# Patient Record
Sex: Female | Born: 1935
Health system: Southern US, Community
[De-identification: ages and names within clinical notes are randomized; demographics above are authoritative.]

## PROBLEM LIST (undated history)

## (undated) DIAGNOSIS — F419 Anxiety disorder, unspecified: Secondary | ICD-10-CM

## (undated) DIAGNOSIS — F0390 Unspecified dementia without behavioral disturbance: Secondary | ICD-10-CM

## (undated) HISTORY — PX: COLON SURGERY: SHX602

## (undated) HISTORY — DX: Unspecified dementia, unspecified severity, without behavioral disturbance, psychotic disturbance, mood disturbance, and anxiety: F03.90

## (undated) HISTORY — DX: Anxiety disorder, unspecified: F41.9

## (undated) HISTORY — PX: JOINT REPLACEMENT: SHX530

---
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
## 1999-05-13 ENCOUNTER — Other Ambulatory Visit: Admission: RE | Admit: 1999-05-13 | Discharge: 1999-05-13 | Payer: Self-pay | Admitting: Obstetrics and Gynecology

## 2000-06-20 ENCOUNTER — Encounter: Admission: RE | Admit: 2000-06-20 | Discharge: 2000-06-20 | Payer: Self-pay | Admitting: Family Medicine

## 2000-06-20 ENCOUNTER — Encounter: Payer: Self-pay | Admitting: Family Medicine

## 2000-11-03 ENCOUNTER — Ambulatory Visit (HOSPITAL_COMMUNITY): Admission: RE | Admit: 2000-11-03 | Discharge: 2000-11-03 | Payer: Self-pay | Admitting: Obstetrics and Gynecology

## 2001-06-26 ENCOUNTER — Encounter: Admission: RE | Admit: 2001-06-26 | Discharge: 2001-06-26 | Payer: Self-pay | Admitting: Family Medicine

## 2001-06-26 ENCOUNTER — Encounter: Payer: Self-pay | Admitting: Family Medicine

## 2002-07-06 ENCOUNTER — Encounter: Payer: Self-pay | Admitting: Family Medicine

## 2002-07-06 ENCOUNTER — Encounter: Admission: RE | Admit: 2002-07-06 | Discharge: 2002-07-06 | Payer: Self-pay | Admitting: Family Medicine

## 2003-07-09 ENCOUNTER — Encounter: Admission: RE | Admit: 2003-07-09 | Discharge: 2003-07-09 | Payer: Self-pay | Admitting: Family Medicine

## 2003-07-09 ENCOUNTER — Encounter: Payer: Self-pay | Admitting: Family Medicine

## 2004-08-28 ENCOUNTER — Ambulatory Visit (HOSPITAL_COMMUNITY): Admission: RE | Admit: 2004-08-28 | Discharge: 2004-08-28 | Payer: Self-pay | Admitting: Family Medicine

## 2011-02-23 ENCOUNTER — Other Ambulatory Visit (HOSPITAL_COMMUNITY): Payer: Self-pay | Admitting: Family Medicine

## 2011-02-23 DIAGNOSIS — Z1231 Encounter for screening mammogram for malignant neoplasm of breast: Secondary | ICD-10-CM

## 2011-03-05 ENCOUNTER — Ambulatory Visit (HOSPITAL_COMMUNITY): Payer: Medicare HMO | Attending: Family Medicine

## 2011-08-09 ENCOUNTER — Emergency Department (HOSPITAL_COMMUNITY)
Admission: EM | Admit: 2011-08-09 | Discharge: 2011-08-10 | Disposition: A | Discharge status: Home / Self Care | Payer: Medicare HMO | Attending: Emergency Medicine | Admitting: Emergency Medicine

## 2011-08-09 DIAGNOSIS — Z79899 Other long term (current) drug therapy: Secondary | ICD-10-CM | POA: Insufficient documentation

## 2011-08-09 DIAGNOSIS — F0390 Unspecified dementia without behavioral disturbance: Secondary | ICD-10-CM | POA: Insufficient documentation

## 2012-06-22 ENCOUNTER — Other Ambulatory Visit: Payer: Self-pay | Admitting: Family Medicine

## 2012-06-22 DIAGNOSIS — Z1231 Encounter for screening mammogram for malignant neoplasm of breast: Secondary | ICD-10-CM

## 2012-06-22 DIAGNOSIS — M810 Age-related osteoporosis without current pathological fracture: Secondary | ICD-10-CM

## 2012-07-11 ENCOUNTER — Ambulatory Visit: Payer: Medicare HMO

## 2012-07-11 ENCOUNTER — Other Ambulatory Visit: Payer: Medicare HMO

## 2012-08-02 ENCOUNTER — Ambulatory Visit
Admission: RE | Admit: 2012-08-02 | Discharge: 2012-08-02 | Disposition: A | Discharge status: Home / Self Care | Payer: Medicare HMO | Source: Ambulatory Visit | Attending: Family Medicine | Admitting: Family Medicine

## 2012-08-02 ENCOUNTER — Other Ambulatory Visit: Payer: Medicare HMO

## 2012-08-02 DIAGNOSIS — M810 Age-related osteoporosis without current pathological fracture: Secondary | ICD-10-CM

## 2012-08-02 DIAGNOSIS — Z1231 Encounter for screening mammogram for malignant neoplasm of breast: Secondary | ICD-10-CM

## 2014-01-01 DIAGNOSIS — D509 Iron deficiency anemia, unspecified: Secondary | ICD-10-CM | POA: Insufficient documentation

## 2014-01-01 DIAGNOSIS — Z79899 Other long term (current) drug therapy: Secondary | ICD-10-CM | POA: Insufficient documentation

## 2014-01-01 DIAGNOSIS — F419 Anxiety disorder, unspecified: Secondary | ICD-10-CM | POA: Insufficient documentation

## 2015-09-22 ENCOUNTER — Ambulatory Visit: Payer: Self-pay | Admitting: Family Medicine

## 2015-10-15 ENCOUNTER — Encounter: Payer: Self-pay | Admitting: Family Medicine

## 2015-10-15 ENCOUNTER — Ambulatory Visit (INDEPENDENT_AMBULATORY_CARE_PROVIDER_SITE_OTHER): Payer: Medicare HMO | Admitting: Family Medicine

## 2015-10-15 ENCOUNTER — Ambulatory Visit (INDEPENDENT_AMBULATORY_CARE_PROVIDER_SITE_OTHER): Payer: Medicare HMO

## 2015-10-15 VITALS — BP 122/72 | HR 80 | Temp 97.9°F | Ht 64.0 in | Wt 144.2 lb

## 2015-10-15 DIAGNOSIS — G4700 Insomnia, unspecified: Secondary | ICD-10-CM | POA: Diagnosis not present

## 2015-10-15 DIAGNOSIS — M545 Low back pain, unspecified: Secondary | ICD-10-CM

## 2015-10-15 DIAGNOSIS — S32000A Wedge compression fracture of unspecified lumbar vertebra, initial encounter for closed fracture: Secondary | ICD-10-CM

## 2015-10-15 DIAGNOSIS — F0391 Unspecified dementia with behavioral disturbance: Secondary | ICD-10-CM

## 2015-10-15 DIAGNOSIS — F329 Major depressive disorder, single episode, unspecified: Secondary | ICD-10-CM | POA: Diagnosis not present

## 2015-10-15 DIAGNOSIS — F0390 Unspecified dementia without behavioral disturbance: Secondary | ICD-10-CM | POA: Insufficient documentation

## 2015-10-15 DIAGNOSIS — F32A Depression, unspecified: Secondary | ICD-10-CM

## 2015-10-15 DIAGNOSIS — F03918 Unspecified dementia, unspecified severity, with other behavioral disturbance: Secondary | ICD-10-CM | POA: Insufficient documentation

## 2015-10-15 MED ORDER — HALOPERIDOL 0.5 MG PO TABS: ORAL_TABLET | ORAL | Status: DC

## 2015-10-15 MED ORDER — MELOXICAM 7.5 MG PO TABS: 7.5000 mg | ORAL_TABLET | Freq: Every day | ORAL | Status: DC

## 2015-10-15 MED ORDER — SERTRALINE HCL 100 MG PO TABS: 100.0000 mg | ORAL_TABLET | Freq: Two times a day (BID) | ORAL | Status: DC

## 2015-10-15 MED ORDER — DONEPEZIL HCL 10 MG PO TABS: 10.0000 mg | ORAL_TABLET | Freq: Every day | ORAL | Status: DC

## 2015-10-15 MED ORDER — TRAZODONE HCL 50 MG PO TABS: ORAL_TABLET | ORAL | Status: DC

## 2015-10-15 NOTE — Assessment & Plan Note (Signed)
Refill medications and further evaluate at next visit. Request records from previous physician

## 2015-10-15 NOTE — Progress Notes (Signed)
BP 122/72 mmHg  Pulse 80  Temp(Src) 97.9 F (36.6 C) (Oral)  Ht 5\' 4"  (1.626 m)  Wt 144 lb 3.2 oz (65.409 kg)  BMI 24.74 kg/m2   Subjective:    Patient ID: Mallory Steele, female    DOB: 1932/06/14, 81 y.o.   MRN: [id number]  HPI: Mallory Steele is a 81 y.o. female presenting on 10/15/2015 for New Patient (Initial Visit); Fall; and Medication Refill   HPI Fall and back pain Patient comes in today as a new patient for a fall and back pain that is persistent and is not improving despite Tylenol use. Her daughter is with her today and is giving a lot of the history because she has dementia to where she can't remember things. She denies any pain shooting down her legs or tingling or numbness in her legs or weakness. The pain is midline and lower lumbar/sacral. She fell on her buttocks when she fell a week ago. She said she was trying to grab something off a top shelf and as she was pulling it out she fell backwards.  Dementia and behavioral problems and insomnia Patient also has a history of dementia and behavioral problems and insomnia for which she needs a refill today. We will do a further reevaluation of this when she comes for her full exam but did not want her to run out of medication so we'll do a refill at this point. Her daughter she's been doing really well on these medications.  Relevant past medical, surgical, family and social history reviewed and updated as indicated. Interim medical history since our last visit reviewed. Allergies and medications reviewed and updated.  Review of Systems  Constitutional: Negative for fever and chills.  HENT: Negative for congestion, ear discharge and ear pain.   Eyes: Negative for redness and visual disturbance.  Respiratory: Negative for chest tightness and shortness of breath.   Cardiovascular: Negative for chest pain and leg swelling.  Genitourinary: Negative for dysuria and difficulty urinating.  Musculoskeletal: Positive for back  pain. Negative for gait problem.  Skin: Negative for rash.  Neurological: Negative for light-headedness and headaches.  Psychiatric/Behavioral: Positive for confusion, sleep disturbance and decreased concentration. Negative for suicidal ideas, behavioral problems, self-injury, dysphoric mood and agitation. The patient is not nervous/anxious.   All other systems reviewed and are negative.   Per HPI unless specifically indicated above  Social History   Social History  . Marital Status: Widowed    Spouse Name: N/A  . Number of Children: N/A  . Years of Education: N/A   Occupational History  . Not on file.   Social History Main Topics  . Smoking status: Former Smoker -- 0.25 packs/day for 3 years    Quit date: 09/22/1985  . Smokeless tobacco: Never Used  . Alcohol Use: 0.6 oz/week    1 Glasses of wine per week  . Drug Use: No  . Sexual Activity: No   Other Topics Concern  . Not on file   Social History Narrative    Past Surgical History  Procedure Laterality Date  . Appendectomy  1947  . Colon surgery  [phone]  . Joint replacement      knee replaced    Family History  Problem Relation Age of Onset  . Arthritis Mother   . Cancer Mother   . Arthritis Father   . Asthma Father   . COPD Father   . Arthritis Sister   . Birth defects Sister   .  Diabetes Sister   . Hearing loss Sister   . Mental retardation Sister   . Arthritis Brother   . Early death Brother   . Heart disease Brother   . Hypertension Brother       Medication List       This list is accurate as of: 10/15/15  2:59 PM.  Always use your most recent med list.               donepezil 10 MG tablet  Commonly known as:  ARICEPT  Take 1 tablet (10 mg total) by mouth daily.     haloperidol 0.5 MG tablet  Commonly known as:  HALDOL  TAKE 1 TABLET TWICE A DAY AS NEEDED FOR SEDATION     meloxicam 7.5 MG tablet  Commonly known as:  MOBIC  Take 1 tablet (7.5 mg total) by mouth daily.      sertraline 100 MG tablet  Commonly known as:  ZOLOFT  Take 1 tablet (100 mg total) by mouth 2 (two) times daily.     traZODone 50 MG tablet  Commonly known as:  DESYREL  TAKE 1 TO 2 TABLETS AT BEDTIME AS NEEDED FOR SLEEP           Objective:    BP 122/72 mmHg  Pulse 80  Temp(Src) 97.9 F (36.6 C) (Oral)  Ht 5\' 4"  (1.626 m)  Wt 144 lb 3.2 oz (65.409 kg)  BMI 24.74 kg/m2  Wt Readings from Last 3 Encounters:  10/15/15 144 lb 3.2 oz (65.409 kg)    Physical Exam  Constitutional: She appears well-developed and well-nourished. No distress.  Eyes: Conjunctivae and EOM are normal. Pupils are equal, round, and reactive to light.  Cardiovascular: Normal rate, regular rhythm, normal heart sounds and intact distal pulses.   No murmur heard. Pulmonary/Chest: Effort normal and breath sounds normal. No respiratory distress. She has no wheezes.  Musculoskeletal: Normal range of motion. She exhibits no edema.       Lumbar back: She exhibits tenderness and bony tenderness (midline tenderness over L5-S1 region, negative straight leg raise bilaterally.). She exhibits normal range of motion, no swelling, no edema, no deformity and no spasm.  Neurological: She is alert. Coordination normal.  Skin: Skin is warm and dry. No rash noted. She is not diaphoretic.  Psychiatric: She has a normal mood and affect. Her speech is normal and behavior is normal. Judgment and thought content normal.  Nursing note and vitals reviewed.  Lumbar x-ray: X-ray appears to show 3 levels of compression fracture including L2 L3 L4, await final read by radiology. No results found for this or any previous visit.    Assessment & Plan:   Problem List Items Addressed This Visit      Nervous and Auditory   Dementia    Refill medications and further evaluate at next visit. Request records from previous physician      Relevant Medications   traZODone (DESYREL) 50 MG tablet   sertraline (ZOLOFT) 100 MG tablet    haloperidol (HALDOL) 0.5 MG tablet   donepezil (ARICEPT) 10 MG tablet     Other   Insomnia    Refill medications and further evaluate at next visit. Request records from previous physician      Relevant Medications   traZODone (DESYREL) 50 MG tablet   Depression    Refill medications and further evaluate at next visit. Request records from previous physician      Relevant Medications   traZODone (DESYREL) 50  MG tablet   sertraline (ZOLOFT) 100 MG tablet    Other Visit Diagnoses    Midline low back pain without sciatica    -  Primary    Concern for possible compression fracture and will do x-ray of lower spine.    Relevant Medications    meloxicam (MOBIC) 7.5 MG tablet    Other Relevant Orders    DG Lumbar Spine 2-3 Views (Completed)    Ambulatory referral to Orthopedic Surgery    Compression fracture of lumbar vertebra, closed, initial encounter ([facility])        Based on x-ray and no previous imaging we do not know if this is a new or old injury but with a recent fall and pain we will send to orthopedic    Relevant Orders    Ambulatory referral to Orthopedic Surgery        Follow up plan: Return if symptoms worsen or fail to improve.  Caryl Pina, MD [facility] 10/15/2015, 2:59 PM

## 2015-11-14 ENCOUNTER — Ambulatory Visit: Payer: Medicare HMO | Admitting: Family Medicine

## 2015-12-02 ENCOUNTER — Ambulatory Visit: Payer: Medicare HMO | Admitting: Family Medicine

## 2016-02-12 ENCOUNTER — Other Ambulatory Visit: Payer: Self-pay | Admitting: Family Medicine

## 2016-02-12 NOTE — Telephone Encounter (Signed)
Last seen 10/15/15 Dr Dettinger

## 2016-02-26 ENCOUNTER — Other Ambulatory Visit: Payer: Self-pay

## 2016-02-26 DIAGNOSIS — F0391 Unspecified dementia with behavioral disturbance: Secondary | ICD-10-CM

## 2016-02-26 MED ORDER — DONEPEZIL HCL 10 MG PO TABS: 10.0000 mg | ORAL_TABLET | Freq: Every day | ORAL | Status: DC

## 2016-04-19 ENCOUNTER — Other Ambulatory Visit: Payer: Self-pay | Admitting: Family Medicine

## 2016-04-20 ENCOUNTER — Ambulatory Visit: Payer: Medicare HMO | Admitting: Family Medicine

## 2016-04-20 NOTE — Telephone Encounter (Signed)
Patient needs an appointment before next refill

## 2016-04-29 ENCOUNTER — Ambulatory Visit (INDEPENDENT_AMBULATORY_CARE_PROVIDER_SITE_OTHER): Payer: Medicare HMO | Admitting: Family Medicine

## 2016-04-29 ENCOUNTER — Other Ambulatory Visit: Payer: Self-pay | Admitting: *Deleted

## 2016-04-29 ENCOUNTER — Encounter: Payer: Self-pay | Admitting: Family Medicine

## 2016-04-29 VITALS — BP 127/68 | HR 66 | Temp 97.0°F | Ht 64.0 in | Wt 144.8 lb

## 2016-04-29 DIAGNOSIS — F329 Major depressive disorder, single episode, unspecified: Secondary | ICD-10-CM | POA: Diagnosis not present

## 2016-04-29 DIAGNOSIS — F0391 Unspecified dementia with behavioral disturbance: Secondary | ICD-10-CM

## 2016-04-29 DIAGNOSIS — Z13220 Encounter for screening for lipoid disorders: Secondary | ICD-10-CM

## 2016-04-29 DIAGNOSIS — Z131 Encounter for screening for diabetes mellitus: Secondary | ICD-10-CM | POA: Diagnosis not present

## 2016-04-29 DIAGNOSIS — F32A Depression, unspecified: Secondary | ICD-10-CM

## 2016-04-29 MED ORDER — HALOPERIDOL 0.5 MG PO TABS: ORAL_TABLET | ORAL | Status: DC

## 2016-04-30 ENCOUNTER — Telehealth: Payer: Self-pay | Admitting: *Deleted

## 2016-04-30 DIAGNOSIS — T496X1A Poisoning by otorhinolaryngological drugs and preparations, accidental (unintentional), initial encounter: Secondary | ICD-10-CM | POA: Diagnosis not present

## 2016-04-30 DIAGNOSIS — Z87891 Personal history of nicotine dependence: Secondary | ICD-10-CM | POA: Diagnosis not present

## 2016-04-30 DIAGNOSIS — Z79899 Other long term (current) drug therapy: Secondary | ICD-10-CM | POA: Diagnosis not present

## 2016-04-30 DIAGNOSIS — H10211 Acute toxic conjunctivitis, right eye: Secondary | ICD-10-CM | POA: Diagnosis not present

## 2016-04-30 LAB — CMP14+EGFR
ALT: 11 IU/L (ref 0–32)
AST: 21 IU/L (ref 0–40)
Albumin/Globulin Ratio: 1.7 (ref 1.2–2.2)
Albumin: 4.3 g/dL (ref 3.5–4.7)
Alkaline Phosphatase: 161 IU/L — ABNORMAL HIGH (ref 39–117)
BUN/Creatinine Ratio: 12 (ref 12–28)
BUN: 14 mg/dL (ref 8–27)
Bilirubin Total: <0.2 mg/dL (ref 0.0–1.2)
CALCIUM: 8.5 mg/dL — AB (ref 8.7–10.3)
CO2: 19 mmol/L (ref 18–29)
CREATININE: 1.14 mg/dL — AB (ref 0.57–1.00)
Chloride: 104 mmol/L (ref 96–106)
GFR calc Af Amer: 52 mL/min/{1.73_m2} — ABNORMAL LOW (ref >59)
GFR, EST NON AFRICAN AMERICAN: 45 mL/min/{1.73_m2} — AB (ref >59)
GLOBULIN, TOTAL: 2.5 g/dL (ref 1.5–4.5)
GLUCOSE: 76 mg/dL (ref 65–99)
Potassium: 4.9 mmol/L (ref 3.5–5.2)
SODIUM: 138 mmol/L (ref 134–144)
Total Protein: 6.8 g/dL (ref 6.0–8.5)

## 2016-04-30 LAB — LIPID PANEL
CHOL/HDL RATIO: 1.8 ratio (ref 0.0–4.4)
CHOLESTEROL TOTAL: 106 mg/dL (ref 100–199)
HDL: 60 mg/dL (ref >39)
LDL CALC: 23 mg/dL (ref 0–99)
TRIGLYCERIDES: 115 mg/dL (ref 0–149)
VLDL CHOLESTEROL CAL: 23 mg/dL (ref 5–40)

## 2016-04-30 LAB — CBC WITH DIFFERENTIAL/PLATELET
BASOS: 0 %
Basophils Absolute: 0 10*3/uL (ref 0.0–0.2)
EOS (ABSOLUTE): 0.1 10*3/uL (ref 0.0–0.4)
EOS: 1 %
HEMATOCRIT: 36.2 % (ref 34.0–46.6)
HEMOGLOBIN: 11.3 g/dL (ref 11.1–15.9)
Immature Grans (Abs): 0 10*3/uL (ref 0.0–0.1)
Immature Granulocytes: 0 %
LYMPHS ABS: 2.2 10*3/uL (ref 0.7–3.1)
Lymphs: 30 %
MCH: 27.7 pg (ref 26.6–33.0)
MCHC: 31.2 g/dL — AB (ref 31.5–35.7)
MCV: 89 fL (ref 79–97)
MONOCYTES: 10 %
MONOS ABS: 0.7 10*3/uL (ref 0.1–0.9)
NEUTROS ABS: 4.1 10*3/uL (ref 1.4–7.0)
Neutrophils: 59 %
Platelets: 188 10*3/uL (ref 150–379)
RBC: 4.08 x10E6/uL (ref 3.77–5.28)
RDW: 15.2 % (ref 12.3–15.4)
WBC: 7.1 10*3/uL (ref 3.4–10.8)

## 2016-04-30 LAB — TSH: TSH: 2.1 u[IU]/mL (ref 0.450–4.500)

## 2016-04-30 NOTE — Telephone Encounter (Signed)
Patients daughter called stating that her mother has been using ear drops in her eyes for that last few days and her eyes are red and swollen. Daughter advised that she should take her to the eye Dr for them to evaluate patients eyes.

## 2016-05-04 NOTE — Progress Notes (Signed)
BP 127/68 mmHg  Pulse 66  Temp(Src) 97 F (36.1 C) (Oral)  Ht '5\' 4"'  (1.626 m)  Wt 144 lb 12.8 oz (65.681 kg)  BMI 24.84 kg/m2   Subjective:    Patient ID: Mallory Steele, female    DOB: August 23, 1932, 82 y.o.   MRN: [id number]  HPI: Mallory Steele is a 82 y.o. female presenting on 04/29/2016 for Establish Care   HPI Depression and anxiety Patient comes in to establish care today for anxiety and depression. She has also had dementia and has been on Zoloft and trazodone and Haldol and Aricept. She has been doing well on these medications per family that is here with her and denies any major issues. He suicidal ideations or thoughts of hurting herself. Patient also comes in today for screening labs. She also takes trazodone for sleep but it seems to be doing pretty well for her right now.  Relevant past medical, surgical, family and social history reviewed and updated as indicated. Interim medical history since our last visit reviewed. Allergies and medications reviewed and updated.  Review of Systems  Constitutional: Negative for fever and chills.  HENT: Negative for congestion, ear discharge and ear pain.   Eyes: Negative for redness and visual disturbance.  Respiratory: Negative for chest tightness and shortness of breath.   Cardiovascular: Negative for chest pain and leg swelling.  Genitourinary: Negative for dysuria and difficulty urinating.  Musculoskeletal: Negative for back pain and gait problem.  Skin: Negative for rash.  Neurological: Negative for dizziness, light-headedness and headaches.  Psychiatric/Behavioral: Positive for sleep disturbance, dysphoric mood and decreased concentration. Negative for suicidal ideas, behavioral problems, self-injury and agitation. The patient is nervous/anxious.   All other systems reviewed and are negative.   Per HPI unless specifically indicated above     Medication List       This list is accurate as of: 04/29/16 11:59 PM.  Always use  your most recent med list.               donepezil 10 MG tablet  Commonly known as:  ARICEPT  Take 1 tablet (10 mg total) by mouth daily.     haloperidol 0.5 MG tablet  Commonly known as:  HALDOL  TAKE 1 TABLET TWICE A DAY AS NEEDED FOR SEDATION     meloxicam 7.5 MG tablet  Commonly known as:  MOBIC  Take 1 tablet (7.5 mg total) by mouth daily.     sertraline 100 MG tablet  Commonly known as:  ZOLOFT  TAKE 1 TABLET (100 MG TOTAL) BY MOUTH 2 (TWO) TIMES DAILY.     traZODone 50 MG tablet  Commonly known as:  DESYREL  TAKE 1 TO 2 TABLETS AT BEDTIME AS NEEDED FOR SLEEP           Objective:    BP 127/68 mmHg  Pulse 66  Temp(Src) 97 F (36.1 C) (Oral)  Ht '5\' 4"'  (1.626 m)  Wt 144 lb 12.8 oz (65.681 kg)  BMI 24.84 kg/m2  Wt Readings from Last 3 Encounters:  04/29/16 144 lb 12.8 oz (65.681 kg)  10/15/15 144 lb 3.2 oz (65.409 kg)    Physical Exam  Constitutional: She is oriented to person, place, and time. She appears well-developed and well-nourished. No distress.  Eyes: Conjunctivae and EOM are normal. Pupils are equal, round, and reactive to light.  Neck: Neck supple. No thyromegaly present.  Cardiovascular: Normal rate, regular rhythm, normal heart sounds and intact distal pulses.  No murmur heard. Pulmonary/Chest: Effort normal and breath sounds normal. No respiratory distress. She has no wheezes.  Musculoskeletal: Normal range of motion. She exhibits no edema or tenderness.  Lymphadenopathy:    She has no cervical adenopathy.  Neurological: She is alert and oriented to person, place, and time. Coordination normal.  Skin: Skin is warm and dry. No rash noted. She is not diaphoretic.  Psychiatric: Her behavior is normal. Thought content normal. Her mood appears anxious. She exhibits a depressed mood. She expresses no suicidal ideation. She expresses no suicidal plans.  Nursing note and vitals reviewed.     Assessment & Plan:   Problem List Items Addressed This  Visit      Other   Depression - Primary   Relevant Orders   TSH (Completed)   CBC with Differential/Platelet (Completed)    Other Visit Diagnoses    Screening for diabetes mellitus        Relevant Orders    CMP14+EGFR (Completed)    Screening for cholesterol level        Relevant Orders    Lipid panel (Completed)        Follow up plan: No Follow-up on file.  Counseling provided for all of the vaccine components Orders Placed This Encounter  Procedures  . CMP14+EGFR  . Lipid panel  . TSH  . CBC with Differential/Platelet    Caryl Pina, MD [facility] 04/29/2016, 4:58 AM

## 2016-05-06 DIAGNOSIS — H2513 Age-related nuclear cataract, bilateral: Secondary | ICD-10-CM | POA: Diagnosis not present

## 2016-05-06 DIAGNOSIS — H25813 Combined forms of age-related cataract, bilateral: Secondary | ICD-10-CM | POA: Diagnosis not present

## 2016-05-06 DIAGNOSIS — H40033 Anatomical narrow angle, bilateral: Secondary | ICD-10-CM | POA: Diagnosis not present

## 2016-05-30 ENCOUNTER — Other Ambulatory Visit: Payer: Self-pay | Admitting: Family Medicine

## 2016-06-07 DIAGNOSIS — H25813 Combined forms of age-related cataract, bilateral: Secondary | ICD-10-CM | POA: Diagnosis not present

## 2016-06-18 ENCOUNTER — Ambulatory Visit: Payer: Medicare HMO | Admitting: Pediatrics

## 2016-06-26 ENCOUNTER — Other Ambulatory Visit: Payer: Self-pay | Admitting: Family Medicine

## 2016-06-26 DIAGNOSIS — F0391 Unspecified dementia with behavioral disturbance: Secondary | ICD-10-CM

## 2016-06-28 DIAGNOSIS — H25811 Combined forms of age-related cataract, right eye: Secondary | ICD-10-CM | POA: Diagnosis not present

## 2016-06-28 DIAGNOSIS — H2511 Age-related nuclear cataract, right eye: Secondary | ICD-10-CM | POA: Diagnosis not present

## 2016-07-18 ENCOUNTER — Other Ambulatory Visit: Payer: Self-pay | Admitting: Family Medicine

## 2016-08-02 ENCOUNTER — Other Ambulatory Visit: Payer: Self-pay | Admitting: Family Medicine

## 2016-08-02 DIAGNOSIS — F0391 Unspecified dementia with behavioral disturbance: Secondary | ICD-10-CM

## 2016-08-07 ENCOUNTER — Other Ambulatory Visit: Payer: Self-pay | Admitting: Family Medicine

## 2016-08-07 DIAGNOSIS — F0391 Unspecified dementia with behavioral disturbance: Secondary | ICD-10-CM

## 2016-09-08 ENCOUNTER — Other Ambulatory Visit: Payer: Self-pay | Admitting: Family Medicine

## 2016-09-08 ENCOUNTER — Other Ambulatory Visit: Payer: Self-pay

## 2016-09-08 DIAGNOSIS — F0391 Unspecified dementia with behavioral disturbance: Secondary | ICD-10-CM

## 2016-09-08 DIAGNOSIS — F03918 Unspecified dementia, unspecified severity, with other behavioral disturbance: Secondary | ICD-10-CM

## 2016-09-08 MED ORDER — SERTRALINE HCL 100 MG PO TABS: ORAL_TABLET | ORAL | 0 refills | Status: DC

## 2016-09-18 ENCOUNTER — Other Ambulatory Visit: Payer: Self-pay | Admitting: Family Medicine

## 2016-09-18 DIAGNOSIS — F0391 Unspecified dementia with behavioral disturbance: Secondary | ICD-10-CM

## 2016-09-18 DIAGNOSIS — F03918 Unspecified dementia, unspecified severity, with other behavioral disturbance: Secondary | ICD-10-CM

## 2016-10-06 ENCOUNTER — Ambulatory Visit (INDEPENDENT_AMBULATORY_CARE_PROVIDER_SITE_OTHER): Payer: Commercial Managed Care - HMO | Admitting: Family Medicine

## 2016-10-06 ENCOUNTER — Encounter: Payer: Self-pay | Admitting: Family Medicine

## 2016-10-06 VITALS — BP 128/69 | HR 65 | Temp 97.9°F | Ht 64.0 in | Wt 145.5 lb

## 2016-10-06 DIAGNOSIS — H00014 Hordeolum externum left upper eyelid: Secondary | ICD-10-CM | POA: Diagnosis not present

## 2016-10-06 MED ORDER — CEPHALEXIN 500 MG PO CAPS: 500.0000 mg | ORAL_CAPSULE | Freq: Two times a day (BID) | ORAL | 0 refills | Status: DC

## 2016-10-06 NOTE — Progress Notes (Signed)
BP 128/69   Pulse 65   Temp 97.9 F (36.6 C) (Oral)   Ht 5\' 4"  (1.626 m)   Wt 145 lb 8 oz (66 kg)   BMI 24.98 kg/m    Subjective:    Patient ID: Mallory Steele, female    DOB: 1931/11/23, 82 y.o.   MRN: [id number]  HPI: Mallory Steele is a 82 y.o. female presenting on 10/06/2016 for Stye on left eye (has been putting warm compresses and a heated eye mask on it)   HPI Left eye stye Patient comes in today with a stye on her left upper eyelid that's been there for about a week. She has been using warm compresses and heated eye mask on it to see if it would open up but it does not seem to want to. She denies any fevers or chills or visual issues. She denies any pain with eye movement. The area of erythema and swelling is starting to get larger on the eyelid and that's what brought him in today.  Relevant past medical, surgical, family and social history reviewed and updated as indicated. Interim medical history since our last visit reviewed. Allergies and medications reviewed and updated.  Review of Systems  Constitutional: Negative for chills and fever.  HENT: Negative for congestion, ear discharge and ear pain.   Eyes: Negative for pain, discharge, redness and visual disturbance.  Respiratory: Negative for chest tightness and shortness of breath.   Cardiovascular: Negative for chest pain and leg swelling.  Genitourinary: Negative for difficulty urinating and dysuria.  Musculoskeletal: Negative for back pain and gait problem.  Skin: Negative for rash.  Neurological: Negative for light-headedness and headaches.  Psychiatric/Behavioral: Negative for agitation and behavioral problems.  All other systems reviewed and are negative.   Per HPI unless specifically indicated above     Objective:    BP 128/69   Pulse 65   Temp 97.9 F (36.6 C) (Oral)   Ht 5\' 4"  (1.626 m)   Wt 145 lb 8 oz (66 kg)   BMI 24.98 kg/m   Wt Readings from Last 3 Encounters:  10/06/16 145 lb 8 oz (66  kg)  04/29/16 144 lb 12.8 oz (65.7 kg)  10/15/15 144 lb 3.2 oz (65.4 kg)    Physical Exam  Constitutional: She is oriented to person, place, and time. She appears well-developed and well-nourished. No distress.  Eyes: Conjunctivae and EOM are normal. Pupils are equal, round, and reactive to light. Right eye exhibits no discharge and no hordeolum. Left eye exhibits hordeolum. Left eye exhibits no discharge.    Cardiovascular: Normal rate, regular rhythm, normal heart sounds and intact distal pulses.   No murmur heard. Pulmonary/Chest: Effort normal and breath sounds normal. No respiratory distress. She has no wheezes.  Musculoskeletal: Normal range of motion. She exhibits no edema or tenderness.  Neurological: She is alert and oriented to person, place, and time. Coordination normal.  Skin: Skin is warm and dry. No rash noted. She is not diaphoretic.  Psychiatric: She has a normal mood and affect. Her behavior is normal.  Nursing note and vitals reviewed.     Assessment & Plan:   Problem List Items Addressed This Visit    None    Visit Diagnoses    Hordeolum externum of left upper eyelid    -  Primary   Hyzaar tried warm compresses for 2 weeks, will send antibiotics, starting to develop into a cellulitis.   Relevant Medications   cephALEXin (KEFLEX)  500 MG capsule       Follow up plan: Return if symptoms worsen or fail to improve.  Counseling provided for all of the vaccine components No orders of the defined types were placed in this encounter.   Caryl Pina, MD [facility] 10/06/2016, 2:59 PM

## 2016-10-07 ENCOUNTER — Emergency Department (HOSPITAL_COMMUNITY)
Admission: EM | Admit: 2016-10-07 | Discharge: 2016-10-07 | Disposition: A | Discharge status: Home / Self Care | Payer: Commercial Managed Care - HMO | Attending: Emergency Medicine | Admitting: Emergency Medicine

## 2016-10-07 ENCOUNTER — Emergency Department (HOSPITAL_COMMUNITY): Payer: Commercial Managed Care - HMO

## 2016-10-07 ENCOUNTER — Encounter (HOSPITAL_COMMUNITY): Payer: Self-pay | Admitting: Emergency Medicine

## 2016-10-07 DIAGNOSIS — S199XXA Unspecified injury of neck, initial encounter: Secondary | ICD-10-CM | POA: Diagnosis not present

## 2016-10-07 DIAGNOSIS — R930 Abnormal findings on diagnostic imaging of skull and head, not elsewhere classified: Secondary | ICD-10-CM | POA: Insufficient documentation

## 2016-10-07 DIAGNOSIS — W010XXA Fall on same level from slipping, tripping and stumbling without subsequent striking against object, initial encounter: Secondary | ICD-10-CM | POA: Diagnosis not present

## 2016-10-07 DIAGNOSIS — T148XXA Other injury of unspecified body region, initial encounter: Secondary | ICD-10-CM | POA: Diagnosis not present

## 2016-10-07 DIAGNOSIS — Z96659 Presence of unspecified artificial knee joint: Secondary | ICD-10-CM | POA: Insufficient documentation

## 2016-10-07 DIAGNOSIS — S0990XA Unspecified injury of head, initial encounter: Secondary | ICD-10-CM | POA: Diagnosis not present

## 2016-10-07 DIAGNOSIS — S42211A Unspecified displaced fracture of surgical neck of right humerus, initial encounter for closed fracture: Secondary | ICD-10-CM | POA: Diagnosis not present

## 2016-10-07 DIAGNOSIS — S42201A Unspecified fracture of upper end of right humerus, initial encounter for closed fracture: Secondary | ICD-10-CM | POA: Insufficient documentation

## 2016-10-07 DIAGNOSIS — S0081XA Abrasion of other part of head, initial encounter: Secondary | ICD-10-CM | POA: Insufficient documentation

## 2016-10-07 DIAGNOSIS — Y999 Unspecified external cause status: Secondary | ICD-10-CM | POA: Insufficient documentation

## 2016-10-07 DIAGNOSIS — Y929 Unspecified place or not applicable: Secondary | ICD-10-CM | POA: Insufficient documentation

## 2016-10-07 DIAGNOSIS — S4991XA Unspecified injury of right shoulder and upper arm, initial encounter: Secondary | ICD-10-CM | POA: Diagnosis present

## 2016-10-07 DIAGNOSIS — Y939 Activity, unspecified: Secondary | ICD-10-CM | POA: Insufficient documentation

## 2016-10-07 DIAGNOSIS — Z87891 Personal history of nicotine dependence: Secondary | ICD-10-CM | POA: Diagnosis not present

## 2016-10-07 DIAGNOSIS — M542 Cervicalgia: Secondary | ICD-10-CM | POA: Diagnosis not present

## 2016-10-07 DIAGNOSIS — M25511 Pain in right shoulder: Secondary | ICD-10-CM | POA: Diagnosis not present

## 2016-10-07 DIAGNOSIS — I10 Essential (primary) hypertension: Secondary | ICD-10-CM | POA: Diagnosis not present

## 2016-10-07 DIAGNOSIS — S0993XA Unspecified injury of face, initial encounter: Secondary | ICD-10-CM | POA: Diagnosis not present

## 2016-10-07 MED ORDER — FENTANYL CITRATE (PF) 100 MCG/2ML IJ SOLN
50.0000 ug | Freq: Once | INTRAMUSCULAR | Status: AC
  Administered 2016-10-07: 50 ug via INTRAVENOUS
  Filled 2016-10-07: qty 2

## 2016-10-07 MED ORDER — HYDROCODONE-ACETAMINOPHEN 5-325 MG PO TABS: 1.0000 | ORAL_TABLET | Freq: Four times a day (QID) | ORAL | 0 refills | Status: DC | PRN

## 2016-10-07 MED ORDER — HYDROCODONE-ACETAMINOPHEN 5-325 MG PO TABS
1.0000 | ORAL_TABLET | Freq: Once | ORAL | Status: AC
  Administered 2016-10-07: 1 via ORAL
  Filled 2016-10-07: qty 1

## 2016-10-07 NOTE — ED Notes (Signed)
Patient transported to X-ray 

## 2016-10-07 NOTE — ED Notes (Signed)
ED Provider at bedside. 

## 2016-10-07 NOTE — ED Provider Notes (Signed)
[facility] DEPT Provider Note   CSN: [id number] Arrival date & time: 10/07/16  1345     History   Chief Complaint Chief Complaint  Patient presents with  . Shoulder Injury    HPI Mallory Steele is a 82 y.o. female.  HPI Patient presents after trip and fall. States she tripped over her friend's oxygen tank and landed on the floor. Denies loss of consciousness. Complains of facial pain and momentary epistaxis which is resolved. She also complains of right shoulder and upper arm pain. EMS arrived and placed patient in cervical collar and sling. Patient denies any numbness, tingling or weakness. She was in her normal state of health prior to the fall. Past Medical History:  Diagnosis Date  . Anxiety   . Dementia     Patient Active Problem List   Diagnosis Date Noted  . Closed fracture of right proximal humerus 10/12/2016  . Dementia 10/15/2015  . Insomnia 10/15/2015  . Depression 10/15/2015    Past Surgical History:  Procedure Laterality Date  . APPENDECTOMY  1947  . COLON SURGERY  [phone]  . JOINT REPLACEMENT     knee replaced    OB History    No data available       Home Medications    Prior to Admission medications   Medication Sig Start Date End Date Taking? Authorizing Provider  cephALEXin (KEFLEX) 500 MG capsule Take 1 capsule (500 mg total) by mouth 2 (two) times daily. Patient not taking: Reported on 10/12/2016 10/06/16  Yes Fransisca Kaufmann Dettinger, MD  donepezil (ARICEPT) 10 MG tablet TAKE 1 TABLET EVERY DAY Patient taking differently: TAKE 1 TABLET BY MOUTH EVERY DAY 09/20/16  Yes Fransisca Kaufmann Dettinger, MD  haloperidol (HALDOL) 0.5 MG tablet TAKE 1 TABLET TWICE A DAY AS NEEDED FOR SEDATION Patient taking differently: TAKE 1 TABLET BY MOUTH DAILY FOR SEDATION 09/09/16  Yes Fransisca Kaufmann Dettinger, MD  sertraline (ZOLOFT) 100 MG tablet TAKE 1 TABLET (100 MG TOTAL) BY MOUTH 2 (TWO) TIMES DAILY. Patient taking differently: Take 100 mg by mouth daily.   09/08/16  Yes Fransisca Kaufmann Dettinger, MD  haloperidol (HALDOL) 0.5 MG tablet TAKE 1 TABLET TWICE A DAY AS NEEDED FOR SEDATION 08/09/16   Fransisca Kaufmann Dettinger, MD  HYDROcodone-acetaminophen (NORCO) 5-325 MG tablet Take 1-2 tablets by mouth every 6 (six) hours as needed for severe pain. 10/12/16   Leandrew Koyanagi, MD  meloxicam (MOBIC) 7.5 MG tablet Take 1 tablet (7.5 mg total) by mouth daily. 10/15/15   Fransisca Kaufmann Dettinger, MD  traZODone (DESYREL) 50 MG tablet TAKE 1 TO 2 TABLETS AT BEDTIME AS NEEDED FOR SLEEP 10/13/16   Fransisca Kaufmann Dettinger, MD    Family History Family History  Problem Relation Age of Onset  . Arthritis Mother   . Cancer Mother   . Arthritis Father   . Asthma Father   . COPD Father   . Arthritis Sister   . Birth defects Sister   . Diabetes Sister   . Hearing loss Sister   . Mental retardation Sister   . Arthritis Brother   . Early death Brother   . Heart disease Brother   . Hypertension Brother     Social History Social History  Substance Use Topics  . Smoking status: Former Smoker    Packs/day: 0.25    Years: 3.00    Quit date: 09/22/1985  . Smokeless tobacco: Never Used  . Alcohol use 0.6 oz/week    1 Glasses of wine  per week     Allergies   Patient has no known allergies.   Review of Systems Review of Systems  Constitutional: Negative for chills and fever.  HENT: Positive for facial swelling and nosebleeds.   Eyes: Negative for visual disturbance.  Respiratory: Negative for shortness of breath.   Cardiovascular: Negative for chest pain.  Gastrointestinal: Negative for abdominal pain, nausea and vomiting.  Musculoskeletal: Positive for arthralgias. Negative for myalgias, neck pain and neck stiffness.  Skin: Positive for wound. Negative for rash.  Neurological: Negative for dizziness, syncope, weakness, light-headedness, numbness and headaches.  All other systems reviewed and are negative.    Physical Exam Updated Vital Signs BP 126/59 (BP Location:  Left Arm)   Pulse 70   Temp 98.1 F (36.7 C)   Resp 16   Ht 5\' 5"  (1.651 m)   Wt 145 lb (65.8 kg)   SpO2 96%   BMI 24.13 kg/m   Physical Exam  Constitutional: She is oriented to person, place, and time. She appears well-developed and well-nourished. No distress.  HENT:  Head: Normocephalic.  Mouth/Throat: Oropharynx is clear and moist.  Patient has abrasion to her forehead. Her swelling at the base of her nose with dried blood in both nares. Tenderness palpation over the nasal bone. Midface is stable. No malocclusion.  Eyes: EOM are normal. Pupils are equal, round, and reactive to light.  Neck:  Cervical collar is in place.  Cardiovascular: Normal rate and regular rhythm.   Pulmonary/Chest: Effort normal and breath sounds normal.  Abdominal: Soft. Bowel sounds are normal. There is no tenderness. There is no rebound and no guarding.  Musculoskeletal: Normal range of motion. She exhibits tenderness. She exhibits no edema.  Patient has tenderness to palpation over the right shoulder and proximal humerus. No step-offs. 2+ radial pulses bilaterally. Pelvis is stable.  Neurological: She is alert and oriented to person, place, and time.  5/5 motor in all extremities. Sensation is fully intact.  Skin: Skin is warm and dry. Capillary refill takes less than 2 seconds. No rash noted. No erythema.  Psychiatric: She has a normal mood and affect. Her behavior is normal.  Nursing note and vitals reviewed.    ED Treatments / Results  Labs (all labs ordered are listed, but only abnormal results are displayed) Labs Reviewed - No data to display  EKG  EKG Interpretation None       Radiology No results found.  Procedures Procedures (including critical care time)  Medications Ordered in ED Medications  fentaNYL (SUBLIMAZE) injection 50 mcg (50 mcg Intravenous Given 10/07/16 1425)  HYDROcodone-acetaminophen (NORCO/VICODIN) 5-325 MG per tablet 1 tablet (1 tablet Oral Given 10/07/16  1632)     Initial Impression / Assessment and Plan / ED Course  I have reviewed the triage vital signs and the nursing notes.  Pertinent labs & imaging results that were available during my care of the patient were reviewed by me and considered in my medical decision making (see chart for details).  Clinical Course    Placed in sling for proximal humerus fracture. CT head and neck without evidence of acute injury. We'll discharge home to follow-up with orthopedics. Head injury precautions given. Patient maintains a normal neurologic exam and is at her mental baseline.   Final Clinical Impressions(s) / ED Diagnoses   Final diagnoses:  Closed fracture of proximal end of right humerus, unspecified fracture morphology, initial encounter  Closed head injury, initial encounter    New Prescriptions Discharge Medication List as  of 10/07/2016  5:06 PM    START taking these medications   Details  HYDROcodone-acetaminophen (NORCO) 5-325 MG tablet Take 1-2 tablets by mouth every 6 (six) hours as needed for severe pain., Starting Thu 10/07/2016, Print         Julianne Rice, MD 10/13/16 [phone]

## 2016-10-07 NOTE — ED Triage Notes (Addendum)
Per EMS, pt from home c/o right arm pain and neck pain after fall. Shoulder appears to be dislocated. Clavicle intact. Denies LOC. Hx dementia. Denies blood thinners.

## 2016-10-11 ENCOUNTER — Other Ambulatory Visit: Payer: Self-pay | Admitting: Family Medicine

## 2016-10-12 ENCOUNTER — Encounter (INDEPENDENT_AMBULATORY_CARE_PROVIDER_SITE_OTHER): Payer: Self-pay | Admitting: Orthopaedic Surgery

## 2016-10-12 ENCOUNTER — Ambulatory Visit (INDEPENDENT_AMBULATORY_CARE_PROVIDER_SITE_OTHER): Payer: Commercial Managed Care - HMO | Admitting: Orthopaedic Surgery

## 2016-10-12 VITALS — Ht 65.0 in | Wt 145.0 lb

## 2016-10-12 DIAGNOSIS — S42201A Unspecified fracture of upper end of right humerus, initial encounter for closed fracture: Secondary | ICD-10-CM | POA: Insufficient documentation

## 2016-10-12 DIAGNOSIS — S42294A Other nondisplaced fracture of upper end of right humerus, initial encounter for closed fracture: Secondary | ICD-10-CM

## 2016-10-12 MED ORDER — HYDROCODONE-ACETAMINOPHEN 5-325 MG PO TABS: 1.0000 | ORAL_TABLET | Freq: Four times a day (QID) | ORAL | 0 refills | Status: DC | PRN

## 2016-10-12 NOTE — Progress Notes (Signed)
Office Visit Note   Patient: Mallory Steele           Date of Birth: September 19, 1932           MRN: [id number] Visit Date: 10/12/2016              Requested by: Worthy Rancher, MD [address] [place], [place] [address] PCP: Fransisca Kaufmann Dettinger, MD   Assessment & Plan: Visit Diagnoses:  1. Other closed nondisplaced fracture of proximal end of right humerus, initial encounter     Plan: Plan is to treat this nonoperatively with sling immobilization for 3 weeks and then begin pendulum exercises. I will see her back in 3 weeks with repeat 2 view x-rays of the right proximal humerus. Norco refill today.  Follow-Up Instructions: Return in about 3 weeks (around 11/02/2016) for recheck right proximal humerus.   Orders:  No orders of the defined types were placed in this encounter.  Meds ordered this encounter  Medications  . HYDROcodone-acetaminophen (NORCO) 5-325 MG tablet    Sig: Take 1-2 tablets by mouth every 6 (six) hours as needed for severe pain.    Dispense:  60 tablet    Refill:  0      Procedures: No procedures performed   Clinical Data: No additional findings.   Subjective: Chief Complaint  Patient presents with  . Right Shoulder - Pain    Patient is a 82 year old female who had a mechanical fall and sustained a nondisplaced proximal humerus fracture on 10/07/2016. She has constant 9 out of 10 pain with bruising and radiation of pain down the arm. She does have moderate dementia. She is wearing the sling and has trouble moving the arm. The pain is worse with movement.    Review of Systems  Constitutional: Negative.   HENT: Negative.   Eyes: Negative.   Respiratory: Negative.   Cardiovascular: Negative.   Endocrine: Negative.   Musculoskeletal: Negative.   Neurological: Negative.   Hematological: Negative.   Psychiatric/Behavioral: Negative.   All other systems reviewed and are negative.    Objective: Vital Signs: Ht 5\' 5"  (1.651 m)   Wt 145 lb (65.8  kg)   BMI 24.13 kg/m   Physical Exam  Constitutional: She is oriented to person, place, and time. She appears well-developed and well-nourished.  HENT:  Head: Atraumatic.  Eyes: EOM are normal.  Neck: Neck supple.  Cardiovascular: Intact distal pulses.   Pulmonary/Chest: Effort normal.  Abdominal: Soft.  Neurological: She is alert and oriented to person, place, and time.  Skin: Skin is warm. Capillary refill takes less than 2 seconds.  Psychiatric: She has a normal mood and affect. Her behavior is normal. Judgment and thought content normal.  Nursing note and vitals reviewed.   Ortho Exam Exam of the right shoulder shows bruising and swelling. Extremities neurovascular intact Specialty Comments:  No specialty comments available.  Imaging: No results found.   PMFS History: Patient Active Problem List   Diagnosis Date Noted  . Closed fracture of right proximal humerus 10/12/2016  . Dementia 10/15/2015  . Insomnia 10/15/2015  . Depression 10/15/2015   Past Medical History:  Diagnosis Date  . Anxiety   . Dementia     Family History  Problem Relation Age of Onset  . Arthritis Mother   . Cancer Mother   . Arthritis Father   . Asthma Father   . COPD Father   . Arthritis Sister   . Birth defects Sister   .  Diabetes Sister   . Hearing loss Sister   . Mental retardation Sister   . Arthritis Brother   . Early death Brother   . Heart disease Brother   . Hypertension Brother     Past Surgical History:  Procedure Laterality Date  . APPENDECTOMY  1947  . COLON SURGERY  [phone]  . JOINT REPLACEMENT     knee replaced   Social History   Occupational History  . Not on file.   Social History Main Topics  . Smoking status: Former Smoker    Packs/day: 0.25    Years: 3.00    Quit date: 09/22/1985  . Smokeless tobacco: Never Used  . Alcohol use 0.6 oz/week    1 Glasses of wine per week  . Drug use: No  . Sexual activity: No

## 2016-10-18 ENCOUNTER — Encounter: Payer: Self-pay | Admitting: Family Medicine

## 2016-10-18 ENCOUNTER — Ambulatory Visit (INDEPENDENT_AMBULATORY_CARE_PROVIDER_SITE_OTHER): Payer: Commercial Managed Care - HMO | Admitting: Family Medicine

## 2016-10-18 VITALS — BP 138/66 | HR 69 | Temp 97.3°F | Ht 65.0 in | Wt 145.5 lb

## 2016-10-18 DIAGNOSIS — H8113 Benign paroxysmal vertigo, bilateral: Secondary | ICD-10-CM | POA: Diagnosis not present

## 2016-10-18 MED ORDER — MECLIZINE HCL 25 MG PO TABS: 25.0000 mg | ORAL_TABLET | Freq: Three times a day (TID) | ORAL | 0 refills | Status: DC | PRN

## 2016-10-18 NOTE — Progress Notes (Signed)
BP 138/66   Pulse 69   Temp 97.3 F (36.3 C) (Oral)   Ht 5\' 5"  (1.651 m)   Wt 145 lb 8 oz (66 kg)   BMI 24.21 kg/m    Subjective:    Patient ID: Mallory Steele, female    DOB: [birth year], 82 y.o.   MRN: [id number]  HPI: Mallory Steele is a 82 y.o. female presenting on 10/18/2016 for Dizziness (x 3 days )   HPI Dizziness and spinning sensation Patient is coming in today for dizziness and spinning sensation is been going on for 3 days. She denies lightheadedness but more of a spinning sensation like the room is going around that is been going on intermittently over the past few days. She did have a fall about 3 days ago and after that was when this has been occurring. She denies any nausea or vomiting. She says she has a little bit of a left right now. She does not have as much as she had before.  Relevant past medical, surgical, family and social history reviewed and updated as indicated. Interim medical history since our last visit reviewed. Allergies and medications reviewed and updated.  Review of Systems  Constitutional: Negative for chills and fever.  HENT: Negative for congestion, ear discharge and ear pain.   Eyes: Negative for redness and visual disturbance.  Respiratory: Negative for chest tightness and shortness of breath.   Cardiovascular: Negative for chest pain and leg swelling.  Genitourinary: Negative for difficulty urinating and dysuria.  Musculoskeletal: Negative for back pain and gait problem.  Skin: Negative for rash.  Neurological: Positive for dizziness. Negative for weakness, light-headedness, numbness and headaches.  Psychiatric/Behavioral: Negative for agitation and behavioral problems.  All other systems reviewed and are negative.   Per HPI unless specifically indicated above      Objective:    BP 138/66   Pulse 69   Temp 97.3 F (36.3 C) (Oral)   Ht 5\' 5"  (1.651 m)   Wt 145 lb 8 oz (66 kg)   BMI 24.21 kg/m   Wt Readings from Last 3  Encounters:  10/18/16 145 lb 8 oz (66 kg)  10/12/16 145 lb (65.8 kg)  10/07/16 145 lb (65.8 kg)    Physical Exam  Constitutional: She is oriented to person, place, and time. She appears well-developed and well-nourished. No distress.  Eyes: Conjunctivae are normal.  Cardiovascular: Normal rate, regular rhythm, normal heart sounds and intact distal pulses.   No murmur heard. Pulmonary/Chest: Effort normal and breath sounds normal. No respiratory distress. She has no wheezes. She has no rales.  Musculoskeletal: Normal range of motion. She exhibits no edema or tenderness.  Neurological: She is alert and oriented to person, place, and time. She has normal strength and normal reflexes. No cranial nerve deficit or sensory deficit. Coordination normal.  Skin: Skin is warm and dry. No rash noted. She is not diaphoretic.  Psychiatric: She has a normal mood and affect. Her behavior is normal.  Nursing note and vitals reviewed.     Assessment & Plan:   Problem List Items Addressed This Visit    None    Visit Diagnoses    Benign paroxysmal positional vertigo due to bilateral vestibular disorder    -  Primary   Gave exercise sheet and meclizine, be cautious about getting up so we'll have another fall.   Relevant Medications   meclizine (ANTIVERT) 25 MG tablet       Follow up plan: Return  if symptoms worsen or fail to improve.  Counseling provided for all of the vaccine components No orders of the defined types were placed in this encounter.   Caryl Pina, MD [facility] 10/18/2016, 12:21 PM

## 2016-11-01 ENCOUNTER — Other Ambulatory Visit: Payer: Self-pay | Admitting: Family Medicine

## 2016-11-01 DIAGNOSIS — F03918 Unspecified dementia, unspecified severity, with other behavioral disturbance: Secondary | ICD-10-CM

## 2016-11-01 DIAGNOSIS — F0391 Unspecified dementia with behavioral disturbance: Secondary | ICD-10-CM

## 2016-11-02 ENCOUNTER — Ambulatory Visit (INDEPENDENT_AMBULATORY_CARE_PROVIDER_SITE_OTHER): Payer: Commercial Managed Care - HMO

## 2016-11-02 ENCOUNTER — Telehealth (INDEPENDENT_AMBULATORY_CARE_PROVIDER_SITE_OTHER): Payer: Self-pay

## 2016-11-02 ENCOUNTER — Ambulatory Visit (INDEPENDENT_AMBULATORY_CARE_PROVIDER_SITE_OTHER): Payer: Commercial Managed Care - HMO | Admitting: Orthopaedic Surgery

## 2016-11-02 ENCOUNTER — Encounter (INDEPENDENT_AMBULATORY_CARE_PROVIDER_SITE_OTHER): Payer: Self-pay | Admitting: Orthopaedic Surgery

## 2016-11-02 ENCOUNTER — Other Ambulatory Visit: Payer: Self-pay | Admitting: Family Medicine

## 2016-11-02 DIAGNOSIS — S52125A Nondisplaced fracture of head of left radius, initial encounter for closed fracture: Secondary | ICD-10-CM

## 2016-11-02 DIAGNOSIS — S42294D Other nondisplaced fracture of upper end of right humerus, subsequent encounter for fracture with routine healing: Secondary | ICD-10-CM

## 2016-11-02 DIAGNOSIS — H8113 Benign paroxysmal vertigo, bilateral: Secondary | ICD-10-CM

## 2016-11-02 MED ORDER — HYDROCODONE-ACETAMINOPHEN 5-325 MG PO TABS: 1.0000 | ORAL_TABLET | Freq: Four times a day (QID) | ORAL | 0 refills | Status: DC | PRN

## 2016-11-02 NOTE — Telephone Encounter (Signed)
Faxed home health order to [facility] per Dr Erlinda Hong.

## 2016-11-02 NOTE — Progress Notes (Signed)
Office Visit Note   Patient: Mallory Steele           Date of Birth: 1932-04-02           MRN: [id number] Visit Date: 11/02/2016              Requested by: Worthy Rancher, MD [address] [place],  [address] PCP: Fransisca Kaufmann Dettinger, MD   Assessment & Plan: Visit Diagnoses:  1. Other closed nondisplaced fracture of proximal end of right humerus with routine healing, subsequent encounter   2. Closed nondisplaced fracture of head of left radius, initial encounter     Plan: Right proximal humerus fracture stable. Begin pendulum exercises with home health physical therapy. Also begin to do range of motion with the left elbow. X-rays are stable. We will see her back in 3 weeks with 2 view x-rays of the right shoulder only.  Follow-Up Instructions: Return in about 3 weeks (around 11/23/2016) for recheck right proximal humerus fx.   Orders:  Orders Placed This Encounter  Procedures  . XR Humerus Right  . XR Elbow 2 Views Left   Meds ordered this encounter  Medications  . HYDROcodone-acetaminophen (NORCO) 5-325 MG tablet    Sig: Take 1 tablet by mouth every 6 (six) hours as needed.    Dispense:  40 tablet    Refill:  0      Procedures: No procedures performed   Clinical Data: No additional findings.   Subjective: Chief Complaint  Patient presents with  . Left Elbow - Pain  . Right Shoulder - Pain, Follow-up    Patient follows up today for her 3 week visit for right proximal humerus fracture and new problem of left elbow pain. She has 4-5 out of 10 pain pain with certain movements. The pain is worse at night and in the morning. In with elbow supination.    Review of Systems  Constitutional: Negative.   HENT: Negative.   Eyes: Negative.   Respiratory: Negative.   Cardiovascular: Negative.   Endocrine: Negative.   Musculoskeletal: Negative.   Neurological: Negative.   Hematological: Negative.   Psychiatric/Behavioral: Negative.   All other systems reviewed  and are negative.    Objective: Vital Signs: There were no vitals taken for this visit.  Physical Exam  Constitutional: She is oriented to person, place, and time. She appears well-developed and well-nourished.  HENT:  Head: Atraumatic.  Eyes: EOM are normal.  Neck: Neck supple.  Cardiovascular: Intact distal pulses.   Pulmonary/Chest: Effort normal.  Abdominal: Soft.  Neurological: She is alert and oriented to person, place, and time.  Skin: Skin is warm. Capillary refill takes less than 2 seconds.  Psychiatric: She has a normal mood and affect. Her behavior is normal. Judgment and thought content normal.  Nursing note and vitals reviewed.   Ortho Exam Exam of the right shoulder is stable. Less pain with range of motion. Exam of the left elbow shows good flexion and extension but pain with supination. Radial head is tender. Specialty Comments:  No specialty comments available.  Imaging: Xr Elbow 2 Views Left  Result Date: 11/02/2016 Nondisplaced radial head fracture  Xr Humerus Right  Result Date: 11/02/2016 Stable right proximal humerus fracture    PMFS History: Patient Active Problem List   Diagnosis Date Noted  . Closed nondisplaced fracture of head of left radius 11/02/2016  . Closed fracture of right proximal humerus 10/12/2016  . Dementia 10/15/2015  . Insomnia 10/15/2015  .  Depression 10/15/2015   Past Medical History:  Diagnosis Date  . Anxiety   . Dementia     Family History  Problem Relation Age of Onset  . Arthritis Mother   . Cancer Mother   . Arthritis Father   . Asthma Father   . COPD Father   . Arthritis Sister   . Birth defects Sister   . Diabetes Sister   . Hearing loss Sister   . Mental retardation Sister   . Arthritis Brother   . Early death Brother   . Heart disease Brother   . Hypertension Brother     Past Surgical History:  Procedure Laterality Date  . APPENDECTOMY  1947  . COLON SURGERY  [phone]  . JOINT  REPLACEMENT     knee replaced   Social History   Occupational History  . Not on file.   Social History Main Topics  . Smoking status: Former Smoker    Packs/day: 0.25    Years: 3.00    Quit date: 09/22/1985  . Smokeless tobacco: Never Used  . Alcohol use 0.6 oz/week    1 Glasses of wine per week  . Drug use: No  . Sexual activity: No

## 2016-11-04 DIAGNOSIS — F0390 Unspecified dementia without behavioral disturbance: Secondary | ICD-10-CM | POA: Diagnosis not present

## 2016-11-04 DIAGNOSIS — S52122D Displaced fracture of head of left radius, subsequent encounter for closed fracture with routine healing: Secondary | ICD-10-CM | POA: Diagnosis not present

## 2016-11-04 DIAGNOSIS — F419 Anxiety disorder, unspecified: Secondary | ICD-10-CM | POA: Diagnosis not present

## 2016-11-04 DIAGNOSIS — F329 Major depressive disorder, single episode, unspecified: Secondary | ICD-10-CM | POA: Diagnosis not present

## 2016-11-04 DIAGNOSIS — S42201D Unspecified fracture of upper end of right humerus, subsequent encounter for fracture with routine healing: Secondary | ICD-10-CM | POA: Diagnosis not present

## 2016-11-04 DIAGNOSIS — W0110XD Fall on same level from slipping, tripping and stumbling with subsequent striking against unspecified object, subsequent encounter: Secondary | ICD-10-CM | POA: Diagnosis not present

## 2016-11-09 DIAGNOSIS — S52122D Displaced fracture of head of left radius, subsequent encounter for closed fracture with routine healing: Secondary | ICD-10-CM | POA: Diagnosis not present

## 2016-11-09 DIAGNOSIS — F329 Major depressive disorder, single episode, unspecified: Secondary | ICD-10-CM | POA: Diagnosis not present

## 2016-11-09 DIAGNOSIS — S42201D Unspecified fracture of upper end of right humerus, subsequent encounter for fracture with routine healing: Secondary | ICD-10-CM | POA: Diagnosis not present

## 2016-11-09 DIAGNOSIS — W0110XD Fall on same level from slipping, tripping and stumbling with subsequent striking against unspecified object, subsequent encounter: Secondary | ICD-10-CM | POA: Diagnosis not present

## 2016-11-09 DIAGNOSIS — F0390 Unspecified dementia without behavioral disturbance: Secondary | ICD-10-CM | POA: Diagnosis not present

## 2016-11-09 DIAGNOSIS — F419 Anxiety disorder, unspecified: Secondary | ICD-10-CM | POA: Diagnosis not present

## 2016-11-12 DIAGNOSIS — S52122D Displaced fracture of head of left radius, subsequent encounter for closed fracture with routine healing: Secondary | ICD-10-CM | POA: Diagnosis not present

## 2016-11-12 DIAGNOSIS — S42201D Unspecified fracture of upper end of right humerus, subsequent encounter for fracture with routine healing: Secondary | ICD-10-CM | POA: Diagnosis not present

## 2016-11-12 DIAGNOSIS — W0110XD Fall on same level from slipping, tripping and stumbling with subsequent striking against unspecified object, subsequent encounter: Secondary | ICD-10-CM | POA: Diagnosis not present

## 2016-11-12 DIAGNOSIS — F419 Anxiety disorder, unspecified: Secondary | ICD-10-CM | POA: Diagnosis not present

## 2016-11-12 DIAGNOSIS — F0390 Unspecified dementia without behavioral disturbance: Secondary | ICD-10-CM | POA: Diagnosis not present

## 2016-11-12 DIAGNOSIS — F329 Major depressive disorder, single episode, unspecified: Secondary | ICD-10-CM | POA: Diagnosis not present

## 2016-11-16 DIAGNOSIS — F0390 Unspecified dementia without behavioral disturbance: Secondary | ICD-10-CM | POA: Diagnosis not present

## 2016-11-16 DIAGNOSIS — S52122D Displaced fracture of head of left radius, subsequent encounter for closed fracture with routine healing: Secondary | ICD-10-CM | POA: Diagnosis not present

## 2016-11-16 DIAGNOSIS — W0110XD Fall on same level from slipping, tripping and stumbling with subsequent striking against unspecified object, subsequent encounter: Secondary | ICD-10-CM | POA: Diagnosis not present

## 2016-11-16 DIAGNOSIS — F419 Anxiety disorder, unspecified: Secondary | ICD-10-CM | POA: Diagnosis not present

## 2016-11-16 DIAGNOSIS — S42201D Unspecified fracture of upper end of right humerus, subsequent encounter for fracture with routine healing: Secondary | ICD-10-CM | POA: Diagnosis not present

## 2016-11-16 DIAGNOSIS — F329 Major depressive disorder, single episode, unspecified: Secondary | ICD-10-CM | POA: Diagnosis not present

## 2016-11-17 ENCOUNTER — Other Ambulatory Visit: Payer: Self-pay | Admitting: Family Medicine

## 2016-11-17 DIAGNOSIS — F0391 Unspecified dementia with behavioral disturbance: Secondary | ICD-10-CM

## 2016-11-17 DIAGNOSIS — F03918 Unspecified dementia, unspecified severity, with other behavioral disturbance: Secondary | ICD-10-CM

## 2016-11-18 DIAGNOSIS — S42201D Unspecified fracture of upper end of right humerus, subsequent encounter for fracture with routine healing: Secondary | ICD-10-CM | POA: Diagnosis not present

## 2016-11-18 DIAGNOSIS — F0390 Unspecified dementia without behavioral disturbance: Secondary | ICD-10-CM | POA: Diagnosis not present

## 2016-11-18 DIAGNOSIS — F419 Anxiety disorder, unspecified: Secondary | ICD-10-CM | POA: Diagnosis not present

## 2016-11-18 DIAGNOSIS — F329 Major depressive disorder, single episode, unspecified: Secondary | ICD-10-CM | POA: Diagnosis not present

## 2016-11-18 DIAGNOSIS — S52122D Displaced fracture of head of left radius, subsequent encounter for closed fracture with routine healing: Secondary | ICD-10-CM | POA: Diagnosis not present

## 2016-11-18 DIAGNOSIS — W0110XD Fall on same level from slipping, tripping and stumbling with subsequent striking against unspecified object, subsequent encounter: Secondary | ICD-10-CM | POA: Diagnosis not present

## 2016-11-24 DIAGNOSIS — F329 Major depressive disorder, single episode, unspecified: Secondary | ICD-10-CM | POA: Diagnosis not present

## 2016-11-24 DIAGNOSIS — F0390 Unspecified dementia without behavioral disturbance: Secondary | ICD-10-CM | POA: Diagnosis not present

## 2016-11-24 DIAGNOSIS — W0110XD Fall on same level from slipping, tripping and stumbling with subsequent striking against unspecified object, subsequent encounter: Secondary | ICD-10-CM | POA: Diagnosis not present

## 2016-11-24 DIAGNOSIS — S52122D Displaced fracture of head of left radius, subsequent encounter for closed fracture with routine healing: Secondary | ICD-10-CM | POA: Diagnosis not present

## 2016-11-24 DIAGNOSIS — F419 Anxiety disorder, unspecified: Secondary | ICD-10-CM | POA: Diagnosis not present

## 2016-11-24 DIAGNOSIS — S42201D Unspecified fracture of upper end of right humerus, subsequent encounter for fracture with routine healing: Secondary | ICD-10-CM | POA: Diagnosis not present

## 2016-11-25 ENCOUNTER — Ambulatory Visit (INDEPENDENT_AMBULATORY_CARE_PROVIDER_SITE_OTHER): Payer: Commercial Managed Care - HMO | Admitting: Orthopaedic Surgery

## 2016-11-26 ENCOUNTER — Ambulatory Visit (INDEPENDENT_AMBULATORY_CARE_PROVIDER_SITE_OTHER): Payer: Commercial Managed Care - HMO

## 2016-11-26 ENCOUNTER — Encounter (INDEPENDENT_AMBULATORY_CARE_PROVIDER_SITE_OTHER): Payer: Self-pay

## 2016-11-26 ENCOUNTER — Ambulatory Visit (INDEPENDENT_AMBULATORY_CARE_PROVIDER_SITE_OTHER): Payer: Commercial Managed Care - HMO | Admitting: Orthopaedic Surgery

## 2016-11-26 ENCOUNTER — Encounter (INDEPENDENT_AMBULATORY_CARE_PROVIDER_SITE_OTHER): Payer: Self-pay | Admitting: Orthopaedic Surgery

## 2016-11-26 DIAGNOSIS — S42294D Other nondisplaced fracture of upper end of right humerus, subsequent encounter for fracture with routine healing: Secondary | ICD-10-CM

## 2016-11-26 DIAGNOSIS — S52125D Nondisplaced fracture of head of left radius, subsequent encounter for closed fracture with routine healing: Secondary | ICD-10-CM

## 2016-11-26 NOTE — Progress Notes (Signed)
Mallory Steele follows up today for her right proximal humerus fracture and left radial head fracture. She is doing well. She is able to raise her arm above her shoulder. She is doing physical therapy. She has minimal pain. X-rays of the right shoulder shows healed proximal humerus fracture. At this point continue to advance her with physical therapy and home exercises as tolerated. I'll see her back as needed.

## 2016-11-30 DIAGNOSIS — F329 Major depressive disorder, single episode, unspecified: Secondary | ICD-10-CM | POA: Diagnosis not present

## 2016-11-30 DIAGNOSIS — F419 Anxiety disorder, unspecified: Secondary | ICD-10-CM | POA: Diagnosis not present

## 2016-11-30 DIAGNOSIS — W0110XD Fall on same level from slipping, tripping and stumbling with subsequent striking against unspecified object, subsequent encounter: Secondary | ICD-10-CM | POA: Diagnosis not present

## 2016-11-30 DIAGNOSIS — F0390 Unspecified dementia without behavioral disturbance: Secondary | ICD-10-CM | POA: Diagnosis not present

## 2016-11-30 DIAGNOSIS — S42201D Unspecified fracture of upper end of right humerus, subsequent encounter for fracture with routine healing: Secondary | ICD-10-CM | POA: Diagnosis not present

## 2016-11-30 DIAGNOSIS — S52122D Displaced fracture of head of left radius, subsequent encounter for closed fracture with routine healing: Secondary | ICD-10-CM | POA: Diagnosis not present

## 2016-12-01 DIAGNOSIS — W0110XD Fall on same level from slipping, tripping and stumbling with subsequent striking against unspecified object, subsequent encounter: Secondary | ICD-10-CM | POA: Diagnosis not present

## 2016-12-01 DIAGNOSIS — S52122D Displaced fracture of head of left radius, subsequent encounter for closed fracture with routine healing: Secondary | ICD-10-CM | POA: Diagnosis not present

## 2016-12-01 DIAGNOSIS — F329 Major depressive disorder, single episode, unspecified: Secondary | ICD-10-CM | POA: Diagnosis not present

## 2016-12-01 DIAGNOSIS — F419 Anxiety disorder, unspecified: Secondary | ICD-10-CM | POA: Diagnosis not present

## 2016-12-01 DIAGNOSIS — F0390 Unspecified dementia without behavioral disturbance: Secondary | ICD-10-CM | POA: Diagnosis not present

## 2016-12-01 DIAGNOSIS — S42201D Unspecified fracture of upper end of right humerus, subsequent encounter for fracture with routine healing: Secondary | ICD-10-CM | POA: Diagnosis not present

## 2016-12-06 ENCOUNTER — Ambulatory Visit (INDEPENDENT_AMBULATORY_CARE_PROVIDER_SITE_OTHER): Payer: Medicare HMO | Admitting: Family Medicine

## 2016-12-06 ENCOUNTER — Encounter: Payer: Self-pay | Admitting: Family Medicine

## 2016-12-06 VITALS — BP 134/84 | HR 76 | Temp 97.5°F | Ht 65.0 in | Wt 146.0 lb

## 2016-12-06 DIAGNOSIS — L209 Atopic dermatitis, unspecified: Secondary | ICD-10-CM | POA: Diagnosis not present

## 2016-12-06 MED ORDER — PREDNISONE 20 MG PO TABS: ORAL_TABLET | ORAL | 0 refills | Status: DC

## 2016-12-06 NOTE — Progress Notes (Signed)
BP (!) 160/99 (BP Location: Left Wrist)   Pulse 76   Temp 97.5 F (36.4 C) (Oral)   Ht 5\' 5"  (1.651 m)   Wt 146 lb (66.2 kg)   BMI 24.30 kg/m    Subjective:    Patient ID: Mallory Steele, female    DOB: November 24, 1931, 83 y.o.   MRN: [id number]  HPI: Mallory Steele is a 83 y.o. female presenting on 12/06/2016 for face red (burns)   HPI Facial rash Patient has a facial rash that started over the past couple days. She says the rash is spreading and started on her forehead and then spread to her cheeks and her face. It is very irritated and she says that it did initially have a scale that she scrubbed it off of her face. She says she does have a patch of psoriasis on her right lateral hip but does not want to show me because of modestly purposes. She denies any fevers or chills or drainage from the rash. She just said is very irritating  Relevant past medical, surgical, family and social history reviewed and updated as indicated. Interim medical history since our last visit reviewed. Allergies and medications reviewed and updated.  Review of Systems  Constitutional: Negative for chills and fever.  Eyes: Negative for pain and visual disturbance.  Respiratory: Negative for chest tightness and shortness of breath.   Cardiovascular: Negative for chest pain and leg swelling.  Genitourinary: Negative for difficulty urinating and dysuria.  Musculoskeletal: Negative for back pain and gait problem.  Skin: Positive for rash.  Neurological: Negative for light-headedness and headaches.  Psychiatric/Behavioral: Negative for agitation and behavioral problems.  All other systems reviewed and are negative.   Per HPI unless specifically indicated above     Objective:    BP (!) 160/99 (BP Location: Left Wrist)   Pulse 76   Temp 97.5 F (36.4 C) (Oral)   Ht 5\' 5"  (1.651 m)   Wt 146 lb (66.2 kg)   BMI 24.30 kg/m   Wt Readings from Last 3 Encounters:  12/06/16 146 lb (66.2 kg)  10/18/16 145  lb 8 oz (66 kg)  10/12/16 145 lb (65.8 kg)    Physical Exam  Constitutional: She is oriented to person, place, and time. She appears well-developed and well-nourished. No distress.  Eyes: Conjunctivae are normal.  Cardiovascular: Normal rate, regular rhythm, normal heart sounds and intact distal pulses.   No murmur heard. Pulmonary/Chest: Effort normal and breath sounds normal. No respiratory distress. She has no wheezes. She has no rales.  Musculoskeletal: Normal range of motion. She exhibits no edema or tenderness.  Neurological: She is alert and oriented to person, place, and time. Coordination normal.  Skin: Skin is warm and dry. Rash noted. Rash is maculopapular (Maculopapular rash that is pink and inflamed but not erythematous or warm, no drainage or fluctuance or purulence. Rashes on forehead and bilateral cheeks and on the nose and a little bit on the chin.). She is not diaphoretic.  Psychiatric: She has a normal mood and affect. Her behavior is normal.  Nursing note and vitals reviewed.      Assessment & Plan:   Problem List Items Addressed This Visit    None    Visit Diagnoses    Atopic dermatitis, unspecified type    -  Primary   Relevant Medications   predniSONE (DELTASONE) 20 MG tablet       Follow up plan: Return if symptoms worsen or fail to  improve.  Counseling provided for all of the vaccine components No orders of the defined types were placed in this encounter.   Caryl Pina, MD [facility] 12/06/2016, 6:21 PM

## 2016-12-07 DIAGNOSIS — S42201D Unspecified fracture of upper end of right humerus, subsequent encounter for fracture with routine healing: Secondary | ICD-10-CM | POA: Diagnosis not present

## 2016-12-07 DIAGNOSIS — F329 Major depressive disorder, single episode, unspecified: Secondary | ICD-10-CM | POA: Diagnosis not present

## 2016-12-07 DIAGNOSIS — F419 Anxiety disorder, unspecified: Secondary | ICD-10-CM | POA: Diagnosis not present

## 2016-12-07 DIAGNOSIS — W0110XD Fall on same level from slipping, tripping and stumbling with subsequent striking against unspecified object, subsequent encounter: Secondary | ICD-10-CM | POA: Diagnosis not present

## 2016-12-07 DIAGNOSIS — S52122D Displaced fracture of head of left radius, subsequent encounter for closed fracture with routine healing: Secondary | ICD-10-CM | POA: Diagnosis not present

## 2016-12-07 DIAGNOSIS — F0390 Unspecified dementia without behavioral disturbance: Secondary | ICD-10-CM | POA: Diagnosis not present

## 2016-12-08 ENCOUNTER — Other Ambulatory Visit: Payer: Self-pay | Admitting: Family Medicine

## 2016-12-10 DIAGNOSIS — S42201D Unspecified fracture of upper end of right humerus, subsequent encounter for fracture with routine healing: Secondary | ICD-10-CM | POA: Diagnosis not present

## 2016-12-10 DIAGNOSIS — F419 Anxiety disorder, unspecified: Secondary | ICD-10-CM | POA: Diagnosis not present

## 2016-12-10 DIAGNOSIS — W0110XD Fall on same level from slipping, tripping and stumbling with subsequent striking against unspecified object, subsequent encounter: Secondary | ICD-10-CM | POA: Diagnosis not present

## 2016-12-10 DIAGNOSIS — S52122D Displaced fracture of head of left radius, subsequent encounter for closed fracture with routine healing: Secondary | ICD-10-CM | POA: Diagnosis not present

## 2016-12-10 DIAGNOSIS — F0390 Unspecified dementia without behavioral disturbance: Secondary | ICD-10-CM | POA: Diagnosis not present

## 2016-12-10 DIAGNOSIS — F329 Major depressive disorder, single episode, unspecified: Secondary | ICD-10-CM | POA: Diagnosis not present

## 2016-12-14 ENCOUNTER — Telehealth: Payer: Self-pay | Admitting: Family Medicine

## 2016-12-14 DIAGNOSIS — F329 Major depressive disorder, single episode, unspecified: Secondary | ICD-10-CM | POA: Diagnosis not present

## 2016-12-14 DIAGNOSIS — F0390 Unspecified dementia without behavioral disturbance: Secondary | ICD-10-CM | POA: Diagnosis not present

## 2016-12-14 DIAGNOSIS — F419 Anxiety disorder, unspecified: Secondary | ICD-10-CM | POA: Diagnosis not present

## 2016-12-14 DIAGNOSIS — Z780 Asymptomatic menopausal state: Secondary | ICD-10-CM

## 2016-12-14 DIAGNOSIS — Z13820 Encounter for screening for osteoporosis: Secondary | ICD-10-CM

## 2016-12-14 DIAGNOSIS — S42201D Unspecified fracture of upper end of right humerus, subsequent encounter for fracture with routine healing: Secondary | ICD-10-CM | POA: Diagnosis not present

## 2016-12-14 DIAGNOSIS — W0110XD Fall on same level from slipping, tripping and stumbling with subsequent striking against unspecified object, subsequent encounter: Secondary | ICD-10-CM | POA: Diagnosis not present

## 2016-12-14 DIAGNOSIS — S52122D Displaced fracture of head of left radius, subsequent encounter for closed fracture with routine healing: Secondary | ICD-10-CM | POA: Diagnosis not present

## 2016-12-14 NOTE — Telephone Encounter (Signed)
[facility] requested that we do a DEXA - after speaking with pt's daughter - this was ordered

## 2016-12-16 DIAGNOSIS — F329 Major depressive disorder, single episode, unspecified: Secondary | ICD-10-CM | POA: Diagnosis not present

## 2016-12-16 DIAGNOSIS — F0390 Unspecified dementia without behavioral disturbance: Secondary | ICD-10-CM | POA: Diagnosis not present

## 2016-12-16 DIAGNOSIS — F419 Anxiety disorder, unspecified: Secondary | ICD-10-CM | POA: Diagnosis not present

## 2016-12-16 DIAGNOSIS — S42201D Unspecified fracture of upper end of right humerus, subsequent encounter for fracture with routine healing: Secondary | ICD-10-CM | POA: Diagnosis not present

## 2016-12-16 DIAGNOSIS — W0110XD Fall on same level from slipping, tripping and stumbling with subsequent striking against unspecified object, subsequent encounter: Secondary | ICD-10-CM | POA: Diagnosis not present

## 2016-12-16 DIAGNOSIS — S52122D Displaced fracture of head of left radius, subsequent encounter for closed fracture with routine healing: Secondary | ICD-10-CM | POA: Diagnosis not present

## 2016-12-19 ENCOUNTER — Other Ambulatory Visit: Payer: Self-pay | Admitting: Family Medicine

## 2016-12-20 ENCOUNTER — Other Ambulatory Visit: Payer: Self-pay | Admitting: Family Medicine

## 2016-12-20 DIAGNOSIS — F0390 Unspecified dementia without behavioral disturbance: Secondary | ICD-10-CM | POA: Diagnosis not present

## 2016-12-20 DIAGNOSIS — F329 Major depressive disorder, single episode, unspecified: Secondary | ICD-10-CM | POA: Diagnosis not present

## 2016-12-20 DIAGNOSIS — F0391 Unspecified dementia with behavioral disturbance: Secondary | ICD-10-CM

## 2016-12-20 DIAGNOSIS — W0110XD Fall on same level from slipping, tripping and stumbling with subsequent striking against unspecified object, subsequent encounter: Secondary | ICD-10-CM | POA: Diagnosis not present

## 2016-12-20 DIAGNOSIS — S42201D Unspecified fracture of upper end of right humerus, subsequent encounter for fracture with routine healing: Secondary | ICD-10-CM | POA: Diagnosis not present

## 2016-12-20 DIAGNOSIS — S52122D Displaced fracture of head of left radius, subsequent encounter for closed fracture with routine healing: Secondary | ICD-10-CM | POA: Diagnosis not present

## 2016-12-20 DIAGNOSIS — F419 Anxiety disorder, unspecified: Secondary | ICD-10-CM | POA: Diagnosis not present

## 2016-12-20 DIAGNOSIS — F03918 Unspecified dementia, unspecified severity, with other behavioral disturbance: Secondary | ICD-10-CM

## 2016-12-22 ENCOUNTER — Other Ambulatory Visit: Payer: Medicare HMO

## 2016-12-22 DIAGNOSIS — W0110XD Fall on same level from slipping, tripping and stumbling with subsequent striking against unspecified object, subsequent encounter: Secondary | ICD-10-CM | POA: Diagnosis not present

## 2016-12-22 DIAGNOSIS — F329 Major depressive disorder, single episode, unspecified: Secondary | ICD-10-CM | POA: Diagnosis not present

## 2016-12-22 DIAGNOSIS — S52122D Displaced fracture of head of left radius, subsequent encounter for closed fracture with routine healing: Secondary | ICD-10-CM | POA: Diagnosis not present

## 2016-12-22 DIAGNOSIS — F0390 Unspecified dementia without behavioral disturbance: Secondary | ICD-10-CM | POA: Diagnosis not present

## 2016-12-22 DIAGNOSIS — S42201D Unspecified fracture of upper end of right humerus, subsequent encounter for fracture with routine healing: Secondary | ICD-10-CM | POA: Diagnosis not present

## 2016-12-22 DIAGNOSIS — F419 Anxiety disorder, unspecified: Secondary | ICD-10-CM | POA: Diagnosis not present

## 2016-12-27 DIAGNOSIS — S42201D Unspecified fracture of upper end of right humerus, subsequent encounter for fracture with routine healing: Secondary | ICD-10-CM | POA: Diagnosis not present

## 2016-12-27 DIAGNOSIS — W0110XD Fall on same level from slipping, tripping and stumbling with subsequent striking against unspecified object, subsequent encounter: Secondary | ICD-10-CM | POA: Diagnosis not present

## 2016-12-27 DIAGNOSIS — S52122D Displaced fracture of head of left radius, subsequent encounter for closed fracture with routine healing: Secondary | ICD-10-CM | POA: Diagnosis not present

## 2016-12-27 DIAGNOSIS — F0390 Unspecified dementia without behavioral disturbance: Secondary | ICD-10-CM | POA: Diagnosis not present

## 2016-12-27 DIAGNOSIS — F419 Anxiety disorder, unspecified: Secondary | ICD-10-CM | POA: Diagnosis not present

## 2016-12-27 DIAGNOSIS — F329 Major depressive disorder, single episode, unspecified: Secondary | ICD-10-CM | POA: Diagnosis not present

## 2016-12-30 DIAGNOSIS — S52122D Displaced fracture of head of left radius, subsequent encounter for closed fracture with routine healing: Secondary | ICD-10-CM | POA: Diagnosis not present

## 2016-12-30 DIAGNOSIS — F329 Major depressive disorder, single episode, unspecified: Secondary | ICD-10-CM | POA: Diagnosis not present

## 2016-12-30 DIAGNOSIS — W0110XD Fall on same level from slipping, tripping and stumbling with subsequent striking against unspecified object, subsequent encounter: Secondary | ICD-10-CM | POA: Diagnosis not present

## 2016-12-30 DIAGNOSIS — F0390 Unspecified dementia without behavioral disturbance: Secondary | ICD-10-CM | POA: Diagnosis not present

## 2016-12-30 DIAGNOSIS — F419 Anxiety disorder, unspecified: Secondary | ICD-10-CM | POA: Diagnosis not present

## 2016-12-30 DIAGNOSIS — S42201D Unspecified fracture of upper end of right humerus, subsequent encounter for fracture with routine healing: Secondary | ICD-10-CM | POA: Diagnosis not present

## 2017-02-13 ENCOUNTER — Other Ambulatory Visit: Payer: Self-pay | Admitting: Family Medicine

## 2017-02-13 DIAGNOSIS — F03918 Unspecified dementia, unspecified severity, with other behavioral disturbance: Secondary | ICD-10-CM

## 2017-02-13 DIAGNOSIS — F0391 Unspecified dementia with behavioral disturbance: Secondary | ICD-10-CM

## 2017-02-25 ENCOUNTER — Other Ambulatory Visit: Payer: Self-pay | Admitting: Family Medicine

## 2017-02-25 DIAGNOSIS — F0391 Unspecified dementia with behavioral disturbance: Secondary | ICD-10-CM

## 2017-02-25 DIAGNOSIS — F03918 Unspecified dementia, unspecified severity, with other behavioral disturbance: Secondary | ICD-10-CM

## 2017-03-08 ENCOUNTER — Other Ambulatory Visit: Payer: Self-pay | Admitting: Family Medicine

## 2017-03-21 ENCOUNTER — Other Ambulatory Visit: Payer: Self-pay | Admitting: Family Medicine

## 2017-03-21 DIAGNOSIS — F0391 Unspecified dementia with behavioral disturbance: Secondary | ICD-10-CM

## 2017-03-21 DIAGNOSIS — F03918 Unspecified dementia, unspecified severity, with other behavioral disturbance: Secondary | ICD-10-CM

## 2017-03-24 ENCOUNTER — Ambulatory Visit (INDEPENDENT_AMBULATORY_CARE_PROVIDER_SITE_OTHER): Payer: Medicare HMO | Admitting: Family Medicine

## 2017-03-24 ENCOUNTER — Ambulatory Visit (INDEPENDENT_AMBULATORY_CARE_PROVIDER_SITE_OTHER): Payer: Medicare HMO

## 2017-03-24 ENCOUNTER — Telehealth: Payer: Self-pay | Admitting: Family Medicine

## 2017-03-24 ENCOUNTER — Encounter: Payer: Self-pay | Admitting: Family Medicine

## 2017-03-24 VITALS — BP 142/78 | HR 71 | Temp 97.8°F | Ht 65.0 in | Wt 148.0 lb

## 2017-03-24 DIAGNOSIS — K591 Functional diarrhea: Secondary | ICD-10-CM

## 2017-03-24 DIAGNOSIS — R252 Cramp and spasm: Secondary | ICD-10-CM | POA: Diagnosis not present

## 2017-03-24 DIAGNOSIS — R1084 Generalized abdominal pain: Secondary | ICD-10-CM

## 2017-03-24 LAB — URINALYSIS, COMPLETE
BILIRUBIN UA: NEGATIVE
Glucose, UA: NEGATIVE
Ketones, UA: NEGATIVE
LEUKOCYTES UA: NEGATIVE
Nitrite, UA: NEGATIVE
PH UA: 6.5 (ref 5.0–7.5)
RBC UA: NEGATIVE
Specific Gravity, UA: 1.015 (ref 1.005–1.030)
Urobilinogen, Ur: 1 mg/dL (ref 0.2–1.0)

## 2017-03-24 LAB — MICROSCOPIC EXAMINATION
RBC MICROSCOPIC, UA: NONE SEEN /HPF (ref 0–?)
RENAL EPITHEL UA: NONE SEEN /HPF

## 2017-03-24 MED ORDER — ROPINIROLE HCL 0.25 MG PO TABS: 0.2500 mg | ORAL_TABLET | Freq: Three times a day (TID) | ORAL | 2 refills | Status: DC

## 2017-03-24 NOTE — Progress Notes (Signed)
Subjective:  Patient ID: Mallory Steele, female    DOB: 1932/10/22  Age: 83 y.o. MRN: [id number]  CC: Spasms (pt here today c/o what she describes as a "muscle spasms" all over her body. She states " I feel like I'm dehydrated")   HPI Jonee K Aylesworth presents for Spasms and cramps in her abdomen and legs. They seem to be similar. That is they feel about the same. The cramps sometimes seem to be related to the intestines.Requests GI, Dr. Toy Cookey, in [place] if referral required. She does have chronic loose bowels. Several loose bowel movements per day. Onset is remote. Bowel movements tend be loose to watery accompanied by some abdominal pain but no rectal pain or bleeding. Cramping in the legs occurs during the night. Feels like charley horses. Has to get up and walk them off. Sometimes she can get rid of them by rubbing down her legs. They interrupt sleep they are quite upsetting to the patient. History Juli has a past medical history of Anxiety and Dementia.   She has a past surgical history that includes Appendectomy (1947); Colon surgery [phone]); and Joint replacement.   Her family history includes Arthritis in her brother, father, mother, and sister; Asthma in her father; Birth defects in her sister; COPD in her father; Cancer in her mother; Diabetes in her sister; Early death in her brother; Hearing loss in her sister; Heart disease in her brother; Hypertension in her brother; Mental retardation in her sister.She reports that she quit smoking about 31 years ago. She has a 0.75 pack-year smoking history. She has never used smokeless tobacco. She reports that she drinks about 0.6 oz of alcohol per week . She reports that she does not use drugs.  Current Outpatient Prescriptions on File Prior to Visit  Medication Sig Dispense Refill  . donepezil (ARICEPT) 10 MG tablet TAKE 1 TABLET EVERY DAY 90 tablet 0  . haloperidol (HALDOL) 0.5 MG tablet TAKE 1 TABLET TWICE A DAY AS NEEDED FOR  SEDATION 180 tablet 0  . haloperidol (HALDOL) 0.5 MG tablet TAKE 1 TABLET TWICE A DAY AS NEEDED FOR SEDATION 180 tablet 0  . HYDROcodone-acetaminophen (NORCO) 5-325 MG tablet Take 1-2 tablets by mouth every 6 (six) hours as needed for severe pain. 60 tablet 0  . HYDROcodone-acetaminophen (NORCO) 5-325 MG tablet Take 1 tablet by mouth every 6 (six) hours as needed. 40 tablet 0  . meclizine (ANTIVERT) 25 MG tablet TAKE 1 TABLET (25 MG TOTAL) BY MOUTH 3 (THREE) TIMES DAILY AS NEEDED FOR DIZZINESS. 30 tablet 0  . meloxicam (MOBIC) 7.5 MG tablet Take 1 tablet (7.5 mg total) by mouth daily. 30 tablet 0  . sertraline (ZOLOFT) 100 MG tablet TAKE 1 TABLET (100 MG TOTAL) BY MOUTH 2 (TWO) TIMES DAILY. 180 tablet 0  . traZODone (DESYREL) 50 MG tablet TAKE 1 TO 2 TABLETS AT BEDTIME AS NEEDED FOR SLEEP 30 tablet 2   No current facility-administered medications on file prior to visit.     ROS Review of Systems  Constitutional: Negative for activity change, appetite change and fever.  HENT: Negative for congestion, rhinorrhea and sore throat.   Eyes: Negative for visual disturbance.  Respiratory: Negative for cough and shortness of breath.   Cardiovascular: Negative for chest pain and palpitations.  Gastrointestinal: Positive for abdominal distention, abdominal pain and diarrhea. Negative for nausea and rectal pain.  Genitourinary: Negative for dysuria.  Musculoskeletal: Positive for myalgias. Negative for arthralgias.    Objective:  BP Marland Kitchen)  142/78   Pulse 71   Temp 97.8 F (36.6 C) (Oral)   Ht _0  (1.651 m)   Wt 148 lb (67.1 kg)   BMI 24.63 kg/m   Physical Exam  Constitutional: She is oriented to person, place, and time. She appears well-developed and well-nourished.  HENT:  Head: Normocephalic and atraumatic.  Cardiovascular: Normal rate and regular rhythm.   No murmur heard. Pulmonary/Chest: Effort normal and breath sounds normal.  Abdominal: Soft. Bowel sounds are normal. She exhibits  no mass. There is tenderness. There is no rebound and no guarding.  Neurological: She is alert and oriented to person, place, and time.  Skin: Skin is warm and dry.  Psychiatric: She has a normal mood and affect. Her behavior is normal.    Assessment & Plan:   Ithzel was seen today for spasms.  Diagnoses and all orders for this visit:  Generalized abdominal pain -     CBC with Differential/Platelet -     CMP14+EGFR -     Urinalysis, Complete -     DG Abd 2 Views; Future -     Lipase -     Cancel: CT ABDOMEN PELVIS W WO CONTRAST; Future -     Cancel: CT ABDOMEN PELVIS W CONTRAST; Future  Functional diarrhea -     Cancel: CT ABDOMEN PELVIS W WO CONTRAST; Future -     Cancel: CT ABDOMEN PELVIS W CONTRAST; Future  Leg cramps  Other orders -     rOPINIRole (REQUIP) 0.25 MG tablet; Take 1 tablet (0.25 mg total) by mouth 3 (three) times daily. For leg cramps -     Microscopic Examination -     Please Note   I have discontinued Ms. Rossitto's predniSONE. I am also having her start on rOPINIRole. Additionally, I am having her maintain her meloxicam, HYDROcodone-acetaminophen, meclizine, HYDROcodone-acetaminophen, traZODone, haloperidol, haloperidol, sertraline, and donepezil.  Meds ordered this encounter  Medications  . rOPINIRole (REQUIP) 0.25 MG tablet    Sig: Take 1 tablet (0.25 mg total) by mouth 3 (three) times daily. For leg cramps    Dispense:  90 tablet    Refill:  2     Follow-up: Return in about 2 weeks (around 04/07/2017) for Diarrhea, with Dr. Warrick Parisian.  Claretta Fraise, M.D.

## 2017-03-25 ENCOUNTER — Ambulatory Visit (HOSPITAL_COMMUNITY)
Admission: RE | Admit: 2017-03-25 | Discharge: 2017-03-25 | Disposition: A | Discharge status: Home / Self Care | Payer: Medicare HMO | Source: Ambulatory Visit | Attending: Pediatrics | Admitting: Pediatrics

## 2017-03-25 ENCOUNTER — Other Ambulatory Visit: Payer: Self-pay | Admitting: Pediatrics

## 2017-03-25 DIAGNOSIS — R109 Unspecified abdominal pain: Secondary | ICD-10-CM | POA: Diagnosis not present

## 2017-03-25 DIAGNOSIS — Z9049 Acquired absence of other specified parts of digestive tract: Secondary | ICD-10-CM | POA: Diagnosis not present

## 2017-03-25 DIAGNOSIS — K8020 Calculus of gallbladder without cholecystitis without obstruction: Secondary | ICD-10-CM | POA: Diagnosis not present

## 2017-03-25 DIAGNOSIS — I700 Atherosclerosis of aorta: Secondary | ICD-10-CM | POA: Insufficient documentation

## 2017-03-25 DIAGNOSIS — R1084 Generalized abdominal pain: Secondary | ICD-10-CM

## 2017-03-25 LAB — CMP14+EGFR
A/G RATIO: 1.6 (ref 1.2–2.2)
ALBUMIN: 4.8 g/dL — AB (ref 3.5–4.7)
ALT: 13 IU/L (ref 0–32)
AST: 32 IU/L (ref 0–40)
Alkaline Phosphatase: 160 IU/L — ABNORMAL HIGH (ref 39–117)
BUN / CREAT RATIO: 9 — AB (ref 12–28)
BUN: 10 mg/dL (ref 8–27)
Bilirubin Total: 0.5 mg/dL (ref 0.0–1.2)
CALCIUM: 8.8 mg/dL (ref 8.7–10.3)
CO2: 23 mmol/L (ref 18–29)
CREATININE: 1.12 mg/dL — AB (ref 0.57–1.00)
Chloride: 92 mmol/L — ABNORMAL LOW (ref 96–106)
GFR, EST AFRICAN AMERICAN: 53 mL/min/{1.73_m2} — AB (ref >59)
GFR, EST NON AFRICAN AMERICAN: 46 mL/min/{1.73_m2} — AB (ref >59)
Globulin, Total: 3 g/dL (ref 1.5–4.5)
Glucose: 121 mg/dL — ABNORMAL HIGH (ref 65–99)
POTASSIUM: 3.6 mmol/L (ref 3.5–5.2)
Sodium: 136 mmol/L (ref 134–144)
TOTAL PROTEIN: 7.8 g/dL (ref 6.0–8.5)

## 2017-03-25 LAB — CBC WITH DIFFERENTIAL/PLATELET
BASOS ABS: 0 10*3/uL (ref 0.0–0.2)
Basos: 0 %
EOS (ABSOLUTE): 0 10*3/uL (ref 0.0–0.4)
Eos: 0 %
HEMOGLOBIN: 12.1 g/dL (ref 11.1–15.9)
Hematocrit: 36.3 % (ref 34.0–46.6)
Immature Grans (Abs): 0 10*3/uL (ref 0.0–0.1)
Immature Granulocytes: 0 %
LYMPHS ABS: 1.8 10*3/uL (ref 0.7–3.1)
Lymphs: 23 %
MCH: 27.3 pg (ref 26.6–33.0)
MCHC: 33.3 g/dL (ref 31.5–35.7)
MCV: 82 fL (ref 79–97)
Monocytes Absolute: 0.8 10*3/uL (ref 0.1–0.9)
Monocytes: 10 %
NEUTROS ABS: 5.2 10*3/uL (ref 1.4–7.0)
Neutrophils: 67 %
Platelets: 258 10*3/uL (ref 150–379)
RBC: 4.43 x10E6/uL (ref 3.77–5.28)
RDW: 15.8 % — ABNORMAL HIGH (ref 12.3–15.4)
WBC: 7.8 10*3/uL (ref 3.4–10.8)

## 2017-03-25 LAB — LIPASE: Lipase: 53 U/L (ref 14–85)

## 2017-03-25 LAB — PLEASE NOTE

## 2017-03-25 MED ORDER — IOPAMIDOL (ISOVUE-300) INJECTION 61%
100.0000 mL | Freq: Once | INTRAVENOUS | Status: AC | PRN
Start: 2017-03-25 — End: 2017-03-25
  Administered 2017-03-25: 100 mL via INTRAVENOUS

## 2017-03-25 MED ORDER — IOPAMIDOL (ISOVUE-300) INJECTION 61%
INTRAVENOUS | Status: AC
  Administered 2017-03-25: 30 mL
  Filled 2017-03-25: qty 30

## 2017-04-01 ENCOUNTER — Ambulatory Visit (INDEPENDENT_AMBULATORY_CARE_PROVIDER_SITE_OTHER): Payer: Medicare HMO | Admitting: Family Medicine

## 2017-04-01 ENCOUNTER — Encounter: Payer: Self-pay | Admitting: Family Medicine

## 2017-04-01 VITALS — BP 121/71 | HR 70 | Temp 97.7°F | Ht 65.0 in | Wt 151.0 lb

## 2017-04-01 DIAGNOSIS — R7309 Other abnormal glucose: Secondary | ICD-10-CM

## 2017-04-01 LAB — BAYER DCA HB A1C WAIVED: HB A1C: 5.5 % (ref <7.0)

## 2017-04-01 NOTE — Progress Notes (Signed)
BP 121/71   Pulse 70   Temp 97.7 F (36.5 C) (Oral)   Ht 5\' 5"  (1.651 m)   Wt 151 lb (68.5 kg)   BMI 25.13 kg/m    Subjective:    Patient ID: Mallory Steele, female    DOB: [birth year], 83 y.o.   MRN: [id number]  HPI: Mallory Steele is a 83 y.o. female presenting on 04/01/2017 for Discuss labwork (daughter reports someone called and told them she is borderline diabetic)   HPI Elevated blood glucose Patient is coming today because she was told that she has elevated blood glucose and possibly prediabetes. Her blood glucose when she was seen a week ago was 122 and it does not look like an A1c was performed at that time. She says she was not fasting and under a lot of stress so she thinks that may have been what it is and wants to follow-up on this today. When we ran an A1c today and showed up as 5.5 and that she does not have diabetes or prediabetes.  Relevant past medical, surgical, family and social history reviewed and updated as indicated. Interim medical history since our last visit reviewed. Allergies and medications reviewed and updated.  Review of Systems  Constitutional: Negative for chills and fever.  HENT: Negative for congestion, ear discharge and ear pain.   Eyes: Negative for redness and visual disturbance.  Respiratory: Negative for chest tightness and shortness of breath.   Cardiovascular: Negative for chest pain and leg swelling.  Endocrine: Negative for polydipsia and polyuria.  Genitourinary: Negative for difficulty urinating and dysuria.  Musculoskeletal: Negative for back pain and gait problem.  Skin: Negative for rash.  Neurological: Negative for light-headedness and headaches.  Psychiatric/Behavioral: Negative for agitation and behavioral problems.  All other systems reviewed and are negative.   Per HPI unless specifically indicated above        Objective:    BP 121/71   Pulse 70   Temp 97.7 F (36.5 C) (Oral)   Ht 5\' 5"  (1.651 m)   Wt 151 lb  (68.5 kg)   BMI 25.13 kg/m   Wt Readings from Last 3 Encounters:  04/01/17 151 lb (68.5 kg)  03/24/17 148 lb (67.1 kg)  12/06/16 146 lb (66.2 kg)    Physical Exam  Constitutional: She is oriented to person, place, and time. She appears well-developed and well-nourished. No distress.  Eyes: Conjunctivae are normal.  Cardiovascular: Normal rate, regular rhythm, normal heart sounds and intact distal pulses.   No murmur heard. Pulmonary/Chest: Effort normal and breath sounds normal. No respiratory distress. She has no wheezes.  Musculoskeletal: Normal range of motion. She exhibits no edema or tenderness.  Neurological: She is alert and oriented to person, place, and time. Coordination normal.  Skin: Skin is warm and dry. No rash noted. She is not diaphoretic.  Psychiatric: She has a normal mood and affect. Her behavior is normal.  Nursing note and vitals reviewed.   Hemoglobin A1c: 5.5    Assessment & Plan:   Problem List Items Addressed This Visit    None    Visit Diagnoses    Elevated glucose    -  Primary   Hemoglobin A1c is 5.5 which is normal, and patient does not have diabetes and reassured her.   Relevant Orders   Bayer DCA Hb A1c Waived (Completed)       Follow up plan: Return if symptoms worsen or fail to improve.  Counseling provided for all  of the vaccine components Orders Placed This Encounter  Procedures  . B[facility] Hb A1c [place], MD [facility] 04/01/2017, 1:50 PM

## 2017-06-17 ENCOUNTER — Other Ambulatory Visit: Payer: Self-pay | Admitting: Family Medicine

## 2017-06-17 DIAGNOSIS — F0391 Unspecified dementia with behavioral disturbance: Secondary | ICD-10-CM

## 2017-06-17 DIAGNOSIS — F03918 Unspecified dementia, unspecified severity, with other behavioral disturbance: Secondary | ICD-10-CM

## 2017-06-19 ENCOUNTER — Other Ambulatory Visit: Payer: Self-pay | Admitting: Family Medicine

## 2017-06-19 DIAGNOSIS — R252 Cramp and spasm: Secondary | ICD-10-CM

## 2017-07-12 ENCOUNTER — Other Ambulatory Visit: Payer: Self-pay | Admitting: Family Medicine

## 2017-08-22 ENCOUNTER — Other Ambulatory Visit: Payer: Self-pay | Admitting: Family Medicine

## 2017-08-22 DIAGNOSIS — H8113 Benign paroxysmal vertigo, bilateral: Secondary | ICD-10-CM

## 2017-08-22 DIAGNOSIS — F0391 Unspecified dementia with behavioral disturbance: Secondary | ICD-10-CM

## 2017-08-22 DIAGNOSIS — F03918 Unspecified dementia, unspecified severity, with other behavioral disturbance: Secondary | ICD-10-CM

## 2017-09-15 ENCOUNTER — Other Ambulatory Visit: Payer: Self-pay | Admitting: Family Medicine

## 2017-09-15 DIAGNOSIS — F03918 Unspecified dementia, unspecified severity, with other behavioral disturbance: Secondary | ICD-10-CM

## 2017-09-15 DIAGNOSIS — F0391 Unspecified dementia with behavioral disturbance: Secondary | ICD-10-CM

## 2017-09-15 NOTE — Telephone Encounter (Signed)
Last seen 04/01/17  Dr Dettinger

## 2017-09-16 ENCOUNTER — Other Ambulatory Visit: Payer: Self-pay | Admitting: *Deleted

## 2017-09-16 DIAGNOSIS — R252 Cramp and spasm: Secondary | ICD-10-CM

## 2017-09-16 MED ORDER — ROPINIROLE HCL 0.25 MG PO TABS: 0.2500 mg | ORAL_TABLET | Freq: Three times a day (TID) | ORAL | 1 refills | Status: DC

## 2017-09-20 ENCOUNTER — Other Ambulatory Visit: Payer: Self-pay | Admitting: *Deleted

## 2017-09-22 ENCOUNTER — Other Ambulatory Visit: Payer: Self-pay | Admitting: *Deleted

## 2017-09-22 DIAGNOSIS — R252 Cramp and spasm: Secondary | ICD-10-CM

## 2017-09-22 MED ORDER — ROPINIROLE HCL 0.25 MG PO TABS: 0.2500 mg | ORAL_TABLET | Freq: Three times a day (TID) | ORAL | 0 refills | Status: DC

## 2017-09-30 ENCOUNTER — Encounter: Payer: Self-pay | Admitting: Family Medicine

## 2017-09-30 ENCOUNTER — Ambulatory Visit (INDEPENDENT_AMBULATORY_CARE_PROVIDER_SITE_OTHER): Payer: Medicare HMO | Admitting: Family Medicine

## 2017-09-30 VITALS — BP 130/64 | HR 71 | Temp 97.0°F | Ht 65.0 in | Wt 147.0 lb

## 2017-09-30 DIAGNOSIS — M79671 Pain in right foot: Secondary | ICD-10-CM | POA: Diagnosis not present

## 2017-09-30 MED ORDER — MELOXICAM 15 MG PO TABS: 15.0000 mg | ORAL_TABLET | Freq: Every day | ORAL | 0 refills | Status: DC

## 2017-09-30 NOTE — Progress Notes (Signed)
BP 130/64   Pulse 71   Temp (!) 97 F (36.1 C) (Oral)   Ht 5\' 5"  (1.651 m)   Wt 147 lb (66.7 kg)   BMI 24.46 kg/m    Subjective:    Patient ID: Mallory Steele, female    DOB: July 19, 1932, 83 y.o.   MRN: [id number]  HPI: Mallory Steele is a 83 y.o. female presenting on 09/30/2017 for Foot Pain (right foot, bottom of heel)   HPI Right heel pain Patient is coming in with right heel pain that has been bothering her over the past 3 weeks and has not been improving.  She has been taking Tylenol, the Tylenol has not been helping much.  The pain hurts right in the bottom of her right heel and hurts worse when she stands on it or walks on it and also when she pushes on it.  She says the pain is throbbing and sharp.  She has been trying to see if it would get better but it has not over the past 3 weeks.  Relevant past medical, surgical, family and social history reviewed and updated as indicated. Interim medical history since our last visit reviewed. Allergies and medications reviewed and updated.  Review of Systems  Constitutional: Negative for chills and fever.  Eyes: Negative for visual disturbance.  Respiratory: Negative for chest tightness and shortness of breath.   Cardiovascular: Negative for chest pain and leg swelling.  Musculoskeletal: Positive for arthralgias. Negative for back pain, gait problem and joint swelling.  Skin: Negative for color change and rash.  Neurological: Negative for light-headedness and headaches.  Psychiatric/Behavioral: Negative for agitation and behavioral problems.  All other systems reviewed and are negative.   Per HPI unless specifically indicated above        Objective:    BP 130/64   Pulse 71   Temp (!) 97 F (36.1 C) (Oral)   Ht 5\' 5"  (1.651 m)   Wt 147 lb (66.7 kg)   BMI 24.46 kg/m   Wt Readings from Last 3 Encounters:  09/30/17 147 lb (66.7 kg)  04/01/17 151 lb (68.5 kg)  03/24/17 148 lb (67.1 kg)    Physical Exam    Constitutional: She is oriented to person, place, and time. She appears well-developed and well-nourished. No distress.  Eyes: Conjunctivae are normal.  Musculoskeletal: Normal range of motion. She exhibits no edema.       Right foot: There is tenderness. There is normal range of motion, no bony tenderness and no swelling.       Feet:  Neurological: She is alert and oriented to person, place, and time. Coordination normal.  Skin: Skin is warm and dry. No rash noted. She is not diaphoretic.  Psychiatric: She has a normal mood and affect. Her behavior is normal.  Nursing note and vitals reviewed.       Assessment & Plan:   Problem List Items Addressed This Visit    None    Visit Diagnoses    Inflammatory heel pain, right    -  Primary   Likely related to a bone spur versus plantar fasciitis, will do Mobic an referral to podiatry   Relevant Orders   Ambulatory referral to Podiatry       Follow up plan: Return if symptoms worsen or fail to improve.  Counseling provided for all of the vaccine components Orders Placed This Encounter  Procedures  . Ambulatory referral to Podiatry    Caryl Pina, MD [facility]  [place] 09/30/2017, 10:41 AM

## 2017-10-05 ENCOUNTER — Telehealth: Payer: Self-pay | Admitting: Family Medicine

## 2017-10-05 NOTE — Telephone Encounter (Signed)
[facility] [place]  [facility] 35 tablets available.  The pharmacist checked and it looks like [facility] may be trying to give her a partial fill also.  [place] pharmacist will contact [employer] and coordinate and contact patient to let them know what arrangements have been worked out.  I left message for patient to return call.

## 2017-10-05 NOTE — Telephone Encounter (Signed)
Just call another pharmacy and see if they have it and she can get it somewhere else this time.

## 2017-10-05 NOTE — Telephone Encounter (Signed)
Please review and advise.

## 2017-10-06 ENCOUNTER — Encounter: Payer: Medicare HMO | Admitting: Podiatry

## 2017-10-06 ENCOUNTER — Ambulatory Visit: Payer: Medicare HMO

## 2017-10-06 DIAGNOSIS — M722 Plantar fascial fibromatosis: Secondary | ICD-10-CM

## 2017-10-06 NOTE — Progress Notes (Signed)
This encounter was created in error - please disregard.

## 2017-10-15 ENCOUNTER — Other Ambulatory Visit: Payer: Self-pay | Admitting: Family Medicine

## 2017-10-21 ENCOUNTER — Other Ambulatory Visit: Payer: Self-pay

## 2017-10-21 DIAGNOSIS — F0391 Unspecified dementia with behavioral disturbance: Secondary | ICD-10-CM

## 2017-10-21 DIAGNOSIS — F03918 Unspecified dementia, unspecified severity, with other behavioral disturbance: Secondary | ICD-10-CM

## 2017-10-21 MED ORDER — HALOPERIDOL 0.5 MG PO TABS: ORAL_TABLET | ORAL | 0 refills | Status: DC

## 2017-10-21 NOTE — Telephone Encounter (Signed)
Last seen 10/05/17  Dr Dettinger

## 2017-10-24 ENCOUNTER — Ambulatory Visit: Payer: Medicare HMO | Admitting: Family Medicine

## 2017-10-25 ENCOUNTER — Encounter: Payer: Self-pay | Admitting: Family Medicine

## 2017-10-25 ENCOUNTER — Ambulatory Visit (INDEPENDENT_AMBULATORY_CARE_PROVIDER_SITE_OTHER): Payer: Medicare HMO | Admitting: Family Medicine

## 2017-10-25 VITALS — BP 132/71 | HR 67 | Temp 97.3°F | Ht 65.0 in | Wt 156.0 lb

## 2017-10-25 DIAGNOSIS — F331 Major depressive disorder, recurrent, moderate: Secondary | ICD-10-CM

## 2017-10-25 DIAGNOSIS — G301 Alzheimer's disease with late onset: Secondary | ICD-10-CM

## 2017-10-25 DIAGNOSIS — F0281 Dementia in other diseases classified elsewhere with behavioral disturbance: Secondary | ICD-10-CM

## 2017-10-25 DIAGNOSIS — F02818 Dementia in other diseases classified elsewhere, unspecified severity, with other behavioral disturbance: Secondary | ICD-10-CM

## 2017-10-25 MED ORDER — DULOXETINE HCL 30 MG PO CPEP: 30.0000 mg | ORAL_CAPSULE | Freq: Every day | ORAL | 2 refills | Status: DC

## 2017-10-25 NOTE — Progress Notes (Signed)
BP 132/71   Pulse 67   Temp (!) 97.3 F (36.3 C) (Oral)   Ht 5\' 5"  (1.651 m)   Wt 156 lb (70.8 kg)   BMI 25.96 kg/m    Subjective:    Patient ID: Lorenso Quarry, female    DOB: 08/11/1932, 82 y.o.   MRN: [id number]  HPI: DELLE ANDRZEJEWSKI is a 83 y.o. female presenting on 10/25/2017 for Medication Refill and Depression   HPI Depression and medication refill Patient is coming in for follow-up on depression medication refill.  She says that she has been having more sadness and just not feeling like getting up and is getting more frustrated with her memory issues.  Her daughter is here with her and confirms the same and says that she is just been down a lot more and more irritable and more anxious and is getting harder to deal with her mood outbursts.  She is currently taking Zoloft and haloperidol.  They feel like the Zoloft is not working much anymore.  She feels like she would be better off dead but denies any thoughts of suicide or actually hurting herself.  She does not want to suffer through dementia like her parents did.  This has been gradually worsening over the past 3 months Depression screen [facility] 2/9 10/25/2017 09/30/2017 04/01/2017 03/24/2017 12/06/2016  Decreased Interest 2 0 0 0 0  Down, Depressed, Hopeless 3 0 - 0 0  PHQ - 2 Score 5 0 0 0 0  Altered sleeping 0 - - - -  Tired, decreased energy 2 - - - -  Change in appetite 0 - - - -  Feeling bad or failure about yourself  2 - - - -  Trouble concentrating 3 - - - -  Moving slowly or fidgety/restless 2 - - - -  Suicidal thoughts 2 - - - -  PHQ-9 Score 16 - - - -  Difficult doing work/chores Very difficult - - - -     Relevant past medical, surgical, family and social history reviewed and updated as indicated. Interim medical history since our last visit reviewed. Allergies and medications reviewed and updated.  Review of Systems  Constitutional: Negative for chills and fever.  Eyes: Negative for visual disturbance.    Respiratory: Negative for chest tightness and shortness of breath.   Cardiovascular: Negative for chest pain and leg swelling.  Musculoskeletal: Negative for back pain and gait problem.  Skin: Negative for rash.  Neurological: Negative for light-headedness and headaches.  Psychiatric/Behavioral: Positive for agitation, behavioral problems and dysphoric mood. Negative for self-injury, sleep disturbance and suicidal ideas. The patient is nervous/anxious.   All other systems reviewed and are negative.   Per HPI unless specifically indicated above        Objective:    BP 132/71   Pulse 67   Temp (!) 97.3 F (36.3 C) (Oral)   Ht 5\' 5"  (1.651 m)   Wt 156 lb (70.8 kg)   BMI 25.96 kg/m   Wt Readings from Last 3 Encounters:  10/25/17 156 lb (70.8 kg)  09/30/17 147 lb (66.7 kg)  04/01/17 151 lb (68.5 kg)    Physical Exam  Constitutional: She is oriented to person, place, and time. She appears well-developed and well-nourished. No distress.  Eyes: Conjunctivae are normal.  Neck: Neck supple. No thyromegaly present.  Cardiovascular: Normal rate, regular rhythm, normal heart sounds and intact distal pulses.  No murmur heard. Pulmonary/Chest: Effort normal and breath sounds normal. No  respiratory distress. She has no wheezes. She has no rales.  Musculoskeletal: Normal range of motion.  Lymphadenopathy:    She has no cervical adenopathy.  Neurological: She is alert and oriented to person, place, and time. Coordination normal.  Skin: Skin is warm and dry. No rash noted. She is not diaphoretic.  Psychiatric: Her behavior is normal. Her mood appears anxious. Cognition and memory are impaired. She exhibits a depressed mood. She expresses no suicidal ideation. She expresses no suicidal plans.  Nursing note and vitals reviewed.     Assessment & Plan:   Problem List Items Addressed This Visit      Nervous and Auditory   Dementia   Relevant Medications   DULoxetine (CYMBALTA) 30 MG  capsule     Other   Depression - Primary   Relevant Medications   DULoxetine (CYMBALTA) 30 MG capsule       Follow up plan: Return in about 8 weeks (around 12/20/2017), or if symptoms worsen or fail to improve, for Follow-up depression.  Counseling provided for all of the vaccine components No orders of the defined types were placed in this encounter.   Caryl Pina, MD [facility] 10/25/2017, 9:50 AM

## 2017-10-27 ENCOUNTER — Other Ambulatory Visit: Payer: Self-pay | Admitting: Family Medicine

## 2017-11-02 ENCOUNTER — Other Ambulatory Visit: Payer: Self-pay | Admitting: Family Medicine

## 2017-11-02 ENCOUNTER — Telehealth: Payer: Self-pay | Admitting: Family Medicine

## 2017-11-02 ENCOUNTER — Other Ambulatory Visit: Payer: Self-pay

## 2017-11-02 MED ORDER — RISPERIDONE 0.25 MG PO TABS: 0.2500 mg | ORAL_TABLET | Freq: Two times a day (BID) | ORAL | 3 refills | Status: DC | PRN

## 2017-11-02 MED ORDER — TRAZODONE HCL 50 MG PO TABS
ORAL_TABLET | ORAL | 0 refills | Status: DC
Start: 2017-11-02 — End: 2017-12-13

## 2017-11-02 NOTE — Telephone Encounter (Signed)
What is the name of the medication? haloperidol  Have you contacted your pharmacy to request a refill? Yes, but [facility] and all nearby pharmacies are out too and even in [place], and on back order  Which pharmacy would you like this sent to? Wants to know what to do since it is on back order and pt needs it    Patient notified that their request is being sent to the clinical staff for review and that they should receive a call once it is complete. If they do not receive a call within 24 hours they can check with their pharmacy or our office.

## 2017-11-02 NOTE — Progress Notes (Unsigned)
Haloperidol is out of stock and have sent Risperdal instead Caryl Pina, MD [facility] 11/02/2017, 4:54 PM

## 2017-11-09 ENCOUNTER — Encounter: Payer: Self-pay | Admitting: Pediatrics

## 2017-11-09 ENCOUNTER — Ambulatory Visit (INDEPENDENT_AMBULATORY_CARE_PROVIDER_SITE_OTHER): Payer: Medicare HMO | Admitting: Pediatrics

## 2017-11-09 VITALS — BP 131/77 | HR 73 | Temp 97.5°F | Ht 65.0 in | Wt 159.0 lb

## 2017-11-09 DIAGNOSIS — R0789 Other chest pain: Secondary | ICD-10-CM | POA: Diagnosis not present

## 2017-11-09 DIAGNOSIS — R1032 Left lower quadrant pain: Secondary | ICD-10-CM

## 2017-11-09 NOTE — Progress Notes (Signed)
  Subjective:   Patient ID: Mallory Steele, female    DOB: 1931-11-26, 83 y.o.   MRN: [id number] CC: Abdominal Pain (pt here today c/o stomach pain ever since she fell last year and broke her nose and both arms but has worsened over the last week so much that she didn't want to put a bra on.)  HPI: Mallory Steele is a 83 y.o. female presenting for Abdominal Pain (pt here today c/o stomach pain ever since she fell last year and broke her nose and both arms but has worsened over the last week so much that she didn't want to put a bra on.) Here today with her daughter Says she has had stomach pain for the last year She fell and broke both her arms and her nose about a year ago Since then has also had pain in her mid chest when she bends over Here today for evaluation No fevers Mood has been depressed, following with her primary care doctor Has dumping syndrome due to prior colon surgeries Has had about 4 bowel movements today, goes after every time she eats No change from prior No blood in her stools  No recent falls No change in activities Last week she did walk about a mile over to her old apartment building Usually daughter is with her all the time for dementia  Relevant past medical, surgical, family and social history reviewed. Allergies and medications reviewed and updated. Social History   Tobacco Use  Smoking Status Former Smoker  . Packs/day: 0.25  . Years: 3.00  . Pack years: 0.75  . Last attempt to quit: 09/22/1985  . Years since quitting: 32.1  Smokeless Tobacco Never Used   ROS: Per HPI   Objective:    BP 131/77   Pulse 73   Temp (!) 97.5 F (36.4 C) (Oral)   Ht '5\' 5"'$  (1.651 m)   Wt 159 lb (72.1 kg)   BMI 26.46 kg/m   Wt Readings from Last 3 Encounters:  11/09/17 159 lb (72.1 kg)  10/25/17 156 lb (70.8 kg)  09/30/17 147 lb (66.7 kg)    Gen: NAD, alert, cooperative with exam, NCAT EYES: EOMI, no conjunctival injection, or no icterus CV: NRRR, normal  S1/S2 Resp: CTABL, no wheezes, normal WOB Abd: +BS, soft, mildly tender left lower quadrant with palpation, ND. no guarding or organomegaly Ext: No edema, warm Neuro: Alert and oriented, strength equal b/l UE and LE, coordination grossly normal MSK: Point tenderness over distal third sternum No point tenderness over spine, ribs bilaterally posteriorly, mid axillary line Psych: Slightly flat affect  Assessment & Plan:  [name] was seen today for abdominal pain.  Diagnoses and all orders for this visit:  Sternum pain Will return tomorrow for x-rays -     DG Sternum; Future -     DG Chest 2 View; Future  Left lower quadrant pain Pain is not changed, stool habits have not changed, patient is worried about it Will recheck labs -     CMP14+EGFR -     CBC with Differential/Platelet  Follow up plan: As scheduled with PCP Assunta Found, MD [place]

## 2017-11-10 DIAGNOSIS — F419 Anxiety disorder, unspecified: Secondary | ICD-10-CM | POA: Diagnosis not present

## 2017-11-10 DIAGNOSIS — K660 Peritoneal adhesions (postprocedural) (postinfection): Secondary | ICD-10-CM | POA: Diagnosis not present

## 2017-11-10 DIAGNOSIS — R1013 Epigastric pain: Secondary | ICD-10-CM | POA: Diagnosis not present

## 2017-11-10 DIAGNOSIS — K8080 Other cholelithiasis without obstruction: Secondary | ICD-10-CM | POA: Diagnosis not present

## 2017-11-10 DIAGNOSIS — F0280 Dementia in other diseases classified elsewhere without behavioral disturbance: Secondary | ICD-10-CM | POA: Diagnosis not present

## 2017-11-10 DIAGNOSIS — F329 Major depressive disorder, single episode, unspecified: Secondary | ICD-10-CM | POA: Diagnosis not present

## 2017-11-10 DIAGNOSIS — K8020 Calculus of gallbladder without cholecystitis without obstruction: Secondary | ICD-10-CM | POA: Diagnosis not present

## 2017-11-10 DIAGNOSIS — G309 Alzheimer's disease, unspecified: Secondary | ICD-10-CM | POA: Diagnosis not present

## 2017-11-10 DIAGNOSIS — D649 Anemia, unspecified: Secondary | ICD-10-CM | POA: Diagnosis not present

## 2017-11-10 DIAGNOSIS — R9431 Abnormal electrocardiogram [ECG] [EKG]: Secondary | ICD-10-CM | POA: Diagnosis not present

## 2017-11-10 DIAGNOSIS — Z79899 Other long term (current) drug therapy: Secondary | ICD-10-CM | POA: Diagnosis not present

## 2017-11-10 DIAGNOSIS — K8010 Calculus of gallbladder with chronic cholecystitis without obstruction: Secondary | ICD-10-CM | POA: Diagnosis not present

## 2017-11-10 DIAGNOSIS — R0602 Shortness of breath: Secondary | ICD-10-CM | POA: Diagnosis not present

## 2017-11-10 DIAGNOSIS — Z87891 Personal history of nicotine dependence: Secondary | ICD-10-CM | POA: Diagnosis not present

## 2017-11-10 LAB — CBC WITH DIFFERENTIAL/PLATELET
Basophils Absolute: 0 10*3/uL (ref 0.0–0.2)
Basos: 0 %
EOS (ABSOLUTE): 0.1 10*3/uL (ref 0.0–0.4)
EOS: 2 %
HEMATOCRIT: 32.7 % — AB (ref 34.0–46.6)
HEMOGLOBIN: 10.2 g/dL — AB (ref 11.1–15.9)
Immature Grans (Abs): 0 10*3/uL (ref 0.0–0.1)
Immature Granulocytes: 0 %
LYMPHS ABS: 2.5 10*3/uL (ref 0.7–3.1)
Lymphs: 41 %
MCH: 25.4 pg — AB (ref 26.6–33.0)
MCHC: 31.2 g/dL — AB (ref 31.5–35.7)
MCV: 82 fL (ref 79–97)
MONOCYTES: 11 %
MONOS ABS: 0.7 10*3/uL (ref 0.1–0.9)
Neutrophils Absolute: 2.8 10*3/uL (ref 1.4–7.0)
Neutrophils: 46 %
Platelets: 180 10*3/uL (ref 150–379)
RBC: 4.01 x10E6/uL (ref 3.77–5.28)
RDW: 17.5 % — AB (ref 12.3–15.4)
WBC: 6.1 10*3/uL (ref 3.4–10.8)

## 2017-11-10 LAB — CMP14+EGFR
ALBUMIN: 4.2 g/dL (ref 3.5–4.7)
ALK PHOS: 131 IU/L — AB (ref 39–117)
ALT: 11 IU/L (ref 0–32)
AST: 19 IU/L (ref 0–40)
Albumin/Globulin Ratio: 1.7 (ref 1.2–2.2)
BILIRUBIN TOTAL: 0.2 mg/dL (ref 0.0–1.2)
BUN / CREAT RATIO: 13 (ref 12–28)
BUN: 13 mg/dL (ref 8–27)
CHLORIDE: 103 mmol/L (ref 96–106)
CO2: 22 mmol/L (ref 20–29)
CREATININE: 1.04 mg/dL — AB (ref 0.57–1.00)
Calcium: 8.7 mg/dL (ref 8.7–10.3)
GFR calc Af Amer: 57 mL/min/{1.73_m2} — ABNORMAL LOW (ref >59)
GFR calc non Af Amer: 50 mL/min/{1.73_m2} — ABNORMAL LOW (ref >59)
GLOBULIN, TOTAL: 2.5 g/dL (ref 1.5–4.5)
Glucose: 98 mg/dL (ref 65–99)
Potassium: 4.5 mmol/L (ref 3.5–5.2)
SODIUM: 139 mmol/L (ref 134–144)
TOTAL PROTEIN: 6.7 g/dL (ref 6.0–8.5)

## 2017-11-17 ENCOUNTER — Encounter: Payer: Self-pay | Admitting: *Deleted

## 2017-11-21 DIAGNOSIS — J939 Pneumothorax, unspecified: Secondary | ICD-10-CM | POA: Diagnosis not present

## 2017-11-21 DIAGNOSIS — R0602 Shortness of breath: Secondary | ICD-10-CM | POA: Diagnosis not present

## 2017-11-21 DIAGNOSIS — J42 Unspecified chronic bronchitis: Secondary | ICD-10-CM | POA: Diagnosis not present

## 2017-11-23 DIAGNOSIS — K811 Chronic cholecystitis: Secondary | ICD-10-CM | POA: Insufficient documentation

## 2017-11-29 ENCOUNTER — Other Ambulatory Visit: Payer: Self-pay | Admitting: Family Medicine

## 2017-11-29 ENCOUNTER — Other Ambulatory Visit: Payer: Self-pay | Admitting: *Deleted

## 2017-11-29 MED ORDER — RISPERIDONE 0.25 MG PO TABS: 0.2500 mg | ORAL_TABLET | Freq: Two times a day (BID) | ORAL | 0 refills | Status: DC | PRN

## 2017-12-02 ENCOUNTER — Other Ambulatory Visit: Payer: Self-pay | Admitting: *Deleted

## 2017-12-02 MED ORDER — MELOXICAM 15 MG PO TABS: 15.0000 mg | ORAL_TABLET | Freq: Every day | ORAL | 1 refills | Status: DC

## 2017-12-13 ENCOUNTER — Other Ambulatory Visit: Payer: Self-pay | Admitting: Family Medicine

## 2017-12-13 ENCOUNTER — Other Ambulatory Visit: Payer: Self-pay

## 2017-12-13 DIAGNOSIS — F03918 Unspecified dementia, unspecified severity, with other behavioral disturbance: Secondary | ICD-10-CM

## 2017-12-13 DIAGNOSIS — F0391 Unspecified dementia with behavioral disturbance: Secondary | ICD-10-CM

## 2017-12-13 MED ORDER — TRAZODONE HCL 50 MG PO TABS: ORAL_TABLET | ORAL | 0 refills | Status: DC

## 2017-12-13 NOTE — Telephone Encounter (Signed)
Forward to PCP/Dr Dettinger

## 2017-12-13 NOTE — Telephone Encounter (Signed)
Last seen 11/09/17  Dr Clayton Bibles  Requesting 90 day supply

## 2017-12-14 ENCOUNTER — Other Ambulatory Visit: Payer: Self-pay | Admitting: Family Medicine

## 2017-12-16 ENCOUNTER — Encounter: Payer: Self-pay | Admitting: Family Medicine

## 2017-12-16 ENCOUNTER — Ambulatory Visit (INDEPENDENT_AMBULATORY_CARE_PROVIDER_SITE_OTHER): Payer: Medicare HMO | Admitting: Family Medicine

## 2017-12-16 VITALS — BP 136/76 | HR 72 | Temp 97.4°F | Ht 65.0 in | Wt 156.0 lb

## 2017-12-16 DIAGNOSIS — F331 Major depressive disorder, recurrent, moderate: Secondary | ICD-10-CM | POA: Diagnosis not present

## 2017-12-16 DIAGNOSIS — L02215 Cutaneous abscess of perineum: Secondary | ICD-10-CM | POA: Diagnosis not present

## 2017-12-16 MED ORDER — DULOXETINE HCL 60 MG PO CPEP: 120.0000 mg | ORAL_CAPSULE | Freq: Every day | ORAL | 2 refills | Status: DC

## 2017-12-16 MED ORDER — SULFAMETHOXAZOLE-TRIMETHOPRIM 800-160 MG PO TABS
1.0000 | ORAL_TABLET | Freq: Two times a day (BID) | ORAL | 0 refills | Status: DC
Start: 2017-12-16 — End: 2018-02-09

## 2017-12-16 MED ORDER — RISPERIDONE 0.5 MG PO TABS: 0.5000 mg | ORAL_TABLET | Freq: Two times a day (BID) | ORAL | 1 refills | Status: DC | PRN

## 2017-12-16 NOTE — Progress Notes (Signed)
BP 136/76   Pulse 72   Temp (!) 97.4 F (36.3 C) (Oral)   Ht 5\' 5"  (1.651 m)   Wt 156 lb (70.8 kg)   BMI 25.96 kg/m    Subjective:    Patient ID: Mallory Steele, female    DOB: Apr 19, 1932, 84 y.o.   MRN: [id number]  HPI: Mallory Steele is a 84 y.o. female presenting on 12/16/2017 for Lesion near vagina and Cymbalta needs to be increased   HPI Abscess near her vagina Patient is coming in today with complaints of an abscess or a spot that starting to form on the left side of her groin near her vagina.  She is brought in by her daughter who is giving most of the history because she has dementia.  She says that she just told her about this spot a few days ago but she thinks it may have been there for some time.  Patient is very embarrassed about the area and does not want to show it all the time but it is enough painful that she will let us take a look at it.  She denies any fevers or chills.  She says that there is some redness and warmth to the spot per daughter and says that she has not gotten any drainage out of it to this point.  Depression and mood disorder secondary to dementia Patient is brought in by daughter who says that she has been having a lot of more issues with her mood and anxiety and behavioral.  She is currently on Zoloft 100 and Cymbalta 30 and the daughter says neither 1 of them seem to be helping anymore very much.  She is also been on Risperdal 0.25 twice daily.  She was previously on Haldol but we could not find it in the pharmacies.  Patient has been doing a lot worse since these changes were made.  The daughter says that she frequently says she feels like she would be better off dead and a lot of this stems because of her memory issues.  She is also been having a lot of decreased energy and overeating and feeling bad about herself.  Most of these questionnaire was answered by the daughter. Depression screen [facility] 2/9 12/16/2017 11/09/2017 10/25/2017 09/30/2017 04/01/2017    Decreased Interest 3 2 2  0 0  Down, Depressed, Hopeless 3 3 3  0 -  PHQ - 2 Score 6 5 5  0 0  Altered sleeping 3 0 0 - -  Tired, decreased energy 3 2 2  - -  Change in appetite 3 0 0 - -  Feeling bad or failure about yourself  3 2 2  - -  Trouble concentrating 3 3 3  - -  Moving slowly or fidgety/restless 0 2 2 - -  Suicidal thoughts 3 2 2  - -  PHQ-9 Score 24 16 16  - -  Difficult doing work/chores Somewhat difficult Very difficult Very difficult - -     Relevant past medical, surgical, family and social history reviewed and updated as indicated. Interim medical history since our last visit reviewed. Allergies and medications reviewed and updated.  Review of Systems  Constitutional: Negative for chills and fever.  Eyes: Negative for visual disturbance.  Respiratory: Negative for chest tightness and shortness of breath.   Cardiovascular: Negative for chest pain and leg swelling.  Musculoskeletal: Negative for back pain and gait problem.  Skin: Positive for color change. Negative for rash.  Neurological: Negative for dizziness, light-headedness and headaches.  Psychiatric/Behavioral: Positive for decreased concentration, dysphoric mood, sleep disturbance and suicidal ideas. Negative for agitation, behavioral problems and self-injury. The patient is nervous/anxious.   All other systems reviewed and are negative.   Per HPI unless specifically indicated above   Allergies as of 12/16/2017   No Known Allergies     Medication List        Accurate as of 12/16/17  1:41 PM. Always use your most recent med list.          donepezil 10 MG tablet Commonly known as:  ARICEPT TAKE 1 TABLET BY MOUTH EVERY DAY   DULoxetine 60 MG capsule Commonly known as:  CYMBALTA Take 2 capsules (120 mg total) by mouth daily.   meloxicam 15 MG tablet Commonly known as:  MOBIC Take 1 tablet (15 mg total) by mouth daily.   risperiDONE 0.5 MG tablet Commonly known as:  RISPERDAL Take 1 tablet (0.5 mg  total) by mouth 2 (two) times daily as needed.   rOPINIRole 0.25 MG tablet Commonly known as:  REQUIP Take 1 tablet (0.25 mg total) by mouth 3 (three) times daily. For leg cramps   sulfamethoxazole-trimethoprim 800-160 MG tablet Commonly known as:  BACTRIM DS,SEPTRA DS Take 1 tablet by mouth 2 (two) times daily.   traZODone 50 MG tablet Commonly known as:  DESYREL TAKE 1 TO 2 TABLETS AT BEDTIME AS NEEDED FOR SLEEP          Objective:    BP 136/76   Pulse 72   Temp (!) 97.4 F (36.3 C) (Oral)   Ht 5\' 5"  (1.651 m)   Wt 156 lb (70.8 kg)   BMI 25.96 kg/m   Wt Readings from Last 3 Encounters:  12/16/17 156 lb (70.8 kg)  11/09/17 159 lb (72.1 kg)  10/25/17 156 lb (70.8 kg)    Physical Exam  Constitutional: She is oriented to person, place, and time. She appears well-developed and well-nourished. No distress.  Eyes: Conjunctivae are normal.  Cardiovascular: Normal rate, regular rhythm, normal heart sounds and intact distal pulses.  No murmur heard. Pulmonary/Chest: Effort normal and breath sounds normal. No respiratory distress. She has no wheezes. She has no rales.  Musculoskeletal: Normal range of motion. She exhibits no edema.  Neurological: She is alert and oriented to person, place, and time. Coordination normal.  Skin: Skin is warm and dry. Lesion (Abscess just lateral to her left labia majora, palpable induration and fluctuation.) noted. No rash noted. She is not diaphoretic.  Psychiatric: She has a normal mood and affect. Her behavior is normal.  Nursing note and vitals reviewed.   I&D: Region was anesthetized with 2% lidocaine using about 3 mL of it. Incision was made on anterior medial aspect of the open wound. Significant serosanguineous and purulent drainage was exuded. Culture was taken. Forceps was used to probe the area and break apart any loculations.  Simple dressing with gauze and depends was placed to keep pressure over it. Bleeding was minimal and patient  tolerated procedure well.     Assessment & Plan:   Problem List Items Addressed This Visit      Other   Depression - Primary   Relevant Medications   risperiDONE (RISPERDAL) 0.5 MG tablet    Other Visit Diagnoses    Perineal abscess       Relevant Medications   sulfamethoxazole-trimethoprim (BACTRIM DS,SEPTRA DS) 800-160 MG tablet   Other Relevant Orders   Anaerobic and Aerobic Culture (Completed)       Follow  up plan: Return in about 3 months (around 03/16/2018), or if symptoms worsen or fail to improve, for Recheck anxiety depression.  Counseling provided for all of the vaccine components No orders of the defined types were placed in this encounter.   Caryl Pina, MD [facility] 12/16/2017, 1:41 PM

## 2017-12-19 ENCOUNTER — Other Ambulatory Visit: Payer: Self-pay

## 2017-12-19 DIAGNOSIS — F331 Major depressive disorder, recurrent, moderate: Secondary | ICD-10-CM

## 2017-12-19 MED ORDER — DULOXETINE HCL 60 MG PO CPEP: 120.0000 mg | ORAL_CAPSULE | Freq: Every day | ORAL | 0 refills | Status: DC

## 2017-12-20 LAB — ANAEROBIC AND AEROBIC CULTURE

## 2017-12-27 ENCOUNTER — Ambulatory Visit: Payer: Medicare HMO | Admitting: Family Medicine

## 2017-12-27 DIAGNOSIS — R1010 Upper abdominal pain, unspecified: Secondary | ICD-10-CM | POA: Diagnosis not present

## 2017-12-27 DIAGNOSIS — F0390 Unspecified dementia without behavioral disturbance: Secondary | ICD-10-CM | POA: Diagnosis not present

## 2017-12-27 DIAGNOSIS — Z87891 Personal history of nicotine dependence: Secondary | ICD-10-CM | POA: Diagnosis not present

## 2017-12-27 DIAGNOSIS — K5730 Diverticulosis of large intestine without perforation or abscess without bleeding: Secondary | ICD-10-CM | POA: Diagnosis not present

## 2017-12-27 DIAGNOSIS — F419 Anxiety disorder, unspecified: Secondary | ICD-10-CM | POA: Diagnosis not present

## 2017-12-27 DIAGNOSIS — R030 Elevated blood-pressure reading, without diagnosis of hypertension: Secondary | ICD-10-CM | POA: Diagnosis not present

## 2017-12-27 DIAGNOSIS — R1012 Left upper quadrant pain: Secondary | ICD-10-CM | POA: Diagnosis not present

## 2017-12-27 DIAGNOSIS — K859 Acute pancreatitis without necrosis or infection, unspecified: Secondary | ICD-10-CM | POA: Diagnosis not present

## 2017-12-27 DIAGNOSIS — R109 Unspecified abdominal pain: Secondary | ICD-10-CM | POA: Diagnosis not present

## 2017-12-27 DIAGNOSIS — T8110XA Postprocedural shock unspecified, initial encounter: Secondary | ICD-10-CM | POA: Diagnosis not present

## 2017-12-27 DIAGNOSIS — F329 Major depressive disorder, single episode, unspecified: Secondary | ICD-10-CM | POA: Diagnosis not present

## 2017-12-27 DIAGNOSIS — Z79899 Other long term (current) drug therapy: Secondary | ICD-10-CM | POA: Diagnosis not present

## 2017-12-27 DIAGNOSIS — R1011 Right upper quadrant pain: Secondary | ICD-10-CM | POA: Diagnosis not present

## 2017-12-27 DIAGNOSIS — Z9049 Acquired absence of other specified parts of digestive tract: Secondary | ICD-10-CM | POA: Diagnosis not present

## 2017-12-28 DIAGNOSIS — R1011 Right upper quadrant pain: Secondary | ICD-10-CM | POA: Diagnosis not present

## 2017-12-28 DIAGNOSIS — F329 Major depressive disorder, single episode, unspecified: Secondary | ICD-10-CM | POA: Diagnosis not present

## 2017-12-28 DIAGNOSIS — R1012 Left upper quadrant pain: Secondary | ICD-10-CM | POA: Diagnosis not present

## 2017-12-29 DIAGNOSIS — R1011 Right upper quadrant pain: Secondary | ICD-10-CM | POA: Diagnosis not present

## 2017-12-29 DIAGNOSIS — R1012 Left upper quadrant pain: Secondary | ICD-10-CM | POA: Diagnosis not present

## 2017-12-29 DIAGNOSIS — F329 Major depressive disorder, single episode, unspecified: Secondary | ICD-10-CM | POA: Diagnosis not present

## 2017-12-30 ENCOUNTER — Other Ambulatory Visit: Payer: Self-pay | Admitting: Family Medicine

## 2017-12-30 DIAGNOSIS — H8113 Benign paroxysmal vertigo, bilateral: Secondary | ICD-10-CM

## 2017-12-30 DIAGNOSIS — F03918 Unspecified dementia, unspecified severity, with other behavioral disturbance: Secondary | ICD-10-CM

## 2017-12-30 DIAGNOSIS — F0391 Unspecified dementia with behavioral disturbance: Secondary | ICD-10-CM

## 2018-01-03 ENCOUNTER — Other Ambulatory Visit: Payer: Self-pay | Admitting: Family Medicine

## 2018-01-03 DIAGNOSIS — F03918 Unspecified dementia, unspecified severity, with other behavioral disturbance: Secondary | ICD-10-CM

## 2018-01-03 DIAGNOSIS — F0391 Unspecified dementia with behavioral disturbance: Secondary | ICD-10-CM

## 2018-01-11 ENCOUNTER — Ambulatory Visit: Payer: Medicare HMO | Admitting: Family Medicine

## 2018-01-12 ENCOUNTER — Telehealth: Payer: Self-pay | Admitting: Family Medicine

## 2018-01-12 NOTE — Telephone Encounter (Signed)
appt scheduled for home health evaluation

## 2018-01-12 NOTE — Telephone Encounter (Signed)
I am good with going ahead and doing a home health referral but often times this requires a face-to-face in our office.  If that is the case she might have to come in, if it does not require face-to-face then go ahead and do the referral based on worsening dementia. Caryl Pina, MD [facility] 01/12/2018, 11:56 AM

## 2018-01-16 ENCOUNTER — Other Ambulatory Visit: Payer: Self-pay | Admitting: *Deleted

## 2018-01-16 DIAGNOSIS — F331 Major depressive disorder, recurrent, moderate: Secondary | ICD-10-CM

## 2018-01-16 MED ORDER — DULOXETINE HCL 60 MG PO CPEP: 120.0000 mg | ORAL_CAPSULE | Freq: Every day | ORAL | 0 refills | Status: DC

## 2018-01-18 ENCOUNTER — Other Ambulatory Visit: Payer: Self-pay | Admitting: Family Medicine

## 2018-01-18 DIAGNOSIS — F331 Major depressive disorder, recurrent, moderate: Secondary | ICD-10-CM

## 2018-01-20 ENCOUNTER — Ambulatory Visit: Payer: Medicare HMO | Admitting: Family Medicine

## 2018-01-27 ENCOUNTER — Ambulatory Visit: Payer: Medicare HMO | Admitting: Family Medicine

## 2018-02-01 ENCOUNTER — Ambulatory Visit: Payer: Medicare HMO | Admitting: Family Medicine

## 2018-02-09 ENCOUNTER — Encounter: Payer: Self-pay | Admitting: Family Medicine

## 2018-02-09 ENCOUNTER — Ambulatory Visit (INDEPENDENT_AMBULATORY_CARE_PROVIDER_SITE_OTHER): Payer: Medicare HMO | Admitting: Family Medicine

## 2018-02-09 VITALS — BP 120/69 | HR 89 | Temp 97.6°F | Ht 65.0 in | Wt 163.0 lb

## 2018-02-09 DIAGNOSIS — Z741 Need for assistance with personal care: Secondary | ICD-10-CM | POA: Diagnosis not present

## 2018-02-09 DIAGNOSIS — F0281 Dementia in other diseases classified elsewhere with behavioral disturbance: Secondary | ICD-10-CM | POA: Diagnosis not present

## 2018-02-09 DIAGNOSIS — Z23 Encounter for immunization: Secondary | ICD-10-CM | POA: Diagnosis not present

## 2018-02-09 DIAGNOSIS — G301 Alzheimer's disease with late onset: Secondary | ICD-10-CM

## 2018-02-09 DIAGNOSIS — F02818 Dementia in other diseases classified elsewhere, unspecified severity, with other behavioral disturbance: Secondary | ICD-10-CM

## 2018-02-09 MED ORDER — BACLOFEN 10 MG PO TABS: 10.0000 mg | ORAL_TABLET | Freq: Three times a day (TID) | ORAL | 0 refills | Status: DC

## 2018-02-09 NOTE — Progress Notes (Signed)
BP 120/69   Pulse 89   Temp 97.6 F (36.4 C) (Oral)   Ht 5\' 5"  (1.651 m)   Wt 163 lb (73.9 kg)   BMI 27.12 kg/m    Subjective:    Patient ID: Mallory Steele, female    DOB: 12-02-1931, 84 y.o.   MRN: [id number]  HPI: Mallory Steele is a 84 y.o. female presenting on 02/09/2018 for [place] (daughter wants help)   HPI Patient is brought in by her daughter today for worsening dementia and difficulty with ADLs, the daughter is coming in because she feels like she needs help with how bad patient's dementia has been.  Says that she cannot cook or clean for herself and she is having difficulty bending over for things and cannot put on her own shoes because of that and cannot do her own hair.  Patient cannot drive and has not been able to for some time.  Patient has frequent urinary accidents and wears depends.  Patient's memory has been worsening and she is very confused and forgets things all the time and they are concerned that they can no longer leave her home alone without fear of her either getting lost or something happening her while at home.  Relevant past medical, surgical, family and social history reviewed and updated as indicated. Interim medical history since our last visit reviewed. Allergies and medications reviewed and updated.  Review of Systems  Constitutional: Negative for chills, fatigue and fever.  Eyes: Negative for visual disturbance.  Respiratory: Negative for chest tightness and shortness of breath.   Cardiovascular: Negative for chest pain and leg swelling.  Genitourinary: Negative for difficulty urinating and dysuria.  Musculoskeletal: Negative for back pain and gait problem.  Skin: Negative for rash.  Neurological: Negative for dizziness, speech difficulty, weakness, light-headedness, numbness and headaches.  Psychiatric/Behavioral: Positive for confusion, decreased concentration and dysphoric mood. Negative for agitation, behavioral problems, self-injury,  sleep disturbance and suicidal ideas. The patient is nervous/anxious.   All other systems reviewed and are negative.   Per HPI unless specifically indicated above   Allergies as of 02/09/2018   No Known Allergies     Medication List        Accurate as of 02/09/18  1:59 PM. Always use your most recent med list.          donepezil 10 MG tablet Commonly known as:  ARICEPT TAKE 1 TABLET BY MOUTH EVERY DAY   donepezil 10 MG tablet Commonly known as:  ARICEPT TAKE 1 TABLET BY MOUTH EVERY DAY   DULoxetine 60 MG capsule Commonly known as:  CYMBALTA Take 2 capsules (120 mg total) by mouth daily.   meclizine 25 MG tablet Commonly known as:  ANTIVERT TAKE 1 TABLET (25 MG TOTAL) BY MOUTH 3 (THREE) TIMES DAILY AS NEEDED FOR DIZZINESS.   meloxicam 15 MG tablet Commonly known as:  MOBIC Take 1 tablet (15 mg total) by mouth daily.   risperiDONE 0.5 MG tablet Commonly known as:  RISPERDAL Take 1 tablet (0.5 mg total) by mouth 2 (two) times daily as needed.   rOPINIRole 0.25 MG tablet Commonly known as:  REQUIP Take 1 tablet (0.25 mg total) by mouth 3 (three) times daily. For leg cramps   traZODone 50 MG tablet Commonly known as:  DESYREL TAKE 1 TO 2 TABLETS AT BEDTIME AS NEEDED FOR SLEEP          Objective:    BP 120/69   Pulse 89  Temp 97.6 F (36.4 C) (Oral)   Ht 5\' 5"  (1.651 m)   Wt 163 lb (73.9 kg)   BMI 27.12 kg/m   Wt Readings from Last 3 Encounters:  02/09/18 163 lb (73.9 kg)  12/16/17 156 lb (70.8 kg)  11/09/17 159 lb (72.1 kg)    Physical Exam  Constitutional: She is oriented to person, place, and time. She appears well-developed and well-nourished. No distress.  Eyes: Conjunctivae are normal.  Neck: Neck supple. No thyromegaly present.  Cardiovascular: Normal rate, regular rhythm, normal heart sounds and intact distal pulses.  No murmur heard. Pulmonary/Chest: Effort normal and breath sounds normal. No respiratory distress. She has no wheezes.    Musculoskeletal: Normal range of motion.  Lymphadenopathy:    She has no cervical adenopathy.  Neurological: She is alert and oriented to person, place, and time. Coordination normal.  Skin: Skin is warm and dry. No rash noted. She is not diaphoretic.  Psychiatric: She has a normal mood and affect. Her behavior is normal. Cognition and memory are impaired. She expresses no suicidal ideation. She expresses no suicidal plans. She exhibits abnormal recent memory and abnormal remote memory.  Nursing note and vitals reviewed.      Assessment & Plan:   Problem List Items Addressed This Visit      Nervous and Auditory   Dementia - Primary   Relevant Orders   Home Health   Face-to-face encounter (required for Medicare/Medicaid patients)    Other Visit Diagnoses    Requires assistance with activities of daily living (ADL)       Relevant Orders   Home Health   Face-to-face encounter (required for Medicare/Medicaid patients)       Follow up plan: Return in about 6 months (around 08/12/2018), or if symptoms worsen or fail to improve.  Counseling provided for all of the vaccine components Orders Placed This Encounter  Procedures  . Home Health  . Face-to-face encounter (required for Medicare/Medicaid patients)    Caryl Pina, MD [facility] 02/09/2018, 1:59 PM

## 2018-02-09 NOTE — Addendum Note (Signed)
Addended by: Michaela Corner on: 02/09/2018 02:25 PM   Modules accepted: Orders

## 2018-02-22 ENCOUNTER — Other Ambulatory Visit: Payer: Self-pay | Admitting: Family Medicine

## 2018-02-22 NOTE — Telephone Encounter (Signed)
Last seen 02/09/17  Dr D

## 2018-02-25 ENCOUNTER — Other Ambulatory Visit: Payer: Self-pay | Admitting: Family Medicine

## 2018-03-03 DIAGNOSIS — R109 Unspecified abdominal pain: Secondary | ICD-10-CM | POA: Diagnosis not present

## 2018-03-03 DIAGNOSIS — R188 Other ascites: Secondary | ICD-10-CM | POA: Diagnosis not present

## 2018-03-03 DIAGNOSIS — I700 Atherosclerosis of aorta: Secondary | ICD-10-CM | POA: Diagnosis not present

## 2018-03-13 ENCOUNTER — Ambulatory Visit: Payer: Medicare HMO

## 2018-03-15 ENCOUNTER — Other Ambulatory Visit: Payer: Self-pay | Admitting: Family Medicine

## 2018-03-15 DIAGNOSIS — K811 Chronic cholecystitis: Secondary | ICD-10-CM | POA: Diagnosis not present

## 2018-03-20 ENCOUNTER — Encounter: Payer: Self-pay | Admitting: Family Medicine

## 2018-03-20 ENCOUNTER — Other Ambulatory Visit: Payer: Self-pay | Admitting: Family Medicine

## 2018-03-20 DIAGNOSIS — R252 Cramp and spasm: Secondary | ICD-10-CM

## 2018-03-24 ENCOUNTER — Other Ambulatory Visit: Payer: Self-pay | Admitting: Family Medicine

## 2018-03-30 ENCOUNTER — Other Ambulatory Visit: Payer: Self-pay | Admitting: Family Medicine

## 2018-03-30 DIAGNOSIS — F0391 Unspecified dementia with behavioral disturbance: Secondary | ICD-10-CM

## 2018-03-30 DIAGNOSIS — F03918 Unspecified dementia, unspecified severity, with other behavioral disturbance: Secondary | ICD-10-CM

## 2018-04-14 ENCOUNTER — Encounter: Payer: Self-pay | Admitting: Family Medicine

## 2018-04-14 ENCOUNTER — Ambulatory Visit (INDEPENDENT_AMBULATORY_CARE_PROVIDER_SITE_OTHER): Payer: Medicare HMO | Admitting: Family Medicine

## 2018-04-14 VITALS — BP 139/69 | HR 76 | Temp 98.4°F | Ht 65.0 in | Wt 166.0 lb

## 2018-04-14 DIAGNOSIS — L409 Psoriasis, unspecified: Secondary | ICD-10-CM

## 2018-04-14 DIAGNOSIS — R159 Full incontinence of feces: Secondary | ICD-10-CM

## 2018-04-14 MED ORDER — TRIAMCINOLONE ACETONIDE 0.1 % EX CREA: 1.0000 "application " | TOPICAL_CREAM | Freq: Two times a day (BID) | CUTANEOUS | 1 refills | Status: DC

## 2018-04-14 NOTE — Progress Notes (Signed)
BP 139/69   Pulse 76   Temp 98.4 F (36.9 C) (Oral)   Ht 5\' 5"  (1.651 m)   Wt 166 lb (75.3 kg)   BMI 27.62 kg/m    Subjective:    Patient ID: Mallory Steele, female    DOB: 1932/04/29, 84 y.o.   MRN: [id number]  HPI: DEBORH Steele is a 84 y.o. female presenting on 04/14/2018 for Psoriasis and Bowel control (daughter c/o mom refusing to wear Depends and having accidents)   HPI Psoriasis Patient has had a long time to treat flareups but she is starting to have her psoriasis flareup over the past couple weeks.  Daughter is here with her is mostly historian because patient has dementia.  She says that the psoriasis is on her lateral legs posterior elbows and buttocks.  It is been very pruritic and she scratches it and makes it bleed.  They have been using some over-the-counter lotions and cortisone cream without much success.  Denies having any fevers or chills or drainage.  Fecal incontinence Patient has been having fecal incontinence for quite some time and it is sporadic.  Daughter is here because patient is refusing to wear depends and wants [place] to talk to remove the wound and see if we can convince her to wear the depends.  After talking with her extensively about the accidents and the importance of wearing them to keep her from getting infections she seems agreeable but we will see what happens when she goes home.  Patient chronically is depressed and chronically marked positive for thoughts of better off dead but has no suicidal ideations and is mostly constantly watched by her daughter, this is deemed to be a effect of her dementia.  Relevant past medical, surgical, family and social history reviewed and updated as indicated. Interim medical history since our last visit reviewed. Allergies and medications reviewed and updated.  Review of Systems  Constitutional: Negative for chills and fever.  Eyes: Negative for visual disturbance.  Respiratory: Negative for chest tightness and  shortness of breath.   Cardiovascular: Negative for chest pain and leg swelling.  Gastrointestinal: Positive for diarrhea (Intermittent with incontinence).  Musculoskeletal: Negative for back pain and gait problem.  Skin: Positive for rash.  Neurological: Negative for light-headedness and headaches.  Psychiatric/Behavioral: Negative for agitation and behavioral problems.  All other systems reviewed and are negative.   Per HPI unless specifically indicated above   Allergies as of 04/14/2018   No Known Allergies     Medication List        Accurate as of 04/14/18  9:42 AM. Always use your most recent med list.          baclofen 10 MG tablet Commonly known as:  LIORESAL TAKE 1 TABLET BY MOUTH THREE TIMES A DAY   donepezil 10 MG tablet Commonly known as:  ARICEPT TAKE 1 TABLET BY MOUTH EVERY DAY   DULoxetine 60 MG capsule Commonly known as:  CYMBALTA Take 2 capsules (120 mg total) by mouth daily.   meclizine 25 MG tablet Commonly known as:  ANTIVERT TAKE 1 TABLET (25 MG TOTAL) BY MOUTH 3 (THREE) TIMES DAILY AS NEEDED FOR DIZZINESS.   meloxicam 15 MG tablet Commonly known as:  MOBIC Take 1 tablet (15 mg total) by mouth daily.   risperiDONE 0.5 MG tablet Commonly known as:  RISPERDAL Take 1 tablet (0.5 mg total) by mouth 2 (two) times daily as needed.   risperiDONE 0.25 MG tablet Commonly known  as:  RISPERDAL TAKE 1 TABLET (0.25 MG TOTAL) BY MOUTH 2 (TWO) TIMES DAILY AS NEEDED.   rOPINIRole 0.25 MG tablet Commonly known as:  REQUIP TAKE 1 TABLET (0.25 MG TOTAL) BY MOUTH 3 (THREE) TIMES DAILY. FOR LEG CRAMPS   traZODone 50 MG tablet Commonly known as:  DESYREL TAKE 1 TO 2 TABLETS AT BEDTIME AS NEEDED FOR SLEEP   triamcinolone cream 0.1 % Commonly known as:  KENALOG Apply 1 application topically 2 (two) times daily.          Objective:    BP 139/69   Pulse 76   Temp 98.4 F (36.9 C) (Oral)   Ht 5\' 5"  (1.651 m)   Wt 166 lb (75.3 kg)   BMI 27.62 kg/m     Wt Readings from Last 3 Encounters:  04/14/18 166 lb (75.3 kg)  02/09/18 163 lb (73.9 kg)  12/16/17 156 lb (70.8 kg)    Physical Exam  Constitutional: She is oriented to person, place, and time. She appears well-developed and well-nourished. No distress.  Eyes: Conjunctivae are normal.  Cardiovascular: Normal rate, regular rhythm, normal heart sounds and intact distal pulses.  No murmur heard. Pulmonary/Chest: Effort normal and breath sounds normal. No respiratory distress. She has no wheezes.  Neurological: She is alert and oriented to person, place, and time. Coordination normal.  Skin: Skin is warm and dry. Rash (Psoriatic rash on lateral aspects of both legs and posterior elbows and lower back and buttocks, no erythema or signs of infection) noted. She is not diaphoretic.  Psychiatric: She has a normal mood and affect. Her behavior is normal.  Nursing note and vitals reviewed.       Assessment & Plan:   Problem List Items Addressed This Visit    None    Visit Diagnoses    Psoriasis    -  Primary   Relevant Medications   triamcinolone cream (KENALOG) 0.1 %   Incontinence of feces, unspecified fecal incontinence type       Patient has stool incontinence with age and lack of control.  Daughter is trying to get her to wear depends all the time and we had a talk with her and she will       Follow up plan: Return if symptoms worsen or fail to improve.  Counseling provided for all of the vaccine components No orders of the defined types were placed in this encounter.   Caryl Pina, MD [facility] 04/14/2018, 9:42 AM

## 2018-05-10 ENCOUNTER — Telehealth: Payer: Self-pay

## 2018-05-10 NOTE — Telephone Encounter (Signed)
[facility] - Due to dementia [facility] services will not be provided.  Writer consulted with Dr. Modesta Messing and Dr.Dettinger.

## 2018-05-11 ENCOUNTER — Ambulatory Visit: Payer: Medicare HMO | Admitting: Podiatry

## 2018-06-01 ENCOUNTER — Encounter: Payer: Self-pay | Admitting: Family

## 2018-06-01 ENCOUNTER — Ambulatory Visit (INDEPENDENT_AMBULATORY_CARE_PROVIDER_SITE_OTHER): Payer: Medicare HMO | Admitting: Family

## 2018-06-01 VITALS — BP 135/66 | HR 74 | Temp 97.0°F | Ht 65.0 in | Wt 164.0 lb

## 2018-06-01 DIAGNOSIS — L409 Psoriasis, unspecified: Secondary | ICD-10-CM | POA: Diagnosis not present

## 2018-06-01 MED ORDER — CLOBETASOL PROPIONATE 0.05 % EX CREA: 1.0000 "application " | TOPICAL_CREAM | Freq: Two times a day (BID) | CUTANEOUS | 2 refills | Status: DC

## 2018-06-01 NOTE — Progress Notes (Signed)
   Subjective:    Patient ID: Mallory Steele, female    DOB: 02-22-1932, 84 y.o.   MRN: [id number]  Chief Complaint  Patient presents with  . rash with itching, burning over body   Pt presents to the office today with worsening psoriasis. She was seen in the office on 04/14/18 and given rx of kenalog cream that did not help. Daughter states she has never seen a dermatologists.   Rash  This is a chronic problem. The current episode started more than 1 year ago. The affected locations include the back, left elbow, right elbow, right buttock, left buttock, left hip and right hip. The rash is characterized by redness and itchiness. She was exposed to nothing. Past treatments include topical steroids. The treatment provided moderate relief.      Review of Systems  Skin: Positive for rash.  All other systems reviewed and are negative.      Objective:   Physical Exam  Constitutional: She is oriented to person, place, and time. She appears well-developed and well-nourished. No distress.  HENT:  Head: Normocephalic.  Cardiovascular: Normal rate, regular rhythm, normal heart sounds and intact distal pulses.  No murmur heard. Pulmonary/Chest: Effort normal and breath sounds normal. No respiratory distress. She has no wheezes.  Abdominal: Soft. Bowel sounds are normal. She exhibits no distension. There is no tenderness.  Musculoskeletal: Normal range of motion. She exhibits no edema or tenderness.  Neurological: She is alert and oriented to person, place, and time. She has normal reflexes. No cranial nerve deficit.  Skin: Skin is warm and dry. Rash (plaque psoriasis on right buttocks, back, bilateral elbows, and knees) noted.     Psychiatric: She has a normal mood and affect. Her behavior is normal. Judgment and thought content normal.  Vitals reviewed.     BP 135/66   Pulse 74   Temp (!) 97 F (36.1 C) (Oral)   Ht 5\' 5"  (1.651 m)   Wt 164 lb (74.4 kg)   BMI 27.29 kg/m        Assessment & Plan:  Mallory Steele was seen today for rash with itching, burning over body.  Diagnoses and all orders for this visit:  Psoriasis -     clobetasol cream (TEMOVATE) 0.05 %; Apply 1 application topically 2 (two) times daily. -     Ambulatory referral to Dermatology   Will start clobetasol today Referral to derm  Do not scratch Keep moisturized  Keep follow up with PCP  Evelina Dun, FNP

## 2018-06-01 NOTE — Patient Instructions (Signed)
Psoriasis Psoriasis is a long-term (chronic) condition of skin inflammation. It occurs because your immune system causes skin cells to form too quickly. As a result, too many skin cells grow and create raised, red patches (plaques) that look silvery on your skin. Plaques may appear anywhere on your body. They can be any size or shape. Psoriasis can come and go. The condition varies from mild to very severe. It cannot be passed from one person to another (not contagious). What are the causes? The cause of psoriasis is not known, but certain factors can make the condition worse. These include:  Damage or trauma to the skin, such as cuts, scrapes, sunburn, and dryness.  Lack of sunlight.  Certain medicines.  Alcohol.  Tobacco use.  Stress.  Infections caused by bacteria or viruses.  What increases the risk? This condition is more likely to develop in:  People with a family history of psoriasis.  People who are Caucasian.  People who are between the ages of 15-30 and 50-60 years old.  What are the signs or symptoms? There are five different types of psoriasis. You can have more than one type of psoriasis during your life. Types are:  Plaque.  Guttate.  Inverse.  Pustular.  Erythrodermic.  Each type of psoriasis has different symptoms.  Plaque psoriasis symptoms include red, raised plaques with a silvery white coating (scale). These plaques may be itchy. Your nails may be pitted and crumbly or fall off.  Guttate psoriasis symptoms include small red spots that often show up on your trunk, arms, and legs. These spots may develop after you have been sick, especially with strep throat.  Inverse psoriasis symptoms include plaques in your underarm area, under your breasts, or on your genitals, groin, or buttocks.  Pustular psoriasis symptoms include pus-filled bumps that are painful, red, and swollen on the palms of your hands or the soles of your feet. You also may feel  exhausted, feverish, weak, or have no appetite.  Erythrodermic psoriasis symptoms include bright red skin that may look burned. You may have a fast heartbeat and a body temperature that is too high or too low. You may be itchy or in pain.  How is this diagnosed? Your health care provider may suspect psoriasis based on your symptoms and family history. Your health care provider will also do a physical exam. This may include a procedure to remove a tissue sample (biopsy) for testing. You may also be referred to a health care provider who specializes in skin diseases (dermatologist). How is this treated? There is no cure for this condition, but treatment can help manage it. Goals of treatment include:  Helping your skin heal.  Reducing itching and inflammation.  Slowing the growth of new skin cells.  Helping your immune system respond better to your skin.  Treatment varies, depending on the severity of your condition. Treatment may include:  Creams or ointments.  Ultraviolet ray exposure (light therapy). This may include natural sunlight or light therapy in a medical office.  Medicines (systemic therapy). These medicines can help your body better manage skin cell turnover and inflammation. They may be used along with light therapy or ointments. You may also get antibiotic medicines if you have an infection.  Follow these instructions at home: Skin Care  Moisturize your skin as needed. Only use moisturizers that have been approved by your health care provider.  Apply cool compresses to the affected areas.  Do not scratch your skin. Lifestyle   Do not   use tobacco products. This includes cigarettes, chewing tobacco, and e-cigarettes. If you need help quitting, ask your health care provider.  Drink little or no alcohol.  Try techniques for stress reduction, such as meditation or yoga.  Get exposure to the sun as told by your health care provider. Do not get sunburned.  Consider  joining a psoriasis support group. Medicines  Take or use over-the-counter and prescription medicines only as told by your health care provider.  If you were prescribed an antibiotic, take or use it as told by your health care provider. Do not stop taking the antibiotic even if your condition starts to improve. General instructions  Keep a journal to help track what triggers an outbreak. Try to avoid any triggers.  See a counselor or social worker if feelings of sadness, frustration, and hopelessness about your condition are interfering with your work and relationships.  Keep all follow-up visits as told by your health care provider. This is important. Contact a health care provider if:  Your pain gets worse.  You have increasing redness or warmth in the affected areas.  You have new or worsening pain or stiffness in your joints.  Your nails start to break easily or pull away from the nail bed.  You have a fever.  You feel depressed. This information is not intended to replace advice given to you by your health care provider. Make sure you discuss any questions you have with your health care provider. Document Released: 11/05/2000 Document Revised: 04/15/2016 Document Reviewed: 03/26/2015 Elsevier Interactive Patient Education  2018 [employer]vier Inc[employer]  

## 2018-06-05 ENCOUNTER — Ambulatory Visit: Payer: Medicare HMO | Admitting: Family Medicine

## 2018-06-06 ENCOUNTER — Encounter: Payer: Self-pay | Admitting: Podiatry

## 2018-06-06 ENCOUNTER — Ambulatory Visit: Payer: Medicare HMO | Admitting: Podiatry

## 2018-06-06 VITALS — BP 125/67 | HR 72 | Resp 16

## 2018-06-06 DIAGNOSIS — M722 Plantar fascial fibromatosis: Secondary | ICD-10-CM

## 2018-06-06 NOTE — Patient Instructions (Signed)

## 2018-06-07 NOTE — Progress Notes (Signed)
Subjective:  Patient ID: Mallory Steele, female    DOB: [birth year],  MRN: [id number] HPI Chief Complaint  Patient presents with  . Foot Pain    Plantar (whole bottom) bilateral - burning x 1 year, worsens at night, cramping, takes Tylenol but doesn't help  . New Patient (Initial Visit)    84 y.o. female presents with the above complaint.   ROS: Denies fever chills nausea vomiting muscle aches pains calf pain back pain chest pain shortness of breath headache.  Past Medical History:  Diagnosis Date  . Anxiety   . Dementia    Past Surgical History:  Procedure Laterality Date  . APPENDECTOMY  1947  . COLON SURGERY  [phone]  . JOINT REPLACEMENT     knee replaced    Current Outpatient Medications:  .  baclofen (LIORESAL) 10 MG tablet, TAKE 1 TABLET BY MOUTH THREE TIMES A DAY, Disp: 30 tablet, Rfl: 0 .  clobetasol cream (TEMOVATE) 2.63 %, Apply 1 application topically 2 (two) times daily., Disp: 90 g, Rfl: 2 .  donepezil (ARICEPT) 10 MG tablet, TAKE 1 TABLET BY MOUTH EVERY DAY, Disp: 90 tablet, Rfl: 0 .  DULoxetine (CYMBALTA) 60 MG capsule, Take 2 capsules (120 mg total) by mouth daily., Disp: 180 capsule, Rfl: 0 .  meclizine (ANTIVERT) 25 MG tablet, TAKE 1 TABLET (25 MG TOTAL) BY MOUTH 3 (THREE) TIMES DAILY AS NEEDED FOR DIZZINESS., Disp: 30 tablet, Rfl: 0 .  meloxicam (MOBIC) 15 MG tablet, Take 1 tablet (15 mg total) by mouth daily., Disp: 90 tablet, Rfl: 1 .  risperiDONE (RISPERDAL) 0.25 MG tablet, TAKE 1 TABLET (0.25 MG TOTAL) BY MOUTH 2 (TWO) TIMES DAILY AS NEEDED., Disp: 180 tablet, Rfl: 0 .  risperiDONE (RISPERDAL) 0.5 MG tablet, Take 1 tablet (0.5 mg total) by mouth 2 (two) times daily as needed., Disp: 180 tablet, Rfl: 1 .  rOPINIRole (REQUIP) 0.25 MG tablet, TAKE 1 TABLET (0.25 MG TOTAL) BY MOUTH 3 (THREE) TIMES DAILY. FOR LEG CRAMPS, Disp: 270 tablet, Rfl: 0 .  traZODone (DESYREL) 50 MG tablet, TAKE 1 TO 2 TABLETS AT BEDTIME AS NEEDED FOR SLEEP, Disp: 270 tablet, Rfl:  0 .  triamcinolone cream (KENALOG) 0.1 %, Apply 1 application topically 2 (two) times daily., Disp: 454 g, Rfl: 1  No Known Allergies Review of Systems Objective:   Vitals:   06/06/18 1413  BP: 125/67  Pulse: 72  Resp: 16    General: Well developed, nourished, in no acute distress, alert and oriented x3   Dermatological: Skin is warm, dry and supple bilateral. Nails x 10 are well maintained; remaining integument appears unremarkable at this time. There are no open sores, no preulcerative lesions, no rash or signs of infection present.  Vascular: Dorsalis Pedis artery and Posterior Tibial artery pedal pulses are 2/4 bilateral with immedate capillary fill time. Pedal hair growth present. No varicosities and no lower extremity edema present bilateral.   Neruologic: Grossly intact via light touch bilateral. Vibratory intact via tuning fork bilateral. Protective threshold with Semmes Wienstein monofilament intact to all pedal sites bilateral. Patellar and Achilles deep tendon reflexes 2+ bilateral. No Babinski or clonus noted bilateral.   Musculoskeletal: No gross boney pedal deformities bilateral. No pain, crepitus, or limitation noted with foot and ankle range of motion bilateral. Muscular strength 5/5 in all groups tested bilateral.  She has pain on palpation medial aspect of the bilateral heel.  No pain on medial lateral compression of the calcaneus.  Gait: Unassisted, Nonantalgic.  Radiographs:  Radiographs taken today demonstrate soft tissue increase in density plantar calcaneal insertion sites bilateral.  No acute findings.    Assessment & Plan:   Assessment: Plantar fasciitis bilateral.  Plan: Discussed etiology pathology conservative versus surgical therapies after sterile Betadine skin prep bilateral heels I injected 20 mg Kenalog 5 mg Marcaine to each heel medially.  Tolerated procedure well without complications.  Discussed appropriate shoe gear stretching exercise ice  therapy sugar modifications.  Tolerated procedure well and I will follow-up with her in 1 month.     Max T. [place], [place]

## 2018-06-08 ENCOUNTER — Ambulatory Visit: Payer: Medicare HMO | Admitting: Family Medicine

## 2018-06-14 ENCOUNTER — Other Ambulatory Visit: Payer: Self-pay | Admitting: Family Medicine

## 2018-06-15 NOTE — Telephone Encounter (Signed)
Last seen 06/06/18  Dr Dettinger

## 2018-06-19 ENCOUNTER — Other Ambulatory Visit: Payer: Self-pay | Admitting: Family Medicine

## 2018-06-19 DIAGNOSIS — F331 Major depressive disorder, recurrent, moderate: Secondary | ICD-10-CM

## 2018-06-19 DIAGNOSIS — R252 Cramp and spasm: Secondary | ICD-10-CM

## 2018-06-23 ENCOUNTER — Telehealth: Payer: Self-pay | Admitting: Family Medicine

## 2018-06-25 ENCOUNTER — Other Ambulatory Visit: Payer: Self-pay | Admitting: Family Medicine

## 2018-06-25 DIAGNOSIS — F331 Major depressive disorder, recurrent, moderate: Secondary | ICD-10-CM

## 2018-06-26 ENCOUNTER — Other Ambulatory Visit: Payer: Self-pay | Admitting: Family Medicine

## 2018-06-26 DIAGNOSIS — F0391 Unspecified dementia with behavioral disturbance: Secondary | ICD-10-CM

## 2018-06-26 DIAGNOSIS — F03918 Unspecified dementia, unspecified severity, with other behavioral disturbance: Secondary | ICD-10-CM

## 2018-06-26 NOTE — Telephone Encounter (Signed)
Last seen 06/01/18 

## 2018-06-28 ENCOUNTER — Other Ambulatory Visit: Payer: Self-pay | Admitting: Family Medicine

## 2018-06-28 DIAGNOSIS — L299 Pruritus, unspecified: Secondary | ICD-10-CM | POA: Diagnosis not present

## 2018-06-28 DIAGNOSIS — L409 Psoriasis, unspecified: Secondary | ICD-10-CM | POA: Diagnosis not present

## 2018-06-28 DIAGNOSIS — H8113 Benign paroxysmal vertigo, bilateral: Secondary | ICD-10-CM

## 2018-07-04 ENCOUNTER — Ambulatory Visit: Payer: Medicare HMO | Admitting: Podiatry

## 2018-07-13 ENCOUNTER — Ambulatory Visit: Payer: Medicare HMO | Admitting: Podiatry

## 2018-07-14 ENCOUNTER — Other Ambulatory Visit: Payer: Self-pay | Admitting: Family Medicine

## 2018-07-31 ENCOUNTER — Encounter: Payer: Self-pay | Admitting: Family Medicine

## 2018-07-31 DIAGNOSIS — L409 Psoriasis, unspecified: Secondary | ICD-10-CM | POA: Diagnosis not present

## 2018-07-31 DIAGNOSIS — Z79899 Other long term (current) drug therapy: Secondary | ICD-10-CM | POA: Diagnosis not present

## 2018-08-03 ENCOUNTER — Ambulatory Visit (INDEPENDENT_AMBULATORY_CARE_PROVIDER_SITE_OTHER): Payer: Medicare HMO | Admitting: Family Medicine

## 2018-08-03 ENCOUNTER — Telehealth: Payer: Self-pay | Admitting: Family Medicine

## 2018-08-03 ENCOUNTER — Encounter: Payer: Self-pay | Admitting: Family Medicine

## 2018-08-03 VITALS — BP 132/73 | HR 87 | Temp 97.9°F | Ht 65.0 in | Wt 168.0 lb

## 2018-08-03 DIAGNOSIS — D509 Iron deficiency anemia, unspecified: Secondary | ICD-10-CM | POA: Diagnosis not present

## 2018-08-03 DIAGNOSIS — N179 Acute kidney failure, unspecified: Secondary | ICD-10-CM | POA: Diagnosis not present

## 2018-08-03 MED ORDER — FERROUS SULFATE 325 (65 FE) MG PO TABS: 325.0000 mg | ORAL_TABLET | Freq: Every day | ORAL | 1 refills | Status: DC

## 2018-08-03 NOTE — Telephone Encounter (Signed)
Patient returning call.

## 2018-08-03 NOTE — Telephone Encounter (Signed)
Review labs when you receive them.

## 2018-08-03 NOTE — Telephone Encounter (Signed)
I did see them and it would be best for her to come in for a visit at some point to discuss the possible anemia and renal function, sooner would be better.  It is not urgent but I would like to see her within a couple weeks

## 2018-08-03 NOTE — Telephone Encounter (Signed)
Patient aware appt made today at 2:55

## 2018-08-03 NOTE — Progress Notes (Signed)
BP 132/73   Pulse 87   Temp 97.9 F (36.6 C) (Oral)   Ht _0  (1.651 m)   Wt 168 lb (76.2 kg)   BMI 27.96 kg/m    Subjective:    Patient ID: Mallory Steele, female    DOB: 1932/07/25, 84 y.o.   MRN: [id number]  HPI: Mallory Steele is a 84 y.o. female presenting on 08/03/2018 for discuss labs from Dr. Modena Nunnery   HPI Lab follow-up from Dr. Tarri Glenn her dermatologist Patient is coming in for lab follow-up from her dermatologist Dr. Tarri Glenn.  He did labs prior to starting her on methotrexate for his psoriasis and psoriatic arthritis.  On the labs it was found that she was anemic with a hemoglobin of 10.1 and that her kidney function had become elevated with a creatinine of 1.51.  Her previous creatinine was 1.06 late last year.  She has not had any decreased urine output or flank pain or abdominal pain associated with this.  She denies any blood in her stool or blood in her urine.  She has felt somewhat fatigued recently but other than that she is been relatively asymptomatic and been feeling great compared to what she normally feels.  Daughter is here with her today because she does have dementia and so we have discussed this all with her daughter  Relevant past medical, surgical, family and social history reviewed and updated as indicated. Interim medical history since our last visit reviewed. Allergies and medications reviewed and updated.  Review of Systems  Constitutional: Positive for fatigue. Negative for chills and fever.  Eyes: Negative for visual disturbance.  Respiratory: Negative for chest tightness and shortness of breath.   Cardiovascular: Negative for chest pain and leg swelling.  Gastrointestinal: Negative for abdominal pain and blood in stool.  Genitourinary: Negative for difficulty urinating and hematuria.  Musculoskeletal: Negative for back pain and gait problem.  Skin: Positive for rash (Psoriatic arthritis and psoriasis).  Neurological: Negative for light-headedness  and headaches.  Psychiatric/Behavioral: Negative for agitation and behavioral problems.  All other systems reviewed and are negative.   Per HPI unless specifically indicated above   Allergies as of 08/03/2018   No Known Allergies     Medication List        Accurate as of 08/03/18  3:46 PM. Always use your most recent med list.          baclofen 10 MG tablet Commonly known as:  LIORESAL TAKE 1 TABLET BY MOUTH THREE TIMES A DAY   clobetasol cream 0.05 % Commonly known as:  TEMOVATE Apply 1 application topically 2 (two) times daily.   donepezil 10 MG tablet Commonly known as:  ARICEPT TAKE 1 TABLET BY MOUTH EVERY DAY   DULoxetine 60 MG capsule Commonly known as:  CYMBALTA TAKE 2 CAPSULES (120 MG TOTAL) BY MOUTH DAILY.   FOLIC ACID PO Take by mouth.   meclizine 25 MG tablet Commonly known as:  ANTIVERT TAKE 1 TABLET (25 MG TOTAL) BY MOUTH 3 (THREE) TIMES DAILY AS NEEDED FOR DIZZINESS.   meloxicam 15 MG tablet Commonly known as:  MOBIC TAKE 1 TABLET BY MOUTH EVERY DAY   METHOTREXATE PO Take by mouth.   risperiDONE 0.25 MG tablet Commonly known as:  RISPERDAL TAKE 1 TABLET (0.25 MG TOTAL) BY MOUTH 2 (TWO) TIMES DAILY AS NEEDED.   risperiDONE 0.5 MG tablet Commonly known as:  RISPERDAL TAKE 1 TABLET (0.5 MG TOTAL) BY MOUTH 2 (TWO) TIMES DAILY AS NEEDED.  rOPINIRole 0.25 MG tablet Commonly known as:  REQUIP TAKE 1 TABLET (0.25 MG TOTAL) BY MOUTH 3 (THREE) TIMES DAILY. FOR LEG CRAMPS   traZODone 50 MG tablet Commonly known as:  DESYREL TAKE 1 TO 2 TABLETS AT BEDTIME AS NEEDED FOR SLEEP   triamcinolone cream 0.1 % Commonly known as:  KENALOG Apply 1 application topically 2 (two) times daily.          Objective:    BP 132/73   Pulse 87   Temp 97.9 F (36.6 C) (Oral)   Ht _0  (1.651 m)   Wt 168 lb (76.2 kg)   BMI 27.96 kg/m   Wt Readings from Last 3 Encounters:  08/03/18 168 lb (76.2 kg)  06/01/18 164 lb (74.4 kg)  04/14/18 166 lb (75.3 kg)      Physical Exam  Constitutional: She is oriented to person, place, and time. She appears well-developed and well-nourished. No distress.  Eyes: Conjunctivae are normal.  Cardiovascular: Normal rate, regular rhythm, normal heart sounds and intact distal pulses.  No murmur heard. Pulmonary/Chest: Effort normal and breath sounds normal. No respiratory distress. She has no wheezes.  Abdominal: Soft. Bowel sounds are normal. She exhibits no distension. There is no tenderness. There is no guarding and no CVA tenderness.  Musculoskeletal: Normal range of motion. She exhibits no edema or tenderness.  Neurological: She is alert and oriented to person, place, and time. Coordination normal.  Skin: Skin is warm and dry. No rash noted. She is not diaphoretic.  Psychiatric: She has a normal mood and affect. Her behavior is normal.  Nursing note and vitals reviewed.       Assessment & Plan:   Problem List Items Addressed This Visit      Other   Iron deficiency anemia - Primary   Relevant Medications   FOLIC ACID PO   ferrous sulfate 325 (65 FE) MG tablet   Other Relevant Orders   Anemia Profile B    Other Visit Diagnoses    Acute kidney injury ([place])       Relevant Orders   CMP14+EGFR      Started patient on iron which she has had previously and also will recheck kidney function today and recommended come back in 2 weeks for recheck if abnormal. Follow up plan: Return in about 2 weeks (around 08/17/2018), or if symptoms worsen or fail to improve, for Recheck renal function.  Counseling provided for all of the vaccine components Orders Placed This Encounter  Procedures  . CMP14+EGFR  . Anemia Profile B    Caryl Pina, MD [facility] 08/03/2018, 3:46 PM

## 2018-08-04 LAB — CMP14+EGFR
ALT: 15 IU/L (ref 0–32)
AST: 23 IU/L (ref 0–40)
Albumin/Globulin Ratio: 1.6 (ref 1.2–2.2)
Albumin: 4.2 g/dL (ref 3.5–4.7)
Alkaline Phosphatase: 108 IU/L (ref 39–117)
BUN/Creatinine Ratio: 14 (ref 12–28)
BUN: 18 mg/dL (ref 8–27)
Bilirubin Total: 0.2 mg/dL (ref 0.0–1.2)
CALCIUM: 8.7 mg/dL (ref 8.7–10.3)
CHLORIDE: 101 mmol/L (ref 96–106)
CO2: 19 mmol/L — ABNORMAL LOW (ref 20–29)
Creatinine, Ser: 1.26 mg/dL — ABNORMAL HIGH (ref 0.57–1.00)
GFR calc Af Amer: 45 mL/min/{1.73_m2} — ABNORMAL LOW (ref >59)
GFR, EST NON AFRICAN AMERICAN: 39 mL/min/{1.73_m2} — AB (ref >59)
GLUCOSE: 91 mg/dL (ref 65–99)
Globulin, Total: 2.6 g/dL (ref 1.5–4.5)
Potassium: 4 mmol/L (ref 3.5–5.2)
Sodium: 139 mmol/L (ref 134–144)
Total Protein: 6.8 g/dL (ref 6.0–8.5)

## 2018-08-04 LAB — ANEMIA PROFILE B
BASOS: 0 %
Basophils Absolute: 0 10*3/uL (ref 0.0–0.2)
EOS (ABSOLUTE): 0.1 10*3/uL (ref 0.0–0.4)
EOS: 1 %
Ferritin: 10 ng/mL — ABNORMAL LOW (ref 15–150)
Folate: >20 ng/mL (ref >3.0)
HEMATOCRIT: 32.7 % — AB (ref 34.0–46.6)
HEMOGLOBIN: 10 g/dL — AB (ref 11.1–15.9)
IMMATURE GRANS (ABS): 0 10*3/uL (ref 0.0–0.1)
IMMATURE GRANULOCYTES: 0 %
IRON SATURATION: 11 % — AB (ref 15–55)
IRON: 51 ug/dL (ref 27–139)
Lymphocytes Absolute: 2.6 10*3/uL (ref 0.7–3.1)
Lymphs: 38 %
MCH: 24.2 pg — ABNORMAL LOW (ref 26.6–33.0)
MCHC: 30.6 g/dL — ABNORMAL LOW (ref 31.5–35.7)
MCV: 79 fL (ref 79–97)
MONOCYTES: 8 %
Monocytes Absolute: 0.6 10*3/uL (ref 0.1–0.9)
NEUTROS ABS: 3.6 10*3/uL (ref 1.4–7.0)
Neutrophils: 53 %
Platelets: 237 10*3/uL (ref 150–450)
RBC: 4.13 x10E6/uL (ref 3.77–5.28)
RDW: 18.2 % — AB (ref 12.3–15.4)
Retic Ct Pct: 1.5 % (ref 0.6–2.6)
Total Iron Binding Capacity: 478 ug/dL — ABNORMAL HIGH (ref 250–450)
UIBC: 427 ug/dL — ABNORMAL HIGH (ref 118–369)
Vitamin B-12: 184 pg/mL — ABNORMAL LOW (ref 232–1245)
WBC: 6.9 10*3/uL (ref 3.4–10.8)

## 2018-08-09 ENCOUNTER — Telehealth: Payer: Self-pay | Admitting: Family Medicine

## 2018-08-09 MED ORDER — CYANOCOBALAMIN 250 MCG PO TABS: 250.0000 ug | ORAL_TABLET | Freq: Every day | ORAL | 1 refills | Status: DC

## 2018-08-09 NOTE — Telephone Encounter (Signed)
Patient was mildly anemic and has both low B12 and iron, however can continue to take the iron and I sent in a prescription for B12

## 2018-08-09 NOTE — Telephone Encounter (Signed)
Patient aware and verbalizes understanding. 

## 2018-08-15 ENCOUNTER — Other Ambulatory Visit: Payer: Self-pay | Admitting: Family Medicine

## 2018-08-17 NOTE — Telephone Encounter (Signed)
Dettinger. NTBS 30 days given 07/17/18

## 2018-08-17 NOTE — Telephone Encounter (Signed)
[id number]-cb 9/26

## 2018-08-21 ENCOUNTER — Ambulatory Visit: Payer: Medicare HMO | Admitting: Family Medicine

## 2018-08-21 ENCOUNTER — Other Ambulatory Visit: Payer: Medicare HMO

## 2018-08-21 DIAGNOSIS — R7989 Other specified abnormal findings of blood chemistry: Secondary | ICD-10-CM | POA: Diagnosis not present

## 2018-08-21 NOTE — Addendum Note (Signed)
Addended by: Liliane Bade on: 08/21/2018 01:45 PM   Modules accepted: Orders

## 2018-08-22 LAB — CMP14+EGFR
ALT: 13 IU/L (ref 0–32)
AST: 16 IU/L (ref 0–40)
Albumin/Globulin Ratio: 1.6 (ref 1.2–2.2)
Albumin: 4.1 g/dL (ref 3.5–4.7)
Alkaline Phosphatase: 93 IU/L (ref 39–117)
BILIRUBIN TOTAL: 0.2 mg/dL (ref 0.0–1.2)
BUN/Creatinine Ratio: 12 (ref 12–28)
BUN: 16 mg/dL (ref 8–27)
CALCIUM: 9 mg/dL (ref 8.7–10.3)
CHLORIDE: 101 mmol/L (ref 96–106)
CO2: 24 mmol/L (ref 20–29)
Creatinine, Ser: 1.32 mg/dL — ABNORMAL HIGH (ref 0.57–1.00)
GFR calc Af Amer: 43 mL/min/{1.73_m2} — ABNORMAL LOW (ref >59)
GFR, EST NON AFRICAN AMERICAN: 37 mL/min/{1.73_m2} — AB (ref >59)
GLUCOSE: 157 mg/dL — AB (ref 65–99)
Globulin, Total: 2.5 g/dL (ref 1.5–4.5)
POTASSIUM: 4.5 mmol/L (ref 3.5–5.2)
Sodium: 139 mmol/L (ref 134–144)
Total Protein: 6.6 g/dL (ref 6.0–8.5)

## 2018-08-28 DIAGNOSIS — Z79899 Other long term (current) drug therapy: Secondary | ICD-10-CM | POA: Diagnosis not present

## 2018-08-28 DIAGNOSIS — L409 Psoriasis, unspecified: Secondary | ICD-10-CM | POA: Diagnosis not present

## 2018-09-01 ENCOUNTER — Telehealth: Payer: Self-pay | Admitting: Family Medicine

## 2018-09-01 NOTE — Telephone Encounter (Signed)
Please watch for new lab results for your review ,(faxed).  Review and advise of any medication changes or other suggestions.

## 2018-09-01 NOTE — Telephone Encounter (Signed)
Patient aware of recommendations.  

## 2018-09-01 NOTE — Telephone Encounter (Signed)
I have the labs return to me and looking at the kidney function that is stable with where she has been and except for not taking the meloxicam too frequently which I only gave her a short supply of there are no other medications that I would necessarily change for this, I would recommend that she stay well-hydrated with plenty of water and continue to take the iron because of the anemia but of them that these numbers are actually slightly better than what she normally runs so I would not adjust anything based off of these.

## 2018-09-18 ENCOUNTER — Ambulatory Visit (INDEPENDENT_AMBULATORY_CARE_PROVIDER_SITE_OTHER): Payer: Medicare HMO | Admitting: Family Medicine

## 2018-09-18 ENCOUNTER — Encounter: Payer: Self-pay | Admitting: Family Medicine

## 2018-09-18 VITALS — BP 127/81 | HR 76 | Temp 98.0°F | Ht 65.0 in | Wt 170.0 lb

## 2018-09-18 DIAGNOSIS — N183 Chronic kidney disease, stage 3 unspecified: Secondary | ICD-10-CM

## 2018-09-18 DIAGNOSIS — Z23 Encounter for immunization: Secondary | ICD-10-CM | POA: Diagnosis not present

## 2018-09-18 NOTE — Progress Notes (Signed)
BP 127/81   Pulse 76   Temp 98 F (36.7 C) (Oral)   Ht _0  (1.651 m)   Wt 169 lb 15.7 oz (77.1 kg)   BMI 28.29 kg/m    Subjective:    Patient ID: Mallory Steele, female    DOB: December 02, 1931, 84 y.o.   MRN: [id number]  HPI: Mallory Steele is a 84 y.o. female presenting on 09/18/2018 for Medical Management of Chronic Issues   HPI Acute on chronic kidney damage recheck Patient is coming in today with her daughter who brings her because she has severe dementia and most of the history is provided by the daughter.  They are coming in today to recheck her kidneys because of the more recent elevated kidney function.  We did a recheck 2 weeks later and it was about the same and was unchanged.  She has been trying to make sure that her mother been drinking more water which has been a challenge previously.  We will recheck the renal function today.  They deny her having any difficulties with urination.  Relevant past medical, surgical, family and social history reviewed and updated as indicated. Interim medical history since our last visit reviewed. Allergies and medications reviewed and updated.  Review of Systems  Constitutional: Negative for chills and fever.  Eyes: Negative for visual disturbance.  Respiratory: Negative for chest tightness and shortness of breath.   Cardiovascular: Negative for chest pain and leg swelling.  Musculoskeletal: Negative for back pain and gait problem.  Skin: Negative for rash.  Neurological: Negative for light-headedness and headaches.  Psychiatric/Behavioral: Negative for agitation and behavioral problems.  All other systems reviewed and are negative.  Per HPI unless specifically indicated above   Allergies as of 09/18/2018   No Known Allergies     Medication List        Accurate as of 09/18/18  1:52 PM. Always use your most recent med list.          baclofen 10 MG tablet Commonly known as:  LIORESAL TAKE 1 TABLET BY MOUTH THREE TIMES A  DAY   clobetasol cream 0.05 % Commonly known as:  TEMOVATE Apply 1 application topically 2 (two) times daily.   donepezil 10 MG tablet Commonly known as:  ARICEPT TAKE 1 TABLET BY MOUTH EVERY DAY   DULoxetine 60 MG capsule Commonly known as:  CYMBALTA TAKE 2 CAPSULES (120 MG TOTAL) BY MOUTH DAILY.   ferrous sulfate 325 (65 FE) MG tablet Take 1 tablet (325 mg total) by mouth daily with breakfast.   FOLIC ACID PO Take by mouth.   meclizine 25 MG tablet Commonly known as:  ANTIVERT TAKE 1 TABLET (25 MG TOTAL) BY MOUTH 3 (THREE) TIMES DAILY AS NEEDED FOR DIZZINESS.   meloxicam 15 MG tablet Commonly known as:  MOBIC TAKE 1 TABLET BY MOUTH EVERY DAY   methotrexate 2.5 MG tablet Commonly known as:  RHEUMATREX   METHOTREXATE PO Take by mouth.   risperiDONE 0.5 MG tablet Commonly known as:  RISPERDAL TAKE 1 TABLET (0.5 MG TOTAL) BY MOUTH 2 (TWO) TIMES DAILY AS NEEDED.   rOPINIRole 0.25 MG tablet Commonly known as:  REQUIP TAKE 1 TABLET (0.25 MG TOTAL) BY MOUTH 3 (THREE) TIMES DAILY. FOR LEG CRAMPS   traZODone 50 MG tablet Commonly known as:  DESYREL TAKE 1 TO 2 TABLETS AT BEDTIME AS NEEDED FOR SLEEP   triamcinolone cream 0.1 % Commonly known as:  KENALOG Apply 1 application topically 2 (two)  times daily.   vitamin B-12 250 MCG tablet Commonly known as:  CYANOCOBALAMIN Take 1 tablet (250 mcg total) by mouth daily.          Objective:    BP 127/81   Pulse 76   Temp 98 F (36.7 C) (Oral)   Ht _0  (1.651 m)   Wt 169 lb 15.7 oz (77.1 kg)   BMI 28.29 kg/m   Wt Readings from Last 3 Encounters:  09/18/18 169 lb 15.7 oz (77.1 kg)  08/03/18 168 lb (76.2 kg)  06/01/18 164 lb (74.4 kg)    Physical Exam  Constitutional: She is oriented to person, place, and time. She appears well-developed and well-nourished. No distress.  Eyes: Conjunctivae are normal.  Cardiovascular: Normal rate, regular rhythm, normal heart sounds and intact distal pulses.  No murmur  heard. Pulmonary/Chest: Effort normal and breath sounds normal. No respiratory distress. She has no wheezes.  Neurological: She is alert and oriented to person, place, and time. Coordination normal.  Skin: Skin is warm and dry. No rash noted. She is not diaphoretic.  Psychiatric: She has a normal mood and affect. Her behavior is normal.  Nursing note and vitals reviewed.   Results for orders placed or performed in visit on 08/21/18  CMP14+EGFR  Result Value Ref Range   Glucose 157 (H) 65 - 99 mg/dL   BUN 16 8 - 27 mg/dL   Creatinine, Ser 1.32 (H) 0.57 - 1.00 mg/dL   GFR calc non Af Amer 37 (L) >59 mL/min/1.73   GFR calc Af Amer 43 (L) >59 mL/min/1.73   BUN/Creatinine Ratio 12 12 - 28   Sodium 139 134 - 144 mmol/L   Potassium 4.5 3.5 - 5.2 mmol/L   Chloride 101 96 - 106 mmol/L   CO2 24 20 - 29 mmol/L   Calcium 9.0 8.7 - 10.3 mg/dL   Total Protein 6.6 6.0 - 8.5 g/dL   Albumin 4.1 3.5 - 4.7 g/dL   Globulin, Total 2.5 1.5 - 4.5 g/dL   Albumin/Globulin Ratio 1.6 1.2 - 2.2   Bilirubin Total 0.2 0.0 - 1.2 mg/dL   Alkaline Phosphatase 93 39 - 117 IU/L   AST 16 0 - 40 IU/L   ALT 13 0 - 32 IU/L      Assessment & Plan:   Problem List Items Addressed This Visit    None    Visit Diagnoses    Chronic kidney disease (CKD), stage III (moderate) (HCC)    -  Primary   Relevant Orders   BMP8+EGFR   Encounter for immunization       Relevant Orders   Flu vaccine HIGH DOSE PF (Completed)      Patient's kidney function is been stable and she has had a stage III with a creatinine of 1.3, we will recheck this for the next visit. Follow up plan: Return in about 6 months (around 03/20/2019), or if symptoms worsen or fail to improve, for Recheck chronic renal disease.  Counseling provided for all of the vaccine components Orders Placed This Encounter  Procedures  . BMP8+EGFR    Caryl Pina, MD [facility] 09/18/2018, 1:52 PM

## 2018-09-19 LAB — BMP8+EGFR
BUN/Creatinine Ratio: 12 (ref 12–28)
BUN: 13 mg/dL (ref 8–27)
CALCIUM: 9 mg/dL (ref 8.7–10.3)
CO2: 22 mmol/L (ref 20–29)
CREATININE: 1.08 mg/dL — AB (ref 0.57–1.00)
Chloride: 102 mmol/L (ref 96–106)
GFR calc Af Amer: 54 mL/min/{1.73_m2} — ABNORMAL LOW (ref >59)
GFR, EST NON AFRICAN AMERICAN: 47 mL/min/{1.73_m2} — AB (ref >59)
Glucose: 86 mg/dL (ref 65–99)
Potassium: 4.6 mmol/L (ref 3.5–5.2)
SODIUM: 141 mmol/L (ref 134–144)

## 2018-09-21 ENCOUNTER — Other Ambulatory Visit: Payer: Self-pay | Admitting: Family Medicine

## 2018-09-21 DIAGNOSIS — R252 Cramp and spasm: Secondary | ICD-10-CM

## 2018-09-21 DIAGNOSIS — F331 Major depressive disorder, recurrent, moderate: Secondary | ICD-10-CM

## 2018-10-27 IMAGING — CR DG SHOULDER 2+V*R*
3 series · 3 of 3 positions shown · non-contrast
Comparison: None in PACs

CLINICAL DATA: Status post fall today with severe right shoulder
pain and deformity.

EXAM:
RIGHT SHOULDER - 2+ VIEW

[x shoulder axillary right (1 of 3)]
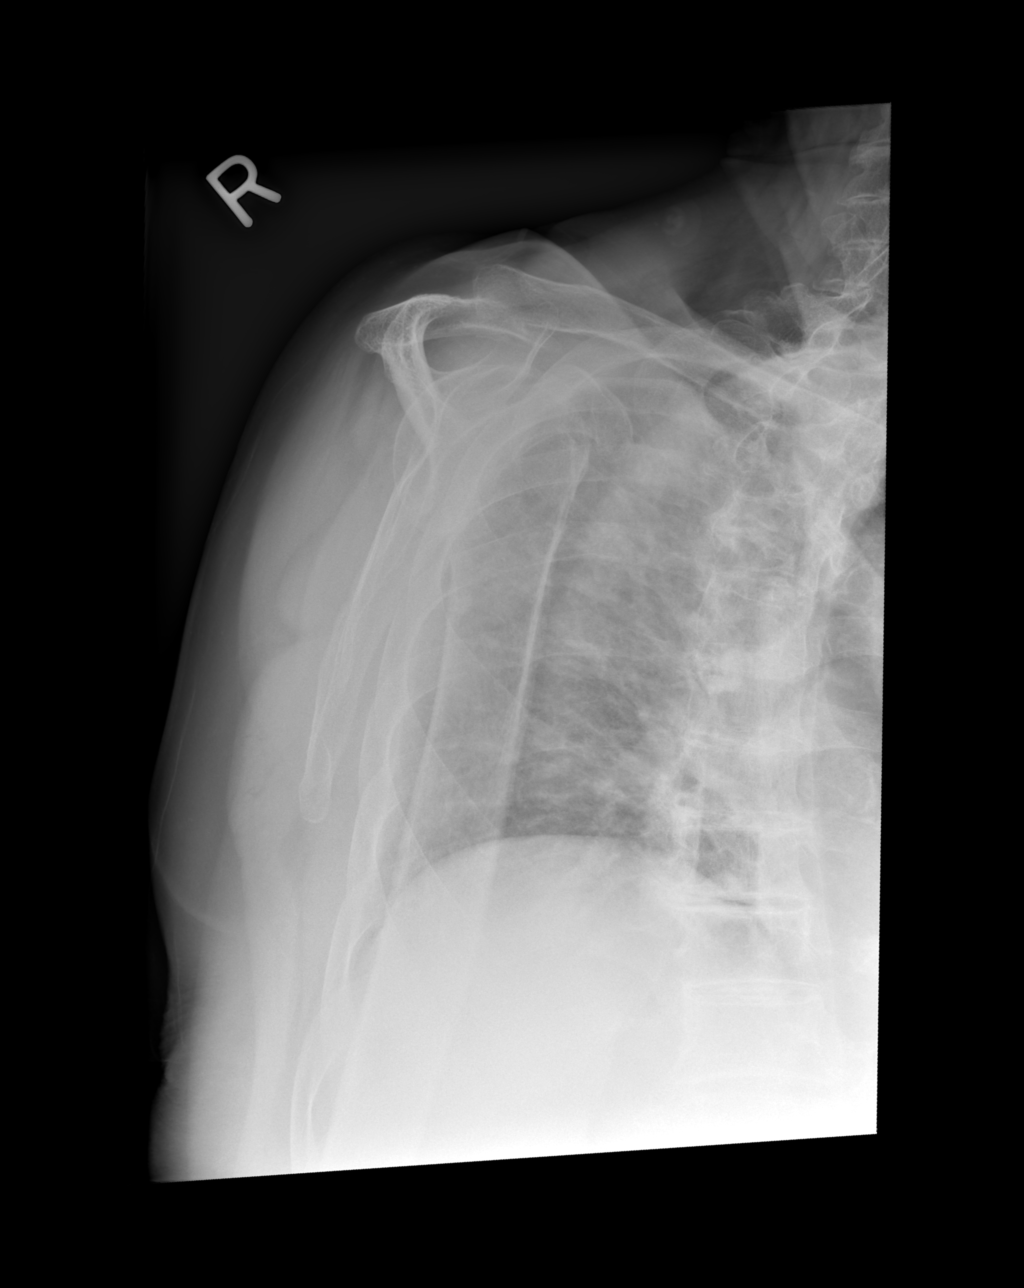

[x shoulder axillary right (2 of 3)]
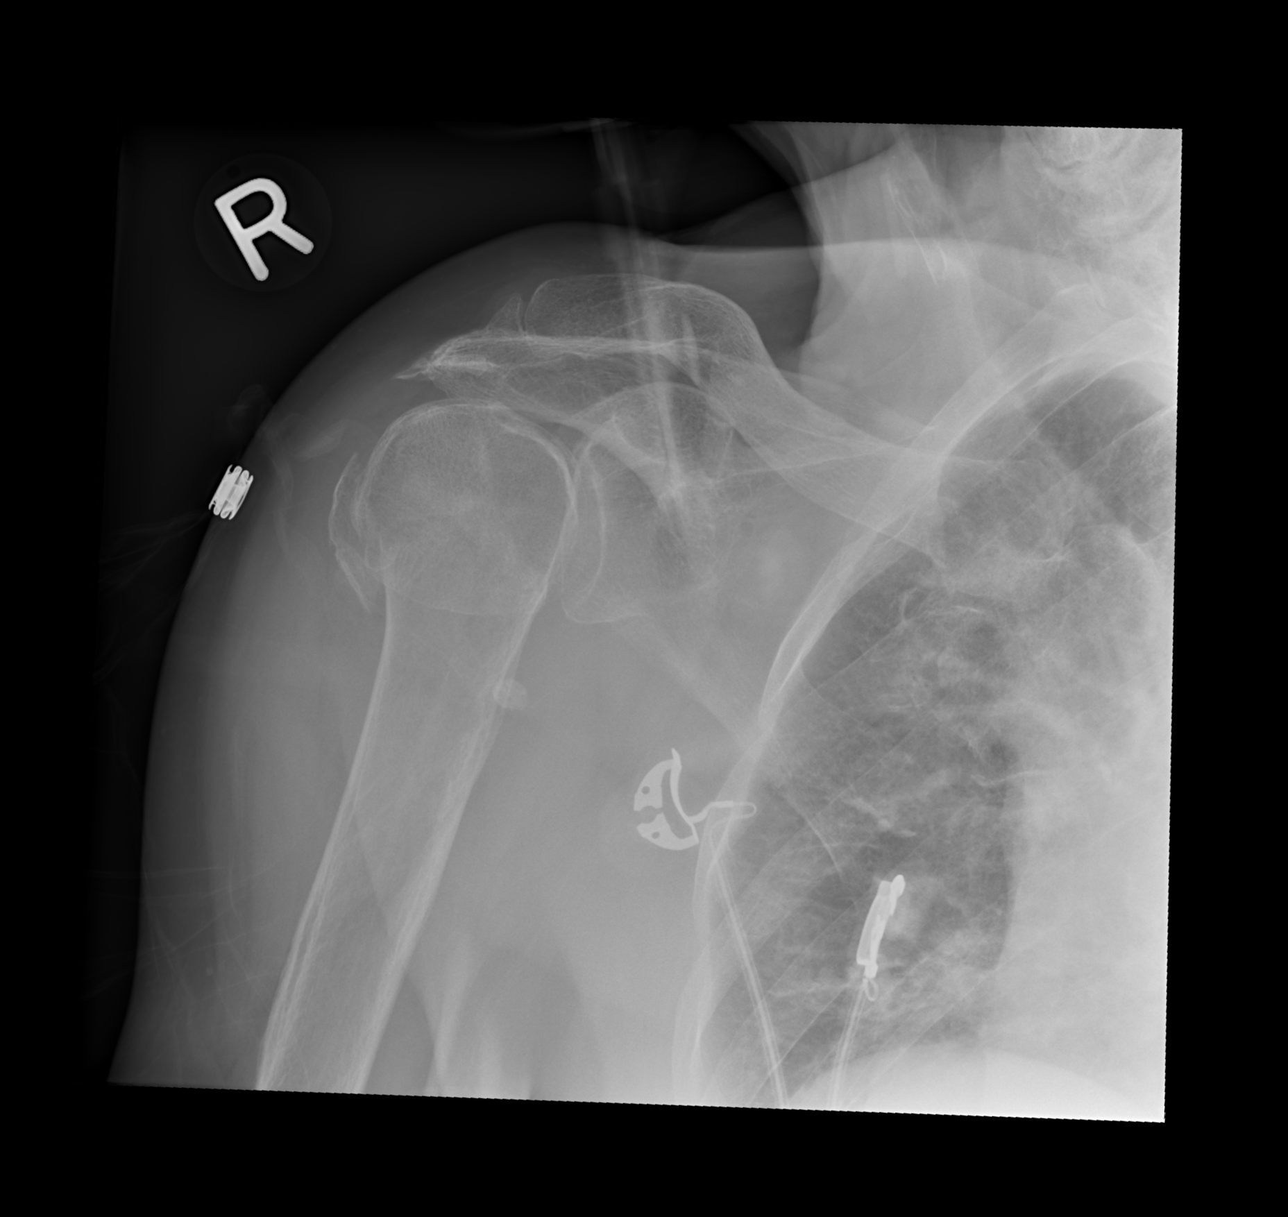

[x shoulder axillary right (3 of 3)]
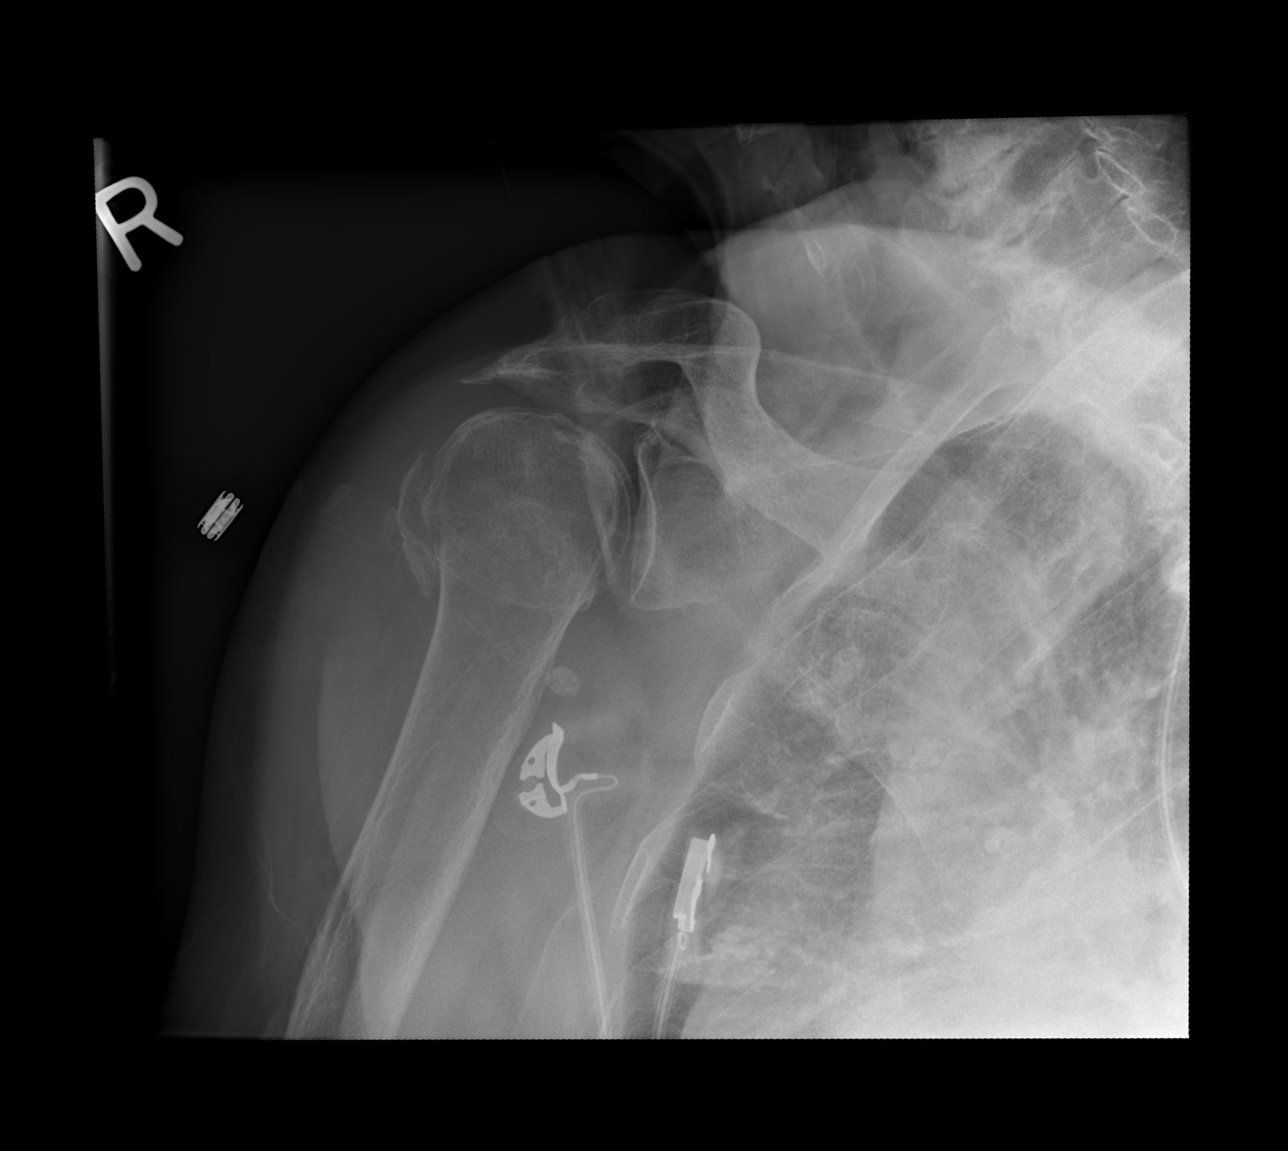

[3 of 3 positions shown; findings below may reference images not displayed]

FINDINGS: The patient has sustained an impacted fracture of the humeral neck.
There is avulsion of the greater tuberosity. The glenohumeral joint
space is mildly narrowed. The AC joint exhibits narrowing. There is
some narrowing of the subacromial subdeltoid space. The observed
portions of the right clavicle and upper right ribs are normal.
IMPRESSION: There is an impacted fracture of the neck of the right humerus.
There is avulsion of the greater tuberosity. There is no
dislocation. There are pre-existing degenerative changes of the
glenohumeral joint and AC joint.

## 2018-12-05 DIAGNOSIS — L409 Psoriasis, unspecified: Secondary | ICD-10-CM | POA: Diagnosis not present

## 2018-12-05 DIAGNOSIS — Z79899 Other long term (current) drug therapy: Secondary | ICD-10-CM | POA: Diagnosis not present

## 2018-12-12 DIAGNOSIS — I10 Essential (primary) hypertension: Secondary | ICD-10-CM | POA: Diagnosis not present

## 2018-12-12 DIAGNOSIS — R110 Nausea: Secondary | ICD-10-CM | POA: Diagnosis not present

## 2018-12-12 DIAGNOSIS — Z79899 Other long term (current) drug therapy: Secondary | ICD-10-CM | POA: Diagnosis not present

## 2018-12-12 DIAGNOSIS — Z87891 Personal history of nicotine dependence: Secondary | ICD-10-CM | POA: Diagnosis not present

## 2018-12-12 DIAGNOSIS — R51 Headache: Secondary | ICD-10-CM | POA: Diagnosis not present

## 2018-12-12 DIAGNOSIS — R0902 Hypoxemia: Secondary | ICD-10-CM | POA: Diagnosis not present

## 2018-12-12 DIAGNOSIS — R0689 Other abnormalities of breathing: Secondary | ICD-10-CM | POA: Diagnosis not present

## 2018-12-12 DIAGNOSIS — F419 Anxiety disorder, unspecified: Secondary | ICD-10-CM | POA: Diagnosis not present

## 2018-12-12 DIAGNOSIS — F329 Major depressive disorder, single episode, unspecified: Secondary | ICD-10-CM | POA: Diagnosis not present

## 2018-12-12 DIAGNOSIS — M542 Cervicalgia: Secondary | ICD-10-CM | POA: Diagnosis not present

## 2018-12-12 DIAGNOSIS — R200 Anesthesia of skin: Secondary | ICD-10-CM | POA: Diagnosis not present

## 2018-12-12 DIAGNOSIS — F0390 Unspecified dementia without behavioral disturbance: Secondary | ICD-10-CM | POA: Diagnosis not present

## 2018-12-15 ENCOUNTER — Ambulatory Visit: Payer: Medicare HMO | Admitting: Family Medicine

## 2018-12-18 ENCOUNTER — Other Ambulatory Visit: Payer: Self-pay | Admitting: Family Medicine

## 2018-12-18 DIAGNOSIS — F331 Major depressive disorder, recurrent, moderate: Secondary | ICD-10-CM

## 2018-12-19 ENCOUNTER — Other Ambulatory Visit: Payer: Self-pay | Admitting: Family Medicine

## 2018-12-19 ENCOUNTER — Other Ambulatory Visit: Payer: Self-pay | Admitting: Family

## 2018-12-19 DIAGNOSIS — F03918 Unspecified dementia, unspecified severity, with other behavioral disturbance: Secondary | ICD-10-CM

## 2018-12-19 DIAGNOSIS — F0391 Unspecified dementia with behavioral disturbance: Secondary | ICD-10-CM

## 2018-12-19 NOTE — Telephone Encounter (Signed)
LAST SEEN 09/18/18

## 2018-12-22 ENCOUNTER — Encounter: Payer: Self-pay | Admitting: Family Medicine

## 2018-12-22 ENCOUNTER — Ambulatory Visit (INDEPENDENT_AMBULATORY_CARE_PROVIDER_SITE_OTHER): Payer: Medicare HMO | Admitting: Family Medicine

## 2018-12-22 VITALS — BP 126/74 | HR 76 | Temp 98.0°F | Ht 65.0 in | Wt 170.0 lb

## 2018-12-22 DIAGNOSIS — R519 Headache, unspecified: Secondary | ICD-10-CM

## 2018-12-22 DIAGNOSIS — Z09 Encounter for follow-up examination after completed treatment for conditions other than malignant neoplasm: Secondary | ICD-10-CM

## 2018-12-22 DIAGNOSIS — R51 Headache: Secondary | ICD-10-CM | POA: Diagnosis not present

## 2018-12-22 DIAGNOSIS — E871 Hypo-osmolality and hyponatremia: Secondary | ICD-10-CM | POA: Diagnosis not present

## 2018-12-22 NOTE — Progress Notes (Signed)
Subjective:    Patient ID: Mallory Steele, female    DOB: 1932-07-30, 85 y.o.   MRN: [id number]  Chief Complaint:  Hospitalization Follow-up   HPI: Mallory Steele is a 85 y.o. female presenting on 12/22/2018 for Hospitalization Follow-up   Pt presents today for hospital follow. She was seen in the ED at [facility] on 12/12/2018 for headache. Pt was discharged home with hydrocodone as needed for pain. Pt states she has had minimal headaches since her discharge home. States she has been managing her headaches with tylenol. She denies fever, chills, fatigue, increased confusion from baseline, or weakness. No focal deficits.  Abnormal labs from ED visit: Na 135 Glucose 133 Creatinine 1.54 GFR 32 Alk Phos 150 CO2 20.5 WBC 13.4 Hgb 15.4 Hct 48.4 Will recheck labs today.  Relevant past medical, surgical, family, and social history reviewed and updated as indicated.  Allergies and medications reviewed and updated.   Past Medical History:  Diagnosis Date  . Anxiety   . Dementia [facility])     Past Surgical History:  Procedure Laterality Date  . APPENDECTOMY  1947  . COLON SURGERY  [phone]  . JOINT REPLACEMENT     knee replaced    Social History   Socioeconomic History  . Marital status: Widowed    Spouse name: Not on file  . Number of children: Not on file  . Years of education: Not on file  . Highest education level: Not on file  Occupational History  . Not on file  Social Needs  . Financial resource strain: Not on file  . Food insecurity:    Worry: Not on file    Inability: Not on file  . Transportation needs:    Medical: Not on file    Non-medical: Not on file  Tobacco Use  . Smoking status: Former Smoker    Packs/day: 0.25    Years: 3.00    Pack years: 0.75    Last attempt to quit: 09/22/1985    Years since quitting: 33.2  . Smokeless tobacco: Never Used  Substance and Sexual Activity  . Alcohol use: Yes    Alcohol/week: 1.0 standard drinks   Types: 1 Glasses of wine per week  . Drug use: No  . Sexual activity: Never  Lifestyle  . Physical activity:    Days per week: Not on file    Minutes per session: Not on file  . Stress: Not on file  Relationships  . Social connections:    Talks on phone: Not on file    Gets together: Not on file    Attends religious service: Not on file    Active member of club or organization: Not on file    Attends meetings of clubs or organizations: Not on file    Relationship status: Not on file  . Intimate partner violence:    Fear of current or ex partner: Not on file    Emotionally abused: Not on file    Physically abused: Not on file    Forced sexual activity: Not on file  Other Topics Concern  . Not on file  Social History Narrative  . Not on file    Outpatient Encounter Medications as of 12/22/2018  Medication Sig  . baclofen (LIORESAL) 10 MG tablet TAKE 1 TABLET BY MOUTH THREE TIMES A DAY  . clobetasol cream (TEMOVATE) 6.78 % Apply 1 application topically 2 (two) times daily.  Marland Kitchen donepezil (ARICEPT) 10 MG tablet TAKE 1 TABLET  BY MOUTH EVERY DAY  . DULoxetine (CYMBALTA) 60 MG capsule TAKE 2 CAPSULES BY MOUTH DAILY  . ferrous sulfate 325 (65 FE) MG tablet Take 1 tablet (325 mg total) by mouth daily with breakfast.  . FOLIC ACID PO Take by mouth.  . meclizine (ANTIVERT) 25 MG tablet TAKE 1 TABLET (25 MG TOTAL) BY MOUTH 3 (THREE) TIMES DAILY AS NEEDED FOR DIZZINESS.  . methotrexate (RHEUMATREX) 2.5 MG tablet   . METHOTREXATE PO Take by mouth.  . risperiDONE (RISPERDAL) 0.5 MG tablet TAKE 1 TABLET (0.5 MG TOTAL) BY MOUTH 2 (TWO) TIMES DAILY AS NEEDED.  Marland Kitchen rOPINIRole (REQUIP) 0.25 MG tablet TAKE 1 TABLET (0.25 MG TOTAL) BY MOUTH 3 (THREE) TIMES DAILY. FOR LEG CRAMPS  . traZODone (DESYREL) 50 MG tablet TAKE 1 TO 2 TABLETS AT BEDTIME AS NEEDED FOR SLEEP  . vitamin B-12 (CYANOCOBALAMIN) 250 MCG tablet Take 1 tablet (250 mcg total) by mouth daily.  . [DISCONTINUED] meloxicam (MOBIC) 15 MG  tablet TAKE 1 TABLET BY MOUTH EVERY DAY  . [DISCONTINUED] triamcinolone cream (KENALOG) 0.1 % Apply 1 application topically 2 (two) times daily.   No facility-administered encounter medications on file as of 12/22/2018.     No Known Allergies  Review of Systems  Constitutional: Negative for chills, fatigue and fever.  Respiratory: Negative for cough, choking, chest tightness and shortness of breath.   Cardiovascular: Negative for chest pain, palpitations and leg swelling.  Gastrointestinal: Negative for abdominal pain, nausea and vomiting.  Neurological: Positive for headaches. Negative for dizziness, tremors, seizures, syncope, facial asymmetry, speech difficulty, weakness, light-headedness and numbness.  Psychiatric/Behavioral: Positive for confusion (dementia).  All other systems reviewed and are negative.       Objective:    BP 126/74   Pulse 76   Temp 98 F (36.7 C) (Oral)   Ht '5\' 5"'  (1.651 m)   Wt 170 lb (77.1 kg)   BMI 28.29 kg/m    Wt Readings from Last 3 Encounters:  12/22/18 170 lb (77.1 kg)  09/18/18 169 lb 15.7 oz (77.1 kg)  08/03/18 168 lb (76.2 kg)    Physical Exam Vitals signs and nursing note reviewed.  Constitutional:      General: She is not in acute distress.    Appearance: Normal appearance. She is not ill-appearing or toxic-appearing.  HENT:     Head: Normocephalic and atraumatic.     Right Ear: Tympanic membrane, ear canal and external ear normal.     Left Ear: Tympanic membrane, ear canal and external ear normal.     Nose: Nose normal. No congestion or rhinorrhea.     Mouth/Throat:     Mouth: Mucous membranes are moist.     Pharynx: Oropharynx is clear. No oropharyngeal exudate or posterior oropharyngeal erythema.  Eyes:     Conjunctiva/sclera: Conjunctivae normal.     Pupils: Pupils are equal, round, and reactive to light.  Neck:     Musculoskeletal: Normal range of motion and neck supple. No neck rigidity.     Vascular: No carotid bruit.    Cardiovascular:     Rate and Rhythm: Normal rate and regular rhythm.     Heart sounds: Normal heart sounds. No murmur. No friction rub. No gallop.   Pulmonary:     Effort: Pulmonary effort is normal. No respiratory distress.     Breath sounds: Normal breath sounds.  Abdominal:     General: Bowel sounds are normal.     Palpations: Abdomen is soft.  Tenderness: There is no abdominal tenderness.  Lymphadenopathy:     Cervical: No cervical adenopathy.  Skin:    General: Skin is warm and dry.  Neurological:     General: No focal deficit present.     Mental Status: She is alert. Mental status is at baseline.     Cranial Nerves: No cranial nerve deficit.     Sensory: No sensory deficit.     Motor: No weakness.     Coordination: Coordination normal.     Gait: Gait normal.     Deep Tendon Reflexes: Reflexes normal.  Psychiatric:        Mood and Affect: Mood normal.        Behavior: Behavior normal. Behavior is cooperative.        Thought Content: Thought content normal.        Judgment: Judgment normal.     Results for orders placed or performed in visit on 09/18/18  [facility]  Result Value Ref Range   Glucose 86 65 - 99 mg/dL   BUN 13 8 - 27 mg/dL   Creatinine, Ser 1.08 (H) 0.57 - 1.00 mg/dL   GFR calc non Af Amer 47 (L) >59 mL/min/1.73   GFR calc Af Amer 54 (L) >59 mL/min/1.73   BUN/Creatinine Ratio 12 12 - 28   Sodium 141 134 - 144 mmol/L   Potassium 4.6 3.5 - 5.2 mmol/L   Chloride 102 96 - 106 mmol/L   CO2 22 20 - 29 mmol/L   Calcium 9.0 8.7 - 10.3 mg/dL       Pertinent labs & imaging results that were available during my care of the patient were reviewed by me and considered in my medical decision making.  Assessment & Plan:  Kendel was seen today for hospitalization follow-up.  Diagnoses and all orders for this visit:  Hospital discharge follow-up -     CBC with Differential/Platelet -     CMP14+EGFR  Frontal headache Continue tylenol as needed for  headache. Report any new or worsening symptoms.   Hyponatremia -     CMP14+EGFR   Labs pending.   Continue all other maintenance medications.  Follow up plan: Return in about 4 weeks (around 01/19/2019) for PCP.  Educational handout given for headache  The above assessment and management plan was discussed with the patient. The patient verbalized understanding of and has agreed to the management plan. Patient is aware to call the clinic if symptoms persist or worsen. Patient is aware when to return to the clinic for a follow-up visit. Patient educated on when it is appropriate to go to the emergency department.   Monia Pouch, FNP-C [facility] [phone]

## 2018-12-22 NOTE — Patient Instructions (Signed)
General Headache Without Cause A headache is pain or discomfort that is felt around the head or neck area. There are many causes and types of headaches. In some cases, the cause may not be found. Follow these instructions at home: Watch your condition for any changes. Let your doctor know about them. Take these steps to help with your condition: Managing pain      Take over-the-counter and prescription medicines only as told by your doctor.  Lie down in a dark, quiet room when you have a headache.  If told, put ice on your head and neck area: ? Put ice in a plastic bag. ? Place a towel between your skin and the bag. ? Leave the ice on for 20 minutes, 2-3 times per day.  If told, put heat on the affected area. Use the heat source that your doctor recommends, such as a moist heat pack or a heating pad. ? Place a towel between your skin and the heat source. ? Leave the heat on for 20-30 minutes. ? Remove the heat if your skin turns bright red. This is very important if you are unable to feel pain, heat, or cold. You may have a greater risk of getting burned.  Keep lights dim if bright lights bother you or make your headaches worse. Eating and drinking  Eat meals on a regular schedule.  If you drink alcohol: ? Limit how much you use to:  0-1 drink a day for women.  0-2 drinks a day for men. ? Be aware of how much alcohol is in your drink. In the [place] one drink equals one 12 oz bottle of beer (355 mL), one 5 oz glass of wine (148 mL), or one 1 oz glass of hard liquor (44 mL).  Stop drinking caffeine, or reduce how much caffeine you drink. General instructions   Keep a journal to find out if certain things bring on headaches. For example, write down: ? What you eat and drink. ? How much sleep you get. ? Any change to your diet or medicines.  Get a massage or try other ways to relax.  Limit stress.  Sit up straight. Do not tighten (tense) your muscles.  Do not use any  products that contain nicotine or tobacco. This includes cigarettes, e-cigarettes, and chewing tobacco. If you need help quitting, ask your doctor.  Exercise regularly as told by your doctor.  Get enough sleep. This often means 7-9 hours of sleep each night.  Keep all follow-up visits as told by your doctor. This is important. Contact a doctor if:  Your symptoms are not helped by medicine.  You have a headache that feels different than the other headaches.  You feel sick to your stomach (nauseous) or you throw up (vomit).  You have a fever. Get help right away if:  Your headache gets very bad quickly.  Your headache gets worse after a lot of physical activity.  You keep throwing up.  You have a stiff neck.  You have trouble seeing.  You have trouble speaking.  You have pain in the eye or ear.  Your muscles are weak or you lose muscle control.  You lose your balance or have trouble walking.  You feel like you will pass out (faint) or you pass out.  You are mixed up (confused).  You have a seizure. Summary  A headache is pain or discomfort that is felt around the head or neck area.  There are many causes and   types of headaches. In some cases, the cause may not be found.  Keep a journal to help find out what causes your headaches. Watch your condition for any changes. Let your doctor know about them.  Contact a doctor if you have a headache that is different from usual, or if your headache is not helped by medicine.  Get help right away if your headache gets very bad, you throw up, you have trouble seeing, you lose your balance, or you have a seizure. This information is not intended to replace advice given to you by your health care provider. Make sure you discuss any questions you have with your health care provider. Document Released: 08/17/2008 Document Revised: 05/29/2018 Document Reviewed: 05/29/2018 Elsevier Interactive Patient Education  2019 Elsevier  Inc.  

## 2018-12-23 LAB — CBC WITH DIFFERENTIAL/PLATELET
Basophils Absolute: 0 10*3/uL (ref 0.0–0.2)
Basos: 0 %
EOS (ABSOLUTE): 0.1 10*3/uL (ref 0.0–0.4)
EOS: 1 %
Hematocrit: 40 % (ref 34.0–46.6)
Hemoglobin: 13 g/dL (ref 11.1–15.9)
Immature Grans (Abs): 0 10*3/uL (ref 0.0–0.1)
Immature Granulocytes: 1 %
Lymphocytes Absolute: 1.6 10*3/uL (ref 0.7–3.1)
Lymphs: 30 %
MCH: 29.9 pg (ref 26.6–33.0)
MCHC: 32.5 g/dL (ref 31.5–35.7)
MCV: 92 fL (ref 79–97)
MONOCYTES: 8 %
Monocytes Absolute: 0.5 10*3/uL (ref 0.1–0.9)
Neutrophils Absolute: 3.2 10*3/uL (ref 1.4–7.0)
Neutrophils: 60 %
PLATELETS: 171 10*3/uL (ref 150–450)
RBC: 4.35 x10E6/uL (ref 3.77–5.28)
RDW: 13 % (ref 11.7–15.4)
WBC: 5.3 10*3/uL (ref 3.4–10.8)

## 2018-12-23 LAB — CMP14+EGFR
ALT: 14 IU/L (ref 0–32)
AST: 22 IU/L (ref 0–40)
Albumin/Globulin Ratio: 1.8 (ref 1.2–2.2)
Albumin: 4.2 g/dL (ref 3.6–4.6)
Alkaline Phosphatase: 110 IU/L (ref 39–117)
BUN/Creatinine Ratio: 10 — ABNORMAL LOW (ref 12–28)
BUN: 13 mg/dL (ref 8–27)
Bilirubin Total: 0.3 mg/dL (ref 0.0–1.2)
CO2: 20 mmol/L (ref 20–29)
Calcium: 8.6 mg/dL — ABNORMAL LOW (ref 8.7–10.3)
Chloride: 102 mmol/L (ref 96–106)
Creatinine, Ser: 1.25 mg/dL — ABNORMAL HIGH (ref 0.57–1.00)
GFR calc Af Amer: 46 mL/min/{1.73_m2} — ABNORMAL LOW (ref >59)
GFR calc non Af Amer: 40 mL/min/{1.73_m2} — ABNORMAL LOW (ref >59)
Globulin, Total: 2.3 g/dL (ref 1.5–4.5)
Glucose: 118 mg/dL — ABNORMAL HIGH (ref 65–99)
Potassium: 4.2 mmol/L (ref 3.5–5.2)
Sodium: 139 mmol/L (ref 134–144)
Total Protein: 6.5 g/dL (ref 6.0–8.5)

## 2019-01-23 ENCOUNTER — Ambulatory Visit: Payer: Medicare HMO | Admitting: Family Medicine

## 2019-02-02 ENCOUNTER — Other Ambulatory Visit: Payer: Self-pay | Admitting: Family Medicine

## 2019-02-02 DIAGNOSIS — D509 Iron deficiency anemia, unspecified: Secondary | ICD-10-CM

## 2019-03-18 ENCOUNTER — Other Ambulatory Visit: Payer: Self-pay | Admitting: Family Medicine

## 2019-03-18 ENCOUNTER — Other Ambulatory Visit: Payer: Self-pay | Admitting: Family

## 2019-03-18 DIAGNOSIS — F03918 Unspecified dementia, unspecified severity, with other behavioral disturbance: Secondary | ICD-10-CM

## 2019-03-18 DIAGNOSIS — F0391 Unspecified dementia with behavioral disturbance: Secondary | ICD-10-CM

## 2019-03-18 DIAGNOSIS — F331 Major depressive disorder, recurrent, moderate: Secondary | ICD-10-CM

## 2019-03-19 ENCOUNTER — Other Ambulatory Visit: Payer: Self-pay | Admitting: Family Medicine

## 2019-03-19 DIAGNOSIS — F331 Major depressive disorder, recurrent, moderate: Secondary | ICD-10-CM

## 2019-03-20 ENCOUNTER — Ambulatory Visit: Payer: Medicare HMO | Admitting: Family Medicine

## 2019-03-21 ENCOUNTER — Other Ambulatory Visit: Payer: Self-pay | Admitting: Family Medicine

## 2019-03-21 DIAGNOSIS — R252 Cramp and spasm: Secondary | ICD-10-CM

## 2019-04-10 ENCOUNTER — Telehealth: Payer: Self-pay | Admitting: Family Medicine

## 2019-04-11 ENCOUNTER — Ambulatory Visit: Payer: Medicare HMO | Admitting: Family Medicine

## 2019-04-13 DIAGNOSIS — K5792 Diverticulitis of intestine, part unspecified, without perforation or abscess without bleeding: Secondary | ICD-10-CM | POA: Diagnosis not present

## 2019-04-13 DIAGNOSIS — G309 Alzheimer's disease, unspecified: Secondary | ICD-10-CM | POA: Diagnosis not present

## 2019-04-13 DIAGNOSIS — R1084 Generalized abdominal pain: Secondary | ICD-10-CM | POA: Diagnosis not present

## 2019-04-13 DIAGNOSIS — F0280 Dementia in other diseases classified elsewhere without behavioral disturbance: Secondary | ICD-10-CM | POA: Diagnosis not present

## 2019-04-13 DIAGNOSIS — R079 Chest pain, unspecified: Secondary | ICD-10-CM | POA: Diagnosis not present

## 2019-04-13 DIAGNOSIS — Z87891 Personal history of nicotine dependence: Secondary | ICD-10-CM | POA: Diagnosis not present

## 2019-04-13 DIAGNOSIS — K5732 Diverticulitis of large intestine without perforation or abscess without bleeding: Secondary | ICD-10-CM | POA: Diagnosis not present

## 2019-04-13 DIAGNOSIS — R1030 Lower abdominal pain, unspecified: Secondary | ICD-10-CM | POA: Diagnosis not present

## 2019-04-13 DIAGNOSIS — Z79899 Other long term (current) drug therapy: Secondary | ICD-10-CM | POA: Diagnosis not present

## 2019-04-15 ENCOUNTER — Other Ambulatory Visit: Payer: Self-pay | Admitting: Family Medicine

## 2019-04-26 ENCOUNTER — Other Ambulatory Visit: Payer: Self-pay | Admitting: Family Medicine

## 2019-04-26 DIAGNOSIS — D509 Iron deficiency anemia, unspecified: Secondary | ICD-10-CM

## 2019-06-06 ENCOUNTER — Telehealth: Payer: Self-pay | Admitting: Family Medicine

## 2019-06-06 NOTE — Chronic Care Management (AMB) (Signed)
°  Chronic Care Management   Outreach Note  06/06/2019 Name: Mallory Steele MRN: [id number] DOB: 11-Dec-1931  Referred by: Dettinger, Fransisca Kaufmann, MD Reason for referral : Chronic Care Management (Initial CCM outreach was unsuccessful. )   An unsuccessful telephone outreach was attempted today. The patient was referred to the case management team by for assistance with chronic care management and care coordination.   Follow Up Plan: The care management team will reach out to the patient again over the next 7 days.   [place]  [email or web address]   ??[phone]

## 2019-06-12 ENCOUNTER — Other Ambulatory Visit: Payer: Self-pay | Admitting: Family Medicine

## 2019-06-12 DIAGNOSIS — F331 Major depressive disorder, recurrent, moderate: Secondary | ICD-10-CM

## 2019-06-12 DIAGNOSIS — F03918 Unspecified dementia, unspecified severity, with other behavioral disturbance: Secondary | ICD-10-CM

## 2019-06-12 DIAGNOSIS — F0391 Unspecified dementia with behavioral disturbance: Secondary | ICD-10-CM

## 2019-06-12 DIAGNOSIS — R252 Cramp and spasm: Secondary | ICD-10-CM

## 2019-06-12 NOTE — Chronic Care Management (AMB) (Signed)
Chronic Care Management   Note  06/12/2019 Name: LOVENIA DEBRULER MRN: [id number] DOB: 04/22/1932  Nunzio Cory Consalvo is a 85 y.o. year old female who is a primary care patient of Dettinger, Fransisca Kaufmann, MD. I reached out to Lorenso Quarry by phone today in response to a referral sent by Ms. Soley K Weniger's health plan.    Ms. Smestad was given information about Chronic Care Management services today including:  1. CCM service includes personalized support from designated clinical staff supervised by her physician, including individualized plan of care and coordination with other care providers 2. 24/7 contact phone numbers for assistance for urgent and routine care needs. 3. Service will only be billed when office clinical staff spend 20 minutes or more in a month to coordinate care. 4. Only one practitioner may furnish and bill the service in a calendar month. 5. The patient may stop CCM services at any time (effective at the end of the month) by phone call to the office staff. 6. The patient will be responsible for cost sharing (co-pay) of up to 20% of the service fee (after annual deductible is met).  Patient agreed to services and verbal consent obtained.   Follow up plan: Telephone appointment with CCM team member scheduled for: 06/15/2019  [place]  [email or web address]   ??[phone]

## 2019-06-15 ENCOUNTER — Ambulatory Visit (INDEPENDENT_AMBULATORY_CARE_PROVIDER_SITE_OTHER): Payer: Medicare HMO | Admitting: Licensed Clinical Social Worker

## 2019-06-15 DIAGNOSIS — N183 Chronic kidney disease, stage 3 unspecified: Secondary | ICD-10-CM

## 2019-06-15 DIAGNOSIS — G301 Alzheimer's disease with late onset: Secondary | ICD-10-CM

## 2019-06-15 DIAGNOSIS — F0281 Dementia in other diseases classified elsewhere with behavioral disturbance: Secondary | ICD-10-CM

## 2019-06-15 DIAGNOSIS — E871 Hypo-osmolality and hyponatremia: Secondary | ICD-10-CM

## 2019-06-15 DIAGNOSIS — D509 Iron deficiency anemia, unspecified: Secondary | ICD-10-CM | POA: Diagnosis not present

## 2019-06-15 DIAGNOSIS — F331 Major depressive disorder, recurrent, moderate: Secondary | ICD-10-CM | POA: Diagnosis not present

## 2019-06-15 DIAGNOSIS — F419 Anxiety disorder, unspecified: Secondary | ICD-10-CM

## 2019-06-15 NOTE — Patient Instructions (Addendum)
Licensed Clinical Social Worker Visit Information  Materials Provided: No  Client has provided consent for CCM services. LCSW and client spoke via phone today regarding CCM services available. LCSW and client completed GAD 7 and PHQ2/9. LCSW talked with client about CCM nursing support and LCSW support.  LCSW started talking with client about goals in care plan. Client started talking with LCSW about client plan and became confused and she abruptly hung up on LCSW on 06/15/2019. Again, client has provided consent for CCM support. LCSW will try to call client again in next 2 weeks to talk with her more about program support services.   Follow Up Plan: LCSW to call client in next 2 weeks to talk with client about CCM program support   The patient verbalized understanding of instructions provided today and declined a print copy of patient instruction materials.   [name].[name] MSW, LCSW Licensed Clinical Social Worker [facility] Care Management [phone]

## 2019-06-15 NOTE — Chronic Care Management (AMB) (Signed)
  Care Management Note   Mallory Steele is a 85 y.o. year old female who is a primary care patient of Dettinger, Fransisca Kaufmann, MD. The CM team was consulted for assistance with chronic disease management and care coordination.   I reached out to Mallory Steele by phone today.   Review of patient status, including review of consultants reports, relevant laboratory and other test results, and collaboration with appropriate care team members and the patient's provider was performed as part of comprehensive patient evaluation and provision of chronic care management services.   Social Determinants of Health: risk of Depression; risk of tobacco use    Chronic Care Management from 06/15/2019 in [facility]  PHQ-9 Total Score  10     GAD 7 : Generalized Anxiety Score 06/15/2019  Nervous, Anxious, on Edge 1  Control/stop worrying 3  Worry too much - different things 1  Trouble relaxing 0  Restless 0  Easily annoyed or irritable 1  Afraid - awful might happen 0  Total GAD 7 Score 6  Anxiety Difficulty Somewhat difficult   Client has provided consent for CCM services. LCSW and client spoke via phone today regarding CCM services available. LCSW and client completed GAD 7 and PHQ2/9. LCSW talked with client about CCM nursing support and LCSW support.  LCSW started talking with client about goals in care plan. Client started talking with LCSW about client plan and became confused and she abruptly hung up on LCSW on 06/15/2019. Again, client has provided consent for CCM support. LCSW will try to call client again in next 2 weeks to talk with her more about program support services.   Follow Up Plan: LCSW to call client in next 2 weeks to talk with her about CCM program support  Mallory Steele.Clerence Gubser MSW, LCSW Licensed Clinical Social Worker [facility] Care Management [phone]

## 2019-06-22 ENCOUNTER — Ambulatory Visit: Payer: Self-pay | Admitting: Licensed Clinical Social Worker

## 2019-06-22 DIAGNOSIS — E871 Hypo-osmolality and hyponatremia: Secondary | ICD-10-CM

## 2019-06-22 DIAGNOSIS — F0281 Dementia in other diseases classified elsewhere with behavioral disturbance: Secondary | ICD-10-CM

## 2019-06-22 DIAGNOSIS — N183 Chronic kidney disease, stage 3 unspecified: Secondary | ICD-10-CM

## 2019-06-22 DIAGNOSIS — D509 Iron deficiency anemia, unspecified: Secondary | ICD-10-CM

## 2019-06-22 DIAGNOSIS — F331 Major depressive disorder, recurrent, moderate: Secondary | ICD-10-CM

## 2019-06-22 DIAGNOSIS — F419 Anxiety disorder, unspecified: Secondary | ICD-10-CM

## 2019-06-22 DIAGNOSIS — G301 Alzheimer's disease with late onset: Secondary | ICD-10-CM

## 2019-06-22 NOTE — Patient Instructions (Signed)
Licensed Clinical Social Worker Visit Information  Goals we discussed today:  Goals Addressed            This Visit's Progress   . Client said she has financial challenges and it is hard to pay for incontinent supplies montylyi (pt-stated)       Current Barriers:  . Financial challenges  Clinical Social Work Clinical Goal(s):  . Client and LCSW will communicate in next 30 days to talk about financial needs of client.  Interventions: . Talked with client about CCM program services. . Talked with client about [place] with RNCM . Talked with client about financial needs of client . Talked with client about her frequent use of incontinent supplies and difficulty in          paying for these supplies  Patient Self Care Activities:  . Takes medications as prescribed . Attends scheduled medical appointments  Plan:   Client to attend scheduled client medical appointments LCSW to call client or her daughter in next 3 weeks to talk with client or her daughter about financial needs of client and client procurement of incontinent supplies Client to talk with [facility] regarding nursing needs of client  Initial goal documentation         Materials Provided: No  Follow Up Plan: LCSW to call client or her daughter in next 3 weeks to talk with client or her daughter about financial needs of client and client procurement of incontinent supplies  The patient verbalized understanding of instructions provided today and declined a print copy of patient instruction materials.   [name].Lidiya Reise MSW, LCSW Licensed Clinical Social Worker [facility] Care Management [phone]

## 2019-06-22 NOTE — Chronic Care Management (AMB) (Signed)
  Care Management Note   Mallory Steele is a 85 y.o. year old female who is a primary care patient of Dettinger, Fransisca Kaufmann, MD. The CM team was consulted for assistance with chronic disease management and care coordination.   I reached out to Lorenso Quarry by phone today.   Review of patient status, including review of consultants reports, relevant laboratory and other test results, and collaboration with appropriate care team members and the patient's provider was performed as part of comprehensive patient evaluation and provision of chronic care management services.   Social Determinants of Health: Financial Strain  Goals Addressed            This Visit's Progress   . Client said she has financial challenges and it is hard to pay for incontinent supplies monthly (pt-stated)       Current Barriers:  . Financial challenges  Clinical Social Work Clinical Goal(s):  . Client and LCSW will communicate in next 30 days to talk about financial needs of client.  Interventions: . Talked with client about CCM program services. . Talked with client about [place] with RNCM . Talked with client about financial needs of client . Talked with client about her frequent use of incontinent supplies and difficulty in          paying for these supplies  Patient Self Care Activities:  . Takes medications as prescribed . Attends scheduled medical appointments  Plan:   Client to attend scheduled client medical appointments LCSW to call client or her daughter in next 3 weeks to talk with client or her daughter about financial needs of client and client procurement of incontinent supplies Client to talk with [facility] regarding nursing needs of client  Initial goal documentation      Client and LCSW spoke of financial needs of client. She said she needs to be able to obtain incontinent supplies each month but it is expensive to pay for these supplies. She has support from her daughter. She  lives with her daughter.  LCSW informed client that LCSW would collaborate with RNCM to talk with RNCM about resources for such supplies for client. Client agreed to this plan.  LCSW encouraged client to call LCSW as needed to discuss social work needs of client  Follow Up Plan: LCSW to call client or her daughter in next 3 weeks to talk with client or her daughter about financial needs of client and about client procurement of incontinent supplies  [name].Tyresse Jayson MSW, LCSW Licensed Clinical Social Worker [facility] Care Management [phone]

## 2019-07-02 ENCOUNTER — Telehealth: Payer: Self-pay | Admitting: Family Medicine

## 2019-07-02 NOTE — Telephone Encounter (Signed)
Please make patient an appt to be seen. May need urine test to rule out UTI

## 2019-07-04 NOTE — Telephone Encounter (Signed)
Yes please have her schedule an appointment that we can discuss this.

## 2019-07-05 NOTE — Telephone Encounter (Signed)
Aware and verbalizes understanding.  

## 2019-07-08 ENCOUNTER — Other Ambulatory Visit: Payer: Self-pay | Admitting: Family Medicine

## 2019-07-08 DIAGNOSIS — F331 Major depressive disorder, recurrent, moderate: Secondary | ICD-10-CM

## 2019-07-08 DIAGNOSIS — R252 Cramp and spasm: Secondary | ICD-10-CM

## 2019-07-13 ENCOUNTER — Ambulatory Visit (INDEPENDENT_AMBULATORY_CARE_PROVIDER_SITE_OTHER): Payer: Medicare HMO | Admitting: Licensed Clinical Social Worker

## 2019-07-13 DIAGNOSIS — G301 Alzheimer's disease with late onset: Secondary | ICD-10-CM | POA: Diagnosis not present

## 2019-07-13 DIAGNOSIS — N183 Chronic kidney disease, stage 3 unspecified: Secondary | ICD-10-CM

## 2019-07-13 DIAGNOSIS — F331 Major depressive disorder, recurrent, moderate: Secondary | ICD-10-CM

## 2019-07-13 DIAGNOSIS — D509 Iron deficiency anemia, unspecified: Secondary | ICD-10-CM

## 2019-07-13 DIAGNOSIS — F0281 Dementia in other diseases classified elsewhere with behavioral disturbance: Secondary | ICD-10-CM | POA: Diagnosis not present

## 2019-07-13 DIAGNOSIS — F02818 Dementia in other diseases classified elsewhere, unspecified severity, with other behavioral disturbance: Secondary | ICD-10-CM

## 2019-07-13 DIAGNOSIS — F419 Anxiety disorder, unspecified: Secondary | ICD-10-CM

## 2019-07-13 NOTE — Patient Instructions (Signed)
Licensed Clinical Social Worker Visit Information  Goals we discussed today:   Goals    . Client said she has financial challenges and it is hard to pay for incontinent supplies montylyi (pt-stated)     Current Barriers:  . Financial challenges  Clinical Social Work Clinical Goal(s):  . Client and LCSW will communicate in next 30 days to talk about financial needs of client.  Interventions: . Talked with client about CCM program services. . Talked with client about [place] with RNCM . Talked with client about financial needs of client . Talked with client about her frequent use of incontinent supplies and difficulty in          paying for these supplies  Patient Self Care Activities:  . Takes medications as prescribed . Attends scheduled medical appointments  Plan:   Client to attend scheduled client medical appointments LCSW to call client or her daughter in next 3 weeks to talk with client or her Daughter about financial needs of client and client procurement of incontinent supplies Client to talk with [facility] regarding nursing needs of client  Initial goal documentation       Materials Provided: No  Follow Up Plan: LCSW to call client or daughter of client in next 3 weeks to talk with client or her daughter about financial needs of client.   The patient verbalized understanding of instructions provided today and declined a print copy of patient instruction materials.   [name].Carmello Cabiness MSW, LCSW Licensed Clinical Social Worker [facility] Care Management [phone]

## 2019-07-13 NOTE — Chronic Care Management (AMB) (Signed)
Care Management Note   Mallory Steele is a 85 y.o. year old female who is a primary care patient of Dettinger, Fransisca Kaufmann, MD. The CM team was consulted for assistance with chronic disease management and care coordination.   I reached out to Mallory Steele by phone today.   Review of patient status, including review of consultants reports, relevant laboratory and other test results, and collaboration with appropriate care team members and the patient's provider was performed as part of comprehensive patient evaluation and provision of chronic care management services.   Social Determinants of Health: risk of tobacco use; risk of depression    Chronic Care Management from 06/15/2019 in [facility]  PHQ-9 Total Score  10     GAD 7 : Generalized Anxiety Score 06/15/2019  Nervous, Anxious, on Edge 1  Control/stop worrying 3  Worry too much - different things 1  Trouble relaxing 0  Restless 0  Easily annoyed or irritable 1  Afraid - awful might happen 0  Total GAD 7 Score 6  Anxiety Difficulty Somewhat difficult   Medications   New medications from outside sources are available for reconciliation   baclofen (LIORESAL) 10 MG tablet    clobetasol cream (TEMOVATE) 0.05 %    donepezil (ARICEPT) 10 MG tablet    DULoxetine (CYMBALTA) 60 MG capsule    ferrous sulfate 325 (65 FE) MG tablet    FOLIC ACID PO    meclizine (ANTIVERT) 25 MG tablet    methotrexate (RHEUMATREX) 2.5 MG tablet    METHOTREXATE PO    risperiDONE (RISPERDAL) 0.5 MG tablet    rOPINIRole (REQUIP) 0.25 MG tablet    traZODone (DESYREL) 50 MG tablet    vitamin B-12 (CYANOCOBALAMIN) 250 MCG tablet     Goals    . Client said she has financial challenges and it is hard to pay for incontinent supplies montylyi (pt-stated)     Current Barriers:  . Financial challenges  Clinical Social Work Clinical Goal(s):  . Client and LCSW will communicate in next 30 days to talk about financial needs of client.   Interventions: . Talked with client about CCM program services. . Talked with client about [place] with RNCM . Talked with client about financial needs of client . Talked with client about her frequent use of incontinent supplies and difficulty in          paying for these supplies  Patient Self Care Activities:  . Takes medications as prescribed . Attends scheduled medical appointments  Plan:   Client to attend scheduled client medical appointments LCSW to call client or her daughter in next 3 weeks to talk with client or her Daughter about financial needs of client and client procurement of incontinent supplies Client to talk with [facility] regarding nursing needs of client  Initial goal documentation      Client said she has an appointment with Dr. Warrick Parisian next week. She did not speak of any pain issues.  She has her prescribed medications and is taking medications as prescribed. She said it continues to be difficult to pay for incontinent supplies.  She said she is using 2 or 3 packs a week of incontinent briefs. She said she has financial challenges.  She receives a set income from social security.  She said she is sleeping well.  She said she is eating well. LCSW encouraged client to call [employer] benefits number to talk with [facility] representative about resources for client 's  needs. LCSW encouraged client to call RNCM to discuss nursing needs of client  Follow Up Plan: LCSW to call client or her daughter in the next 3 weeks to talk about financial needs of client at that time.  Mallory Steele.Mallory Steele MSW, LCSW Licensed Clinical Social Worker [facility] Care Management [phone]

## 2019-07-19 ENCOUNTER — Ambulatory Visit (INDEPENDENT_AMBULATORY_CARE_PROVIDER_SITE_OTHER): Payer: Medicare HMO | Admitting: Family Medicine

## 2019-07-19 ENCOUNTER — Other Ambulatory Visit: Payer: Self-pay

## 2019-07-19 ENCOUNTER — Ambulatory Visit: Payer: Medicare HMO | Admitting: Family Medicine

## 2019-07-19 ENCOUNTER — Encounter: Payer: Self-pay | Admitting: Family Medicine

## 2019-07-19 DIAGNOSIS — F0281 Dementia in other diseases classified elsewhere with behavioral disturbance: Secondary | ICD-10-CM | POA: Diagnosis not present

## 2019-07-19 DIAGNOSIS — N3942 Incontinence without sensory awareness: Secondary | ICD-10-CM | POA: Diagnosis not present

## 2019-07-19 DIAGNOSIS — F02818 Dementia in other diseases classified elsewhere, unspecified severity, with other behavioral disturbance: Secondary | ICD-10-CM

## 2019-07-19 DIAGNOSIS — F331 Major depressive disorder, recurrent, moderate: Secondary | ICD-10-CM | POA: Diagnosis not present

## 2019-07-19 DIAGNOSIS — G301 Alzheimer's disease with late onset: Secondary | ICD-10-CM

## 2019-07-19 MED ORDER — RISPERIDONE 0.5 MG PO TABS: 0.5000 mg | ORAL_TABLET | Freq: Two times a day (BID) | ORAL | 1 refills | Status: DC | PRN

## 2019-07-19 NOTE — Progress Notes (Signed)
Virtual Visit via telephone Note  I connected with Mallory Steele on 07/19/19 at 1636 by telephone and verified that I am speaking with the correct person using two identifiers. Mallory Steele is currently located at home and daughter are currently with her during visit. The provider, Fransisca Kaufmann Jazmon Kos, MD is located in their office at time of visit.  Call ended at 1644  I discussed the limitations, risks, security and privacy concerns of performing an evaluation and management service by telephone and the availability of in person appointments. I also discussed with the patient that there may be a patient responsible charge related to this service. The patient expressed understanding and agreed to proceed.   History and Present Illness: Patients daughter is calling in about urinary incontinence and frequent accidents and needs a prescription for pullups or depends.  She denies any dysuria or fever or abdominal pain.   No diagnosis found.  Outpatient Encounter Medications as of 07/19/2019  Medication Sig  . baclofen (LIORESAL) 10 MG tablet TAKE 1 TABLET BY MOUTH THREE TIMES A DAY  . clobetasol cream (TEMOVATE) [id number] % Apply 1 application topically 2 (two) times daily.  Marland Kitchen donepezil (ARICEPT) 10 MG tablet TAKE 1 TABLET BY MOUTH EVERY DAY  . DULoxetine (CYMBALTA) 60 MG capsule Take 2 capsules (120 mg total) by mouth daily. (Needs to be seen before next refill)  . ferrous sulfate 325 (65 FE) MG tablet TAKE 1 TABLET BY MOUTH EVERY DAY WITH BREAKFAST  . FOLIC ACID PO Take by mouth.  . meclizine (ANTIVERT) 25 MG tablet TAKE 1 TABLET (25 MG TOTAL) BY MOUTH 3 (THREE) TIMES DAILY AS NEEDED FOR DIZZINESS.  . methotrexate (RHEUMATREX) 2.5 MG tablet   . METHOTREXATE PO Take by mouth.  . risperiDONE (RISPERDAL) 0.5 MG tablet Take 1 tablet (0.5 mg total) by mouth 2 (two) times daily as needed. (Needs to be seen before next refill)  . rOPINIRole (REQUIP) 0.25 MG tablet Take 1 tablet (0.25 mg total) by  mouth 3 (three) times daily.  . traZODone (DESYREL) 50 MG tablet TAKE 1 TO 2 TABLETS BY MOUTH AT BEDTIME AS NEEDED FOR SLEEP (Needs to be seen before next refill)  . vitamin B-12 (CYANOCOBALAMIN) 250 MCG tablet TAKE 1 TABLET BY MOUTH EVERY DAY   No facility-administered encounter medications on file as of 07/19/2019.     Review of Systems  Constitutional: Negative for chills and fever.  Eyes: Negative for visual disturbance.  Respiratory: Negative for chest tightness and shortness of breath.   Cardiovascular: Negative for chest pain and leg swelling.  Gastrointestinal: Negative for abdominal pain.  Genitourinary: Positive for frequency. Negative for difficulty urinating, dysuria, hematuria, urgency, vaginal bleeding, vaginal discharge and vaginal pain.  Musculoskeletal: Negative for back pain and gait problem.  Skin: Negative for rash.  Neurological: Negative for light-headedness and headaches.  Psychiatric/Behavioral: Negative for agitation and behavioral problems.  All other systems reviewed and are negative.   Observations/Objective: Patient sounds comfortable and in no acute distress  Assessment and Plan: Problem List Items Addressed This Visit      Nervous and Auditory   Dementia ([place])    Other Visit Diagnoses    Urinary incontinence without sensory awareness    -  Primary   Relevant Orders   For home use only DME Other see comment      Worsening dementia and incontinence Follow Up Instructions: As needed    I discussed the assessment and treatment plan with the patient. The  patient was provided an opportunity to ask questions and all were answered. The patient agreed with the plan and demonstrated an understanding of the instructions.   The patient was advised to call back or seek an in-person evaluation if the symptoms worsen or if the condition fails to improve as anticipated.  The above assessment and management plan was discussed with the patient. The patient  verbalized understanding of and has agreed to the management plan. Patient is aware to call the clinic if symptoms persist or worsen. Patient is aware when to return to the clinic for a follow-up visit. Patient educated on when it is appropriate to go to the emergency department.    I provided 8 minutes of non-face-to-face time during this encounter.    Worthy Rancher, MD

## 2019-07-19 NOTE — Addendum Note (Signed)
Addended by: Caryl Pina on: 07/19/2019 04:46 PM   Modules accepted: Orders

## 2019-07-23 ENCOUNTER — Other Ambulatory Visit: Payer: Self-pay | Admitting: Family Medicine

## 2019-07-23 DIAGNOSIS — R252 Cramp and spasm: Secondary | ICD-10-CM

## 2019-07-24 ENCOUNTER — Other Ambulatory Visit: Payer: Self-pay | Admitting: Family Medicine

## 2019-07-24 DIAGNOSIS — D509 Iron deficiency anemia, unspecified: Secondary | ICD-10-CM

## 2019-08-06 ENCOUNTER — Ambulatory Visit (INDEPENDENT_AMBULATORY_CARE_PROVIDER_SITE_OTHER): Payer: Medicare HMO | Admitting: Licensed Clinical Social Worker

## 2019-08-06 DIAGNOSIS — N183 Chronic kidney disease, stage 3 unspecified: Secondary | ICD-10-CM

## 2019-08-06 DIAGNOSIS — F419 Anxiety disorder, unspecified: Secondary | ICD-10-CM

## 2019-08-06 DIAGNOSIS — F331 Major depressive disorder, recurrent, moderate: Secondary | ICD-10-CM | POA: Diagnosis not present

## 2019-08-06 DIAGNOSIS — F0281 Dementia in other diseases classified elsewhere with behavioral disturbance: Secondary | ICD-10-CM | POA: Diagnosis not present

## 2019-08-06 DIAGNOSIS — G301 Alzheimer's disease with late onset: Secondary | ICD-10-CM | POA: Diagnosis not present

## 2019-08-06 DIAGNOSIS — F02818 Dementia in other diseases classified elsewhere, unspecified severity, with other behavioral disturbance: Secondary | ICD-10-CM

## 2019-08-06 DIAGNOSIS — E871 Hypo-osmolality and hyponatremia: Secondary | ICD-10-CM

## 2019-08-06 DIAGNOSIS — D509 Iron deficiency anemia, unspecified: Secondary | ICD-10-CM | POA: Diagnosis not present

## 2019-08-06 NOTE — Chronic Care Management (AMB) (Signed)
Care Management Note   Mallory Steele is a 85 y.o. year old female who is a primary care patient of Mallory Steele, Mallory Kaufmann, MD. The CM team was consulted for assistance with chronic disease management and care coordination.   I reached out to Mallory Steele, by phone today.   Review of patient status, including review of consultants reports, relevant laboratory and other test results, and collaboration with appropriate care team members and the patient's provider was performed as part of comprehensive patient evaluation and provision of chronic care management services.   Social Determinants of Health: risk for depression; risk for tobacco use    Chronic Care Management from 06/15/2019 in [facility]  PHQ-9 Total Score  10     GAD 7 : Generalized Anxiety Score 06/15/2019  Nervous, Anxious, on Edge 1  Control/stop worrying 3  Worry too much - different things 1  Trouble relaxing 0  Restless 0  Easily annoyed or irritable 1  Afraid - awful might happen 0  Total GAD 7 Score 6  Anxiety Difficulty Somewhat difficult   Medications   New medications from outside sources are available for reconciliation   baclofen (LIORESAL) 10 MG tablet    clobetasol cream (TEMOVATE) 0.05 %    donepezil (ARICEPT) 10 MG tablet    DULoxetine (CYMBALTA) 60 MG capsule    ferrous sulfate 325 (65 FE) MG tablet    FOLIC ACID PO    meclizine (ANTIVERT) 25 MG tablet    methotrexate (RHEUMATREX) 2.5 MG tablet    METHOTREXATE PO    risperiDONE (RISPERDAL) 0.5 MG tablet    rOPINIRole (REQUIP) 0.25 MG tablet    traZODone (DESYREL) 50 MG tablet    vitamin B-12 (CYANOCOBALAMIN) 250 MCG tablet     Goals    . Client said she has financial challenges and it is hard to pay for incontinent supplies montylyi (pt-stated)     Current Barriers:  . Financial challenges  Clinical Social Work Clinical Goal(s):  . Client and LCSW will communicate in next 30 days to talk  about financial needs of client.  Interventions: . Talked with client/daughter about CCM program services. . Talked with client/daughter about CCM nursing support with RNCM . Talked with client about financial needs of client . Talked with client Mallory Steele about client's frequent use of incontinent supplies and difficulty in paying for these supplies    Talked with Mallory Steele about daily care needs of client  Talked with Mallory Steele about resources through [facility] for in home care support   Patient Self Care Activities:  . Takes medications as prescribed . Attends scheduled medical appointments  Plan:   Client to attend scheduled client medical appointments LCSW to call client or her daughter in next 3 weeks to talk with client or her Daughter about financial needs of client and client procurement of incontinent supplies Client to talk with Mallory Steele regarding nursing needs of client  Initial goal documentation      Client has her prescribed medications and is taking medications as prescribed. She said it continues to be difficult to pay for incontinent supplies.  She said she is using 2 or 3 packs a week of incontinent briefs. She said she has financial challenges.  She receives a set income from social security.  Mallory Steele, daughter of client, said Dr. Warrick Steele had written prescription script recently for incontinent supplies for client . Mallory Steele said she plans to pick up prescription this week  to use it to obtain incontinent supplies for client. Mallory Steele said client  is sleeping well.  Mallory Steele said client is eating well.   LCSW encouraged client to call RNCM to discuss nursing needs of client. Client did not mention any pain issues. Client sees dermatologist as scheduled. Client has no difficulty walking. Mallory Steele and LCSW talked about in home support services for client. LCSW gave Mallory Steele the name and number for [facility] in [place], [place]. Mallory Steele said she planned to call [facility] to talk  about occasional in home help for client. Mallory Steele said she occasionally needs a break from caregiving responsibility. She said she would call [facility] to talk about resources for in home care through that agency. Mallory Steele and Mallory Steele were appreciative of phone call from LCSW on 08/06/2019  Follow Up Plan: LCSW to call client or her daughter in the next 3 weeks to talk with client or her daughter about financial needs of client  Mallory Steele.Mallory Steele MSW, LCSW Licensed Clinical Social Worker [facility] Care [facility] [phone]

## 2019-08-06 NOTE — Patient Instructions (Signed)
Licensed Clinical Social Worker Visit Information  Goals we discussed today:  Goals    . Client said she has financial challenges and it is hard to pay for incontinent supplies montylyi (pt-stated)     Current Barriers:  . Financial challenges  Clinical Social Work Clinical Goal(s):  . Client and LCSW will communicate in next 30 days to talk about financial needs of client.  Interventions: . Talked with client/Betty about CCM program services. . Talked with client/Betty about CCM nursing support with RNCM . Talked with client / [name] about financial needs of client . Talked with client about her frequent use of incontinent supplies and difficulty in          paying for these supplies   Talked with [name] about [facility] and in home support services through that agency   Patient Self Care Activities:   . Takes medications as prescribed . Attends scheduled medical appointments  Plan:   Client to attend scheduled client medical appointments LCSW to call client or her daughter in next 3 weeks to talk with client or her Daughter about financial needs of client and client procurement of incontinent supplies Client to talk with [facility] regarding nursing needs of client  Initial goal documentation         Materials Provided: No  Follow Up Plan:LCSW to call client or her daughter in the next 3 weeks to talk with client or her daughter about the financial needs of client   The patient/daughter, Luanna Bloomingdale,  verbalized understanding of instructions provided today and declined a print copy of patient instruction materials.   [name].Laconda Basich MSW, LCSW Licensed Clinical Social Worker [facility] [phone]

## 2019-08-11 ENCOUNTER — Other Ambulatory Visit: Payer: Self-pay | Admitting: Family Medicine

## 2019-08-11 DIAGNOSIS — F331 Major depressive disorder, recurrent, moderate: Secondary | ICD-10-CM

## 2019-08-28 ENCOUNTER — Ambulatory Visit (INDEPENDENT_AMBULATORY_CARE_PROVIDER_SITE_OTHER): Payer: Medicare HMO | Admitting: Licensed Clinical Social Worker

## 2019-08-28 DIAGNOSIS — F0281 Dementia in other diseases classified elsewhere with behavioral disturbance: Secondary | ICD-10-CM | POA: Diagnosis not present

## 2019-08-28 DIAGNOSIS — G301 Alzheimer's disease with late onset: Secondary | ICD-10-CM

## 2019-08-28 DIAGNOSIS — F331 Major depressive disorder, recurrent, moderate: Secondary | ICD-10-CM

## 2019-08-28 DIAGNOSIS — D509 Iron deficiency anemia, unspecified: Secondary | ICD-10-CM

## 2019-08-28 DIAGNOSIS — F419 Anxiety disorder, unspecified: Secondary | ICD-10-CM

## 2019-08-28 NOTE — Chronic Care Management (AMB) (Signed)
  Care Management Note   Mallory Steele is a 85 y.o. year old female who is a primary care patient of Dettinger, Fransisca Kaufmann, MD. The CM team was consulted for assistance with chronic disease management and care coordination.   I reached out to [employer], daughter by phone today.     Review of patient status, including review of consultants reports, relevant laboratory and other test results, and collaboration with appropriate care team members and the patient's provider was performed as part of comprehensive patient evaluation and provision of chronic care management services.   Social Determinants of Health: risk of depression; risk of tobacco use    Chronic Care Management from 06/15/2019 in [facility]  PHQ-9 Total Score  10     GAD 7 : Generalized Anxiety Score 06/15/2019  Nervous, Anxious, on Edge 1  Control/stop worrying 3  Worry too much - different things 1  Trouble relaxing 0  Restless 0  Easily annoyed or irritable 1  Afraid - awful might happen 0  Total GAD 7 Score 6  Anxiety Difficulty Somewhat difficult   Medications   New medications from outside sources are available for reconciliation   baclofen (LIORESAL) 10 MG tablet    clobetasol cream (TEMOVATE) 0.05 %    donepezil (ARICEPT) 10 MG tablet    DULoxetine (CYMBALTA) 60 MG capsule    ferrous sulfate 325 (65 FE) MG tablet    FOLIC ACID PO    meclizine (ANTIVERT) 25 MG tablet    methotrexate (RHEUMATREX) 2.5 MG tablet    METHOTREXATE PO    risperiDONE (RISPERDAL) 0.5 MG tablet    rOPINIRole (REQUIP) 0.25 MG tablet    traZODone (DESYREL) 50 MG tablet    vitamin B-12 (CYANOCOBALAMIN) 250 MCG tablet     Goals    . Client said she has financial challenges and it is hard to pay for incontinent supplies montylyi (pt-stated)     Current Barriers:  . Financial challenges  Clinical Social Work Clinical Goal(s):  . Client and LCSW will communicate in next 30 days to talk about  financial needs of client.  Interventions: . Previously talked with client/Betty Chana Bode about CCM program services. . Talked previously  with client/Betty Chana Bode about CCM nursing support with RNCM . Talked previously with client/Betty Chana Bode about financial needs of client . Talked with [name] about client use of incontinent supplies and difficulty in          paying for these supplies    Talked with [name] about illness of Betty's daughter    Patient Self Care Activities:  . Takes medications as prescribed . Attends scheduled medical appointments  Plan:   Client to attend scheduled client medical appointments LCSW to call client or her daughter in next 3 weeks to talk with client or her Daughter about financial needs of client and client procurement of incontinent supplies Client to talk with [facility] regarding nursing needs of client  Initial goal documentation       Follow Up Plan: LCSW to call client or her daughter in next 3 weeks to talk with client or her daughter about the financial needs of client and about procurement of incontinent supplies for client  [name].Jarquis Walker MSW, LCSW Licensed Clinical Social Worker [facility] [phone]

## 2019-08-28 NOTE — Patient Instructions (Signed)
Licensed Clinical Social Worker Visit Information  Goals we discussed today:  Goals    . Client said she has financial challenges and it is hard to pay for incontinent supplies montylyi (pt-stated)     Current Barriers:  . Financial challenges  Clinical Social Work Clinical Goal(s):  . Client and LCSW will communicate in next 30 days to talk about financial needs of client.  Interventions: . Talked previously with client/Mallory Steele about CCM program services. . Talked previously with client/Mallory Steele about CCM nursing support with RNCM . Talked previously with client/Mallory Steele about financial needs of client . Talked with Mallory Steele about client's use of incontinent supplies and difficulty in          paying for these supplies  Talked with Mallory Steele about the health needs of Mallory's daughter  Patient Self Care Activities:  . Takes medications as prescribed . Attends scheduled medical appointments  Plan:   Client to attend scheduled client medical appointments LCSW to call client or her daughter in next 3 weeks to talk with client or her Daughter about financial needs of client and client procurement of incontinent supplies Client to talk with [facility] regarding nursing needs of client  Initial goal documentation         Materials Provided: No  Follow Up Plan: LCSW to call client or her daughter in next 3 weeks to talk with client or her daughter about financial needs of client and about client procurement of incontinent supplies  The patient Mallory Steele, daughter, verbalized understanding of instructions provided today and declined a print copy of patient instruction materials.   [name].Clotee Schlicker MSW, LCSW Licensed Clinical Social Worker [facility] [phone]

## 2019-09-04 ENCOUNTER — Ambulatory Visit: Payer: Medicare HMO

## 2019-09-07 ENCOUNTER — Other Ambulatory Visit: Payer: Self-pay | Admitting: Family Medicine

## 2019-09-07 DIAGNOSIS — F0391 Unspecified dementia with behavioral disturbance: Secondary | ICD-10-CM

## 2019-09-07 DIAGNOSIS — F03918 Unspecified dementia, unspecified severity, with other behavioral disturbance: Secondary | ICD-10-CM

## 2019-09-10 ENCOUNTER — Other Ambulatory Visit: Payer: Self-pay

## 2019-09-10 ENCOUNTER — Ambulatory Visit (INDEPENDENT_AMBULATORY_CARE_PROVIDER_SITE_OTHER): Payer: Medicare HMO | Admitting: Family Medicine

## 2019-09-10 ENCOUNTER — Encounter: Payer: Self-pay | Admitting: Family Medicine

## 2019-09-10 VITALS — BP 115/65 | HR 74 | Temp 99.0°F | Resp 20 | Ht 65.0 in | Wt 170.0 lb

## 2019-09-10 DIAGNOSIS — N3001 Acute cystitis with hematuria: Secondary | ICD-10-CM

## 2019-09-10 DIAGNOSIS — K219 Gastro-esophageal reflux disease without esophagitis: Secondary | ICD-10-CM

## 2019-09-10 DIAGNOSIS — R300 Dysuria: Secondary | ICD-10-CM

## 2019-09-10 DIAGNOSIS — J342 Deviated nasal septum: Secondary | ICD-10-CM | POA: Diagnosis not present

## 2019-09-10 LAB — URINALYSIS, COMPLETE
Bilirubin, UA: NEGATIVE
Glucose, UA: NEGATIVE
Ketones, UA: NEGATIVE
Leukocytes,UA: NEGATIVE
Nitrite, UA: NEGATIVE
RBC, UA: NEGATIVE
Specific Gravity, UA: >1.03 — ABNORMAL HIGH (ref 1.005–1.030)
Urobilinogen, Ur: 0.2 mg/dL (ref 0.2–1.0)
pH, UA: 5 (ref 5.0–7.5)

## 2019-09-10 LAB — MICROSCOPIC EXAMINATION
RBC, Urine: NONE SEEN /hpf (ref 0–2)
Renal Epithel, UA: NONE SEEN /hpf

## 2019-09-10 MED ORDER — CIPROFLOXACIN HCL 500 MG PO TABS: 500.0000 mg | ORAL_TABLET | Freq: Two times a day (BID) | ORAL | 0 refills | Status: AC

## 2019-09-10 MED ORDER — OMEPRAZOLE 20 MG PO CPDR: 20.0000 mg | DELAYED_RELEASE_CAPSULE | Freq: Every day | ORAL | 3 refills | Status: DC

## 2019-09-10 NOTE — Patient Instructions (Signed)
Urinary Tract Infection, Adult A urinary tract infection (UTI) is an infection of any part of the urinary tract. The urinary tract includes:  The kidneys.  The ureters.  The bladder.  The urethra. These organs make, store, and get rid of pee (urine) in the body. What are the causes? This is caused by germs (bacteria) in your genital area. These germs grow and cause swelling (inflammation) of your urinary tract. What increases the risk? You are more likely to develop this condition if:  You have a small, thin tube (catheter) to drain pee.  You cannot control when you pee or poop (incontinence).  You are female, and: ? You use these methods to prevent pregnancy: ? A medicine that kills sperm (spermicide). ? A device that blocks sperm (diaphragm). ? You have low levels of a female hormone (estrogen). ? You are pregnant.  You have genes that add to your risk.  You are sexually active.  You take antibiotic medicines.  You have trouble peeing because of: ? A prostate that is bigger than normal, if you are female. ? A blockage in the part of your body that drains pee from the bladder (urethra). ? A kidney stone. ? A nerve condition that affects your bladder (neurogenic bladder). ? Not getting enough to drink. ? Not peeing often enough.  You have other conditions, such as: ? Diabetes. ? A weak disease-fighting system (immune system). ? Sickle cell disease. ? Gout. ? Injury of the spine. What are the signs or symptoms? Symptoms of this condition include:  Needing to pee right away (urgently).  Peeing often.  Peeing small amounts often.  Pain or burning when peeing.  Blood in the pee.  Pee that smells bad or not like normal.  Trouble peeing.  Pee that is cloudy.  Fluid coming from the vagina, if you are female.  Pain in the belly or lower back. Other symptoms include:  Throwing up (vomiting).  No urge to eat.  Feeling mixed up (confused).  Being tired  and grouchy (irritable).  A fever.  Watery poop (diarrhea). How is this treated? This condition may be treated with:  Antibiotic medicine.  Other medicines.  Drinking enough water. Follow these instructions at home:  Medicines  Take over-the-counter and prescription medicines only as told by your doctor.  If you were prescribed an antibiotic medicine, take it as told by your doctor. Do not stop taking it even if you start to feel better. General instructions  Make sure you: ? Pee until your bladder is empty. ? Do not hold pee for a long time. ? Empty your bladder after sex. ? Wipe from front to back after pooping if you are a female. Use each tissue one time when you wipe.  Drink enough fluid to keep your pee pale yellow.  Keep all follow-up visits as told by your doctor. This is important. Contact a doctor if:  You do not get better after 1-2 days.  Your symptoms go away and then come back. Get help right away if:  You have very bad back pain.  You have very bad pain in your lower belly.  You have a fever.  You are sick to your stomach (nauseous).  You are throwing up. Summary  A urinary tract infection (UTI) is an infection of any part of the urinary tract.  This condition is caused by germs in your genital area.  There are many risk factors for a UTI. These include having a small, thin   tube to drain pee and not being able to control when you pee or poop.  Treatment includes antibiotic medicines for germs.  Drink enough fluid to keep your pee pale yellow. This information is not intended to replace advice given to you by your health care provider. Make sure you discuss any questions you have with your health care provider. Document Released: 04/26/2008 Document Revised: 10/26/2018 Document Reviewed: 05/18/2018 Elsevier Patient Education  2020 [employer]  

## 2019-09-11 LAB — URINE CULTURE

## 2019-09-11 NOTE — Progress Notes (Signed)
Subjective:  Patient ID: Mallory Steele, female    DOB: 10/06/1932, 85 y.o.   MRN: [id number]  Patient Care Team: Dettinger, Fransisca Kaufmann, MD as PCP - General (Family Medicine) Shea Evans, [name], LCSW as Social Worker (Licensed Clinical Social Worker)   Chief Complaint:  Urinary Tract Infection   HPI: Mallory Steele is a 85 y.o. female presenting on 09/10/2019 for Urinary Tract Infection   Pt presents today with complaints of dysuria with frequency and urgency. States this started 4 days ago and is getting worse.  She also reports ongoing trouble breathing out of her nose. State this started after she broke her nose due to a fall in 2017. Pt states she has had continued issues with breathing out of her right nare. She denies new injury.   She states she also has an irritated sensation in her throat that causes her to cough all of the time. States she used to have issues with reflux. Reports this resolved after she had GB surgery. She denies sore throat, water brash, hemoptysis, melena, hematochezia, voice change, or dysphagia.   Urinary Tract Infection  This is a new problem. The current episode started in the past 7 days. The problem occurs every urination. The problem has been unchanged. The quality of the pain is described as burning. The pain is at a severity of 3/10. The pain is mild. There has been no fever. She is not sexually active. There is no history of pyelonephritis. Associated symptoms include frequency and urgency. Pertinent negatives include no chills, discharge, flank pain, hematuria, hesitancy, nausea, possible pregnancy, sweats or vomiting. She has tried nothing for the symptoms.     Relevant past medical, surgical, family, and social history reviewed and updated as indicated.  Allergies and medications reviewed and updated. Date reviewed: Chart in Epic.   Past Medical History:  Diagnosis Date  . Anxiety   . Dementia [facility])     Past Surgical History:  Procedure  Laterality Date  . APPENDECTOMY  1947  . COLON SURGERY  [phone]  . JOINT REPLACEMENT     knee replaced    Social History   Socioeconomic History  . Marital status: Widowed    Spouse name: Not on file  . Number of children: Not on file  . Years of education: Not on file  . Highest education level: Not on file  Occupational History  . Not on file  Social Needs  . Financial resource strain: Not on file  . Food insecurity    Worry: Not on file    Inability: Not on file  . Transportation needs    Medical: Not on file    Non-medical: Not on file  Tobacco Use  . Smoking status: Former Smoker    Packs/day: 0.25    Years: 3.00    Pack years: 0.75    Quit date: 09/22/1985    Years since quitting: 33.9  . Smokeless tobacco: Never Used  Substance and Sexual Activity  . Alcohol use: Yes    Alcohol/week: 1.0 standard drinks    Types: 1 Glasses of wine per week  . Drug use: No  . Sexual activity: Never  Lifestyle  . Physical activity    Days per week: Not on file    Minutes per session: Not on file  . Stress: Not on file  Relationships  . Social Herbalist on phone: Not on file    Gets together: Not on file  Attends religious service: Not on file    Active member of club or organization: Not on file    Attends meetings of clubs or organizations: Not on file    Relationship status: Not on file  . Intimate partner violence    Fear of current or ex partner: Not on file    Emotionally abused: Not on file    Physically abused: Not on file    Forced sexual activity: Not on file  Other Topics Concern  . Not on file  Social History Narrative  . Not on file    Outpatient Encounter Medications as of 09/10/2019  Medication Sig  . clobetasol cream (TEMOVATE) [id number] % Apply 1 application topically 2 (two) times daily.  Marland Kitchen donepezil (ARICEPT) 10 MG tablet TAKE 1 TABLET BY MOUTH EVERY DAY  . DULoxetine (CYMBALTA) 60 MG capsule TAKE 2 CAPSULES (120 MG TOTAL) BY  MOUTH DAILY. (NEEDS TO BE SEEN BEFORE NEXT REFILL)  . ferrous sulfate 325 (65 FE) MG tablet TAKE 1 TABLET BY MOUTH EVERY DAY WITH BREAKFAST  . FOLIC ACID PO Take by mouth.  . meclizine (ANTIVERT) 25 MG tablet TAKE 1 TABLET (25 MG TOTAL) BY MOUTH 3 (THREE) TIMES DAILY AS NEEDED FOR DIZZINESS.  Marland Kitchen risperiDONE (RISPERDAL) 0.5 MG tablet Take 1 tablet (0.5 mg total) by mouth 2 (two) times daily as needed. (Needs to be seen before next refill)  . rOPINIRole (REQUIP) 0.25 MG tablet TAKE 1 TABLET (0.25 MG TOTAL) BY MOUTH 3 (THREE) TIMES DAILY.  . traZODone (DESYREL) 50 MG tablet TAKE 1 TO 2 TABLETS BY MOUTH AT BEDTIME AS NEEDED FOR SLEEP (NEEDS TO BE SEEN BEFORE NEXT REFILL)  . vitamin B-12 (CYANOCOBALAMIN) 250 MCG tablet TAKE 1 TABLET BY MOUTH EVERY DAY  . ciprofloxacin (CIPRO) 500 MG tablet Take 1 tablet (500 mg total) by mouth 2 (two) times daily for 5 days.  Marland Kitchen omeprazole (PRILOSEC) 20 MG capsule Take 1 capsule (20 mg total) by mouth daily.  . [DISCONTINUED] baclofen (LIORESAL) 10 MG tablet TAKE 1 TABLET BY MOUTH THREE TIMES A DAY  . [DISCONTINUED] methotrexate (RHEUMATREX) 2.5 MG tablet   . [DISCONTINUED] METHOTREXATE PO Take by mouth.   No facility-administered encounter medications on file as of 09/10/2019.     No Known Allergies  Review of Systems  Constitutional: Negative for activity change, appetite change, chills, diaphoresis, fatigue, fever and unexpected weight change.  HENT: Negative for sore throat, tinnitus, trouble swallowing and voice change.   Eyes: Negative.  Negative for photophobia and visual disturbance.  Respiratory: Positive for cough. Negative for apnea, choking, chest tightness, shortness of breath, wheezing and stridor.   Cardiovascular: Negative for chest pain, palpitations and leg swelling.  Gastrointestinal: Negative for abdominal distention, abdominal pain, anal bleeding, blood in stool, constipation, diarrhea, nausea, rectal pain and vomiting.  Endocrine: Negative.   Negative for polydipsia, polyphagia and polyuria.  Genitourinary: Positive for dysuria, frequency and urgency. Negative for decreased urine volume, difficulty urinating, dyspareunia, flank pain, hematuria, hesitancy, pelvic pain, vaginal bleeding, vaginal discharge and vaginal pain.  Musculoskeletal: Negative for arthralgias, back pain and myalgias.  Skin: Negative.  Negative for color change and pallor.  Allergic/Immunologic: Negative.   Neurological: Negative for dizziness, tremors, seizures, syncope, facial asymmetry, speech difficulty, weakness, light-headedness, numbness and headaches.  Hematological: Negative.   Psychiatric/Behavioral: Positive for confusion (baseline). Negative for hallucinations, sleep disturbance and suicidal ideas.  All other systems reviewed and are negative.       Objective:  BP 115/65  Pulse 74   Temp 99 F (37.2 C)   Resp 20   Ht 5\' 5"  (1.651 m)   Wt 170 lb (77.1 kg)   SpO2 99%   BMI 28.29 kg/m    Wt Readings from Last 3 Encounters:  09/10/19 170 lb (77.1 kg)  12/22/18 170 lb (77.1 kg)  09/18/18 169 lb 15.7 oz (77.1 kg)    Physical Exam Vitals signs and nursing note reviewed.  Constitutional:      General: She is not in acute distress.    Appearance: Normal appearance. She is well-developed and well-groomed. She is not ill-appearing, toxic-appearing or diaphoretic.  HENT:     Head: Normocephalic and atraumatic.     Jaw: There is normal jaw occlusion.     Right Ear: Hearing, tympanic membrane, ear canal and external ear normal.     Left Ear: Hearing, tympanic membrane, ear canal and external ear normal.     Nose: Septal deviation present. No laceration, nasal tenderness, mucosal edema, congestion or rhinorrhea.     Mouth/Throat:     Lips: Pink.     Mouth: Mucous membranes are moist.     Pharynx: Oropharynx is clear. Uvula midline.  Eyes:     General: Lids are normal.     Extraocular Movements: Extraocular movements intact.      Conjunctiva/sclera: Conjunctivae normal.     Pupils: Pupils are equal, round, and reactive to light.  Neck:     Musculoskeletal: Normal range of motion and neck supple.     Thyroid: No thyroid mass, thyromegaly or thyroid tenderness.     Vascular: No carotid bruit or JVD.     Trachea: Trachea and phonation normal.  Cardiovascular:     Rate and Rhythm: Normal rate and regular rhythm.     Chest Wall: PMI is not displaced.     Pulses: Normal pulses.     Heart sounds: Normal heart sounds. No murmur. No friction rub. No gallop.   Pulmonary:     Effort: Pulmonary effort is normal. No respiratory distress.     Breath sounds: Normal breath sounds. No wheezing.  Abdominal:     General: Bowel sounds are normal. There is no distension or abdominal bruit.     Palpations: Abdomen is soft. There is no hepatomegaly or splenomegaly.     Tenderness: There is no abdominal tenderness. There is no right CVA tenderness or left CVA tenderness.     Hernia: No hernia is present.  Musculoskeletal: Normal range of motion.     Right lower leg: No edema.     Left lower leg: No edema.  Lymphadenopathy:     Cervical: No cervical adenopathy.  Skin:    General: Skin is warm and dry.     Capillary Refill: Capillary refill takes less than 2 seconds.     Coloration: Skin is not cyanotic, jaundiced or pale.     Findings: No rash.  Neurological:     General: No focal deficit present.     Mental Status: She is alert. Mental status is at baseline.     Cranial Nerves: Cranial nerves are intact.     Sensory: Sensation is intact.     Motor: Motor function is intact. No weakness.     Coordination: Coordination is intact.     Gait: Gait is intact.     Deep Tendon Reflexes: Reflexes are normal and symmetric.  Psychiatric:        Attention and Perception: Attention and perception normal.  Mood and Affect: Mood and affect normal.        Speech: Speech normal.        Behavior: Behavior normal. Behavior is  cooperative.        Thought Content: Thought content normal.        Cognition and Memory: Cognition and memory normal.        Judgment: Judgment normal.     Results for orders placed or performed in visit on 09/10/19  Microscopic Examination   URINE  Result Value Ref Range   WBC, UA 0-5 0 - 5 /hpf   RBC None seen 0 - 2 /hpf   Epithelial Cells (non renal) 0-10 0 - 10 /hpf   Renal Epithel, UA None seen None seen /hpf   Mucus, UA Present Not Estab.   Bacteria, UA Few None seen/Few  Urinalysis, Complete  Result Value Ref Range   Specific Gravity, UA >1.030 (H) 1.005 - 1.030   pH, UA 5.0 5.0 - 7.5   Color, UA Yellow Yellow   Appearance Ur Clear Clear   Leukocytes,UA Negative Negative   Protein,UA 1+ (A) Negative/Trace   Glucose, UA Negative Negative   Ketones, UA Negative Negative   RBC, UA Negative Negative   Bilirubin, UA Negative Negative   Urobilinogen, Ur 0.2 0.2 - 1.0 mg/dL   Nitrite, UA Negative Negative   Microscopic Examination See below:        Pertinent labs & imaging results that were available during my care of the patient were reviewed by me and considered in my medical decision making.  Assessment & Plan:  [place] was seen today for urinary tract infection.  Diagnoses and all orders for this visit:  Dysuria -     Urine Culture -     Urinalysis, Complete -     Microscopic Examination  Acute cystitis with hematuria Urinalysis indicates acute cystitis with hematuria. Will initiate below. Culture pending, will change therapy if warranted. Symptomatic care discussed. Return precautions discussed in detail.  -     ciprofloxacin (CIPRO) 500 MG tablet; Take 1 tablet (500 mg total) by mouth 2 (two) times daily for 5 days.  GERD without esophagitis Cough and throat irritation. Will initiate below to see if beneficial. Report any new, worsening, or persistent symptoms.  -     omeprazole (PRILOSEC) 20 MG capsule; Take 1 capsule (20 mg total) by mouth daily.  Deviated  septum Ongoing issues since nasal fracture in 2017, will refer to ENT for evaluation.  -     Ambulatory referral to ENT     Continue all other maintenance medications.  Follow up plan: Return in about 2 weeks (around 09/24/2019), or if symptoms worsen or fail to improve, for recheck urine.  Continue healthy lifestyle choices, including diet (rich in fruits, vegetables, and lean proteins, and low in salt and simple carbohydrates) and exercise (at least 30 minutes of moderate physical activity daily).  Educational handout given for UTI  The above assessment and management plan was discussed with the patient. The patient verbalized understanding of and has agreed to the management plan. Patient is aware to call the clinic if they develop any new symptoms or if symptoms persist or worsen. Patient is aware when to return to the clinic for a follow-up visit. Patient educated on when it is appropriate to go to the emergency department.   Monia Pouch, FNP-C [facility] [phone]

## 2019-09-14 ENCOUNTER — Other Ambulatory Visit: Payer: Self-pay | Admitting: Family Medicine

## 2019-09-14 DIAGNOSIS — F331 Major depressive disorder, recurrent, moderate: Secondary | ICD-10-CM

## 2019-09-16 ENCOUNTER — Other Ambulatory Visit: Payer: Self-pay | Admitting: Family Medicine

## 2019-09-16 DIAGNOSIS — F331 Major depressive disorder, recurrent, moderate: Secondary | ICD-10-CM

## 2019-09-18 ENCOUNTER — Ambulatory Visit: Payer: Self-pay | Admitting: Licensed Clinical Social Worker

## 2019-09-18 ENCOUNTER — Other Ambulatory Visit: Payer: Self-pay | Admitting: Family Medicine

## 2019-09-18 DIAGNOSIS — F331 Major depressive disorder, recurrent, moderate: Secondary | ICD-10-CM

## 2019-09-18 DIAGNOSIS — F02818 Dementia in other diseases classified elsewhere, unspecified severity, with other behavioral disturbance: Secondary | ICD-10-CM

## 2019-09-18 DIAGNOSIS — E871 Hypo-osmolality and hyponatremia: Secondary | ICD-10-CM

## 2019-09-18 DIAGNOSIS — F419 Anxiety disorder, unspecified: Secondary | ICD-10-CM

## 2019-09-18 DIAGNOSIS — D509 Iron deficiency anemia, unspecified: Secondary | ICD-10-CM

## 2019-09-18 DIAGNOSIS — F0281 Dementia in other diseases classified elsewhere with behavioral disturbance: Secondary | ICD-10-CM

## 2019-09-18 NOTE — Patient Instructions (Addendum)
Licensed Clinical Social Worker Visit Information  Goals we discussed today:  Goals    . Client said she has financial challenges and it is hard to pay for incontinent supplies montylyi (pt-stated)     Current Barriers:  . Financial challenges  Clinical Social Work Clinical Goal(s):  . Client and LCSW will communicate in next 30 days to talk about financial needs of client.  Interventions: . Talked previously with client about CCM program services. . Talked previously with client about [place] with RNCM . Talked with [name] about financial needs of client . Talked with [name] about client's use of incontinent supplies and difficulty in          paying for these supplies  Talked with [name] about medication procurement for client  Patient Self Care Activities:  . Takes medications as prescribed . Attends scheduled medical appointments  Plan:   Client to attend scheduled client medical appointments LCSW to call client or her daughter in next 3 weeks to talk with client or her Daughter about financial needs of client and client procurement of incontinent supplies Client to talk with [facility] regarding nursing needs of client  Initial goal documentation         Materials Provided: No  Follow Up Plan:  LCSW to call client or her daughter in next 3 weeks to talk with client or her daughter about financial needs of client and about client procurement of incontinent supplie for client  The patient/Betty Filar, daughter,  verbalized understanding of instructions provided today and declined a print copy of patient instruction materials.   [name].Aster Eckrich MSW, LCSW Licensed Clinical Social Worker [facility] [phone]

## 2019-09-18 NOTE — Chronic Care Management (AMB) (Signed)
  Care Management Note   Mallory Steele is a 85 y.o. year old female who is a primary care patient of Dettinger, Fransisca Kaufmann, MD. The CM team was consulted for assistance with chronic disease management and care coordination.   I reached out to [place] phone today.   Review of patient status, including review of consultants reports, relevant laboratory and other test results, and collaboration with appropriate care team members and the patient's provider was performed as part of comprehensive patient evaluation and provision of chronic care management services.   Social Determination of Health : risk of tobacco use; risk of financial strain    Chronic Care Management from 06/15/2019 in [place]  PHQ-9 Total Score  10     GAD 7 : Generalized Anxiety Score 06/15/2019  Nervous, Anxious, on Edge 1  Control/stop worrying 3  Worry too much - different things 1  Trouble relaxing 0  Restless 0  Easily annoyed or irritable 1  Afraid - awful might happen 0  Total GAD 7 Score 6  Anxiety Difficulty Somewhat difficult    Medications    clobetasol cream (TEMOVATE) 0.05 %    donepezil (ARICEPT) 10 MG tablet    DULoxetine (CYMBALTA) 60 MG capsule    ferrous sulfate 325 (65 FE) MG tablet    FOLIC ACID PO    meclizine (ANTIVERT) 25 MG tablet    omeprazole (PRILOSEC) 20 MG capsule    risperiDONE (RISPERDAL) 0.5 MG tablet    rOPINIRole (REQUIP) 0.25 MG tablet    traZODone (DESYREL) 50 MG tablet    vitamin B-12 (CYANOCOBALAMIN) 250 MCG tablet     Goals    . Client said she has financial challenges and it is hard to pay for incontinent supplies monthly (pt-stated)     Current Barriers:  . Financial challenges  Clinical Social Work Clinical Goal(s):  . Client and LCSW will communicate in next 30 days to talk about financial needs of client.  Interventions: . Talked previously with client about CCM program services. . Talked previously  with client about [place] with RNCM . Talked with [name] about financial needs of client . Talked with [name]  about client's use of incontinent supplies and difficulty in          paying for these supplies    Talked with [name] about medication procurement for client  Patient Self Care Activities:  . Takes medications as prescribed . Attends scheduled medical appointments  Plan:   Client to attend scheduled client medical appointments LCSW to call client or her daughter in next 3 weeks to talk with client or her Daughter about financial needs of client and client procurement of incontinent supplies Client to talk with [facility] regarding nursing needs of client  Initial goal documentation      Follow Up Plan: LCSW to call client or her daughter in next 3 weeks to talk with client or her daughter about financial needs of client  and about client procurement of incontinent supplies  [name].Denzil Mceachron MSW, LCSW Licensed Clinical Social Worker [facility] [phone]

## 2019-09-26 ENCOUNTER — Ambulatory Visit: Payer: Medicare HMO

## 2019-10-01 ENCOUNTER — Other Ambulatory Visit: Payer: Self-pay | Admitting: Family Medicine

## 2019-10-09 ENCOUNTER — Ambulatory Visit (INDEPENDENT_AMBULATORY_CARE_PROVIDER_SITE_OTHER): Payer: Medicare HMO | Admitting: Licensed Clinical Social Worker

## 2019-10-09 DIAGNOSIS — D509 Iron deficiency anemia, unspecified: Secondary | ICD-10-CM | POA: Diagnosis not present

## 2019-10-09 DIAGNOSIS — G301 Alzheimer's disease with late onset: Secondary | ICD-10-CM | POA: Diagnosis not present

## 2019-10-09 DIAGNOSIS — F0281 Dementia in other diseases classified elsewhere with behavioral disturbance: Secondary | ICD-10-CM | POA: Diagnosis not present

## 2019-10-09 DIAGNOSIS — E871 Hypo-osmolality and hyponatremia: Secondary | ICD-10-CM

## 2019-10-09 DIAGNOSIS — F331 Major depressive disorder, recurrent, moderate: Secondary | ICD-10-CM | POA: Diagnosis not present

## 2019-10-09 DIAGNOSIS — F419 Anxiety disorder, unspecified: Secondary | ICD-10-CM

## 2019-10-09 NOTE — Chronic Care Management (AMB) (Signed)
  Care Management Note   Mallory Steele is a 85 y.o. year old female who is a primary care patient of Dettinger, Fransisca Kaufmann, MD. The CM team was consulted for assistance with chronic disease management and care coordination.   I reached out to Mallory Steele by phone today.   Review of patient status, including review of consultants reports, relevant laboratory and other test results, and collaboration with appropriate care team members and the patient's provider was performed as part of comprehensive patient evaluation and provision of chronic care management services.   Social determinants of health: risk of tobacco use; risk of social isolation    Chronic Care Management from 06/15/2019 in [place]  PHQ-9 Total Score  10     GAD 7 : Generalized Anxiety Score 06/15/2019  Nervous, Anxious, on Edge 1  Control/stop worrying 3  Worry too much - different things 1  Trouble relaxing 0  Restless 0  Easily annoyed or irritable 1  Afraid - awful might happen 0  Total GAD 7 Score 6  Anxiety Difficulty Somewhat difficult   Medications    clobetasol cream (TEMOVATE) 0.05 %    donepezil (ARICEPT) 10 MG tablet    DULoxetine (CYMBALTA) 60 MG capsule    ferrous sulfate 325 (65 FE) MG tablet    FOLIC ACID PO    meclizine (ANTIVERT) 25 MG tablet    omeprazole (PRILOSEC) 20 MG capsule    risperiDONE (RISPERDAL) 0.5 MG tablet    rOPINIRole (REQUIP) 0.25 MG tablet    traZODone (DESYREL) 50 MG tablet    vitamin B-12 (CYANOCOBALAMIN) 250 MCG tablet    Goals    . Client said she has financial challenges and it is hard to pay for incontinent supplies montylyi (pt-stated)     Current Barriers:  . Financial challenges  Clinical Social Work Clinical Goal(s):  . Client and LCSW will communicate in next 30 days to talk about financial needs of client.  Interventions: . Previously talked with client about financial needs of client . Previously talked with client/Mallory  Mallory Steele, daughter about client's frequent use of incontinent supplies and difficulty in          paying for these supplies    Talked with Mallory Steele about pain issues of client     Talked with Mallory Steele about medication procurement for client  Previously encouraged that Mallory Steele contact RNCM as needed to discuss nursing needs of client   Patient Self Care Activities:  . Takes medications as prescribed . Attends scheduled medical appointments  Plan:   Client to attend scheduled client medical appointments LCSW to call client or her daughter in next 3 weeks to talk with client or her Daughter about financial needs of client and client procurement of incontinent supplies Client to talk with [facility] regarding nursing needs of client  Initial goal documentation       Follow Up Plan: LCSW to call client or her daughter in next 3 weeks to talk with client or daughter about financial needs of client and about client procurement of incontinent supplies  [name].Gilman Olazabal MSW, LCSW Licensed Clinical Social Worker [facility] [phone]

## 2019-10-09 NOTE — Patient Instructions (Addendum)
Licensed Clinical Social Worker Visit Information  Goals we discussed today:  Goals    . Client said she has financial challenges and it is hard to pay for incontinent supplies montylyi (pt-stated)     Current Barriers:  . Financial challenges  Clinical Social Work Clinical Goal(s):  . Client and LCSW will communicate in next 30 days to talk about financial needs of client.  Interventions:   Previously talked with client about financial needs of client  Previously talked with client/Mallory Steele, daughter about client's frequent use of incontinent supplies and difficulty in          paying for these supplies    Talked with [name] about pain issues of client     Talked with [name] about medication procurement for client  Previously encouraged that [name] contact RNCM as needed to discuss nursing needs of client  Patient Self Care Activities:  . Takes medications as prescribed . Attends scheduled medical appointments  Plan:   Client to attend scheduled client medical appointments LCSW to call client or her daughter in next 3 weeks to talk with client or her Daughter about financial needs of client and client procurement of incontinent supplies Client to talk with [facility] regarding nursing needs of client  Initial goal documentation        Materials Provided: No  Follow Up Plan: LCSW to call client or her daughter in the next 3 weeks to talk with client or her daughter about the financial needs of client and about client procurement of incontinent supplies needed.  The patient/Mallory Steele, daughter, verbalized understanding of instructions provided today and declined a print copy of patient instruction materials.   [name].Eliyohu Class MSW, LCSW Licensed Clinical Social Worker [facility] [phone]

## 2019-10-15 ENCOUNTER — Other Ambulatory Visit: Payer: Self-pay | Admitting: Family Medicine

## 2019-10-15 DIAGNOSIS — F331 Major depressive disorder, recurrent, moderate: Secondary | ICD-10-CM

## 2019-10-25 ENCOUNTER — Other Ambulatory Visit: Payer: Self-pay | Admitting: Family Medicine

## 2019-10-30 ENCOUNTER — Ambulatory Visit (INDEPENDENT_AMBULATORY_CARE_PROVIDER_SITE_OTHER): Payer: Medicare HMO | Admitting: Family Medicine

## 2019-10-30 ENCOUNTER — Encounter: Payer: Self-pay | Admitting: Family Medicine

## 2019-10-30 DIAGNOSIS — F331 Major depressive disorder, recurrent, moderate: Secondary | ICD-10-CM | POA: Diagnosis not present

## 2019-10-30 DIAGNOSIS — F0281 Dementia in other diseases classified elsewhere with behavioral disturbance: Secondary | ICD-10-CM | POA: Diagnosis not present

## 2019-10-30 DIAGNOSIS — G301 Alzheimer's disease with late onset: Secondary | ICD-10-CM

## 2019-10-30 DIAGNOSIS — D509 Iron deficiency anemia, unspecified: Secondary | ICD-10-CM

## 2019-10-30 DIAGNOSIS — R252 Cramp and spasm: Secondary | ICD-10-CM

## 2019-10-30 DIAGNOSIS — F419 Anxiety disorder, unspecified: Secondary | ICD-10-CM | POA: Diagnosis not present

## 2019-10-30 DIAGNOSIS — K219 Gastro-esophageal reflux disease without esophagitis: Secondary | ICD-10-CM

## 2019-10-30 DIAGNOSIS — F03918 Unspecified dementia, unspecified severity, with other behavioral disturbance: Secondary | ICD-10-CM

## 2019-10-30 DIAGNOSIS — F0391 Unspecified dementia with behavioral disturbance: Secondary | ICD-10-CM

## 2019-10-30 DIAGNOSIS — F02818 Dementia in other diseases classified elsewhere, unspecified severity, with other behavioral disturbance: Secondary | ICD-10-CM

## 2019-10-30 MED ORDER — ROPINIROLE HCL 0.25 MG PO TABS: 0.2500 mg | ORAL_TABLET | Freq: Three times a day (TID) | ORAL | 3 refills | Status: DC

## 2019-10-30 MED ORDER — FERROUS SULFATE 325 (65 FE) MG PO TABS: 325.0000 mg | ORAL_TABLET | Freq: Every day | ORAL | 3 refills | Status: DC

## 2019-10-30 MED ORDER — DONEPEZIL HCL 10 MG PO TABS: 10.0000 mg | ORAL_TABLET | Freq: Every day | ORAL | 3 refills | Status: DC

## 2019-10-30 MED ORDER — RISPERIDONE 0.5 MG PO TABS: 0.5000 mg | ORAL_TABLET | Freq: Two times a day (BID) | ORAL | 3 refills | Status: DC

## 2019-10-30 MED ORDER — OMEPRAZOLE 20 MG PO CPDR: 20.0000 mg | DELAYED_RELEASE_CAPSULE | Freq: Every day | ORAL | 3 refills | Status: DC

## 2019-10-30 MED ORDER — DULOXETINE HCL 60 MG PO CPEP: 120.0000 mg | ORAL_CAPSULE | Freq: Every day | ORAL | 3 refills | Status: DC

## 2019-10-30 MED ORDER — TRAZODONE HCL 50 MG PO TABS: 50.0000 mg | ORAL_TABLET | Freq: Every evening | ORAL | 3 refills | Status: DC | PRN

## 2019-10-30 NOTE — Progress Notes (Signed)
Virtual Visit via telephone Note  I connected with Mallory Steele on 10/30/19 at 0855 by telephone and verified that I am speaking with the correct person using two identifiers. Mallory Steele is currently located at home and daughter are currently with her during visit. The provider, Fransisca Kaufmann Abagale Boulos, MD is located in their office at time of visit.  Call ended at 0905  I discussed the limitations, risks, security and privacy concerns of performing an evaluation and management service by telephone and the availability of in person appointments. I also discussed with the patient that there may be a patient responsible charge related to this service. The patient expressed understanding and agreed to proceed.   History and Present Illness: GERD Patient is currently on omeprazole.  She denies any major symptoms or abdominal pain or belching or burping. She denies any blood in her stool or lightheadedness or dizziness.  Insomnia and anxiety Patient is calling in for refills on medication and is currently risperidone and cymbalta and donepizil and requip and trazodone.  Patient is feeling like she is doing very well on current medications and family is there to discuss as well and says that her memory is doing okay.  They deny her having any major mood swings or anxiety or stress problems.  No diagnosis found.  Outpatient Encounter Medications as of 10/30/2019  Medication Sig  . clobetasol cream (TEMOVATE) [id number] % Apply 1 application topically 2 (two) times daily.  Marland Kitchen donepezil (ARICEPT) 10 MG tablet TAKE 1 TABLET BY MOUTH EVERY DAY  . DULoxetine (CYMBALTA) 60 MG capsule TAKE 2 CAPSULES (120 MG TOTAL) BY MOUTH DAILY. (NEEDS TO BE SEEN BEFORE NEXT REFILL)  . ferrous sulfate 325 (65 FE) MG tablet TAKE 1 TABLET BY MOUTH EVERY DAY WITH BREAKFAST  . FOLIC ACID PO Take by mouth.  . meclizine (ANTIVERT) 25 MG tablet TAKE 1 TABLET (25 MG TOTAL) BY MOUTH 3 (THREE) TIMES DAILY AS NEEDED FOR DIZZINESS.   Marland Kitchen omeprazole (PRILOSEC) 20 MG capsule Take 1 capsule (20 mg total) by mouth daily.  . risperiDONE (RISPERDAL) 0.5 MG tablet TAKE 1 TABLET BY MOUTH 2 TIMES DAILY AS NEEDED. (NEEDS TO BE SEEN BEFORE NEXT REFILL)  . rOPINIRole (REQUIP) 0.25 MG tablet TAKE 1 TABLET (0.25 MG TOTAL) BY MOUTH 3 (THREE) TIMES DAILY.  . traZODone (DESYREL) 50 MG tablet TAKE 1 TO 2 TABLETS BY MOUTH AT BEDTIME AS NEEDED FOR SLEEP (NEEDS TO BE SEEN BEFORE NEXT REFILL)  . vitamin B-12 (CYANOCOBALAMIN) 250 MCG tablet TAKE 1 TABLET BY MOUTH EVERY DAY   No facility-administered encounter medications on file as of 10/30/2019.     Review of Systems  Constitutional: Negative for chills and fever.  Eyes: Negative for visual disturbance.  Respiratory: Negative for chest tightness and shortness of breath.   Cardiovascular: Negative for chest pain and leg swelling.  Musculoskeletal: Negative for back pain and gait problem.  Skin: Negative for rash.  Neurological: Negative for light-headedness and headaches.  Psychiatric/Behavioral: Negative for agitation, behavioral problems, decreased concentration, dysphoric mood, self-injury, sleep disturbance and suicidal ideas. The patient is not nervous/anxious.   All other systems reviewed and are negative.   Observations/Objective: Patient sounds comfortable and in no acute distress  Assessment and Plan: Problem List Items Addressed This Visit      Digestive   GERD without esophagitis   Relevant Medications   omeprazole (PRILOSEC) 20 MG capsule     Nervous and Auditory   Dementia ([place])   Relevant  Medications   risperiDONE (RISPERDAL) 0.5 MG tablet   DULoxetine (CYMBALTA) 60 MG capsule   rOPINIRole (REQUIP) 0.25 MG tablet   traZODone (DESYREL) 50 MG tablet   donepezil (ARICEPT) 10 MG tablet     Other   Depression   Relevant Medications   risperiDONE (RISPERDAL) 0.5 MG tablet   DULoxetine (CYMBALTA) 60 MG capsule   traZODone (DESYREL) 50 MG tablet   Anxiety - Primary    Relevant Medications   risperiDONE (RISPERDAL) 0.5 MG tablet   DULoxetine (CYMBALTA) 60 MG capsule   traZODone (DESYREL) 50 MG tablet   donepezil (ARICEPT) 10 MG tablet   Iron deficiency anemia   Relevant Medications   ferrous sulfate 325 (65 FE) MG tablet    Other Visit Diagnoses    Leg cramps       Relevant Medications   rOPINIRole (REQUIP) 0.25 MG tablet   Dementia with behavioral disturbance       Relevant Medications   risperiDONE (RISPERDAL) 0.5 MG tablet   DULoxetine (CYMBALTA) 60 MG capsule   rOPINIRole (REQUIP) 0.25 MG tablet   traZODone (DESYREL) 50 MG tablet   donepezil (ARICEPT) 10 MG tablet       Follow Up Instructions: Follow-up in 3 to 6 months once they feel comfortable coming back up with the coronavirus.    I discussed the assessment and treatment plan with the patient. The patient was provided an opportunity to ask questions and all were answered. The patient agreed with the plan and demonstrated an understanding of the instructions.   The patient was advised to call back or seek an in-person evaluation if the symptoms worsen or if the condition fails to improve as anticipated.  The above assessment and management plan was discussed with the patient. The patient verbalized understanding of and has agreed to the management plan. Patient is aware to call the clinic if symptoms persist or worsen. Patient is aware when to return to the clinic for a follow-up visit. Patient educated on when it is appropriate to go to the emergency department.    I provided 10 minutes of non-face-to-face time during this encounter.    Worthy Rancher, MD

## 2019-11-01 ENCOUNTER — Ambulatory Visit (INDEPENDENT_AMBULATORY_CARE_PROVIDER_SITE_OTHER): Payer: Medicare HMO | Admitting: Licensed Clinical Social Worker

## 2019-11-01 DIAGNOSIS — G301 Alzheimer's disease with late onset: Secondary | ICD-10-CM

## 2019-11-01 DIAGNOSIS — F331 Major depressive disorder, recurrent, moderate: Secondary | ICD-10-CM

## 2019-11-01 DIAGNOSIS — F02818 Dementia in other diseases classified elsewhere, unspecified severity, with other behavioral disturbance: Secondary | ICD-10-CM

## 2019-11-01 DIAGNOSIS — K219 Gastro-esophageal reflux disease without esophagitis: Secondary | ICD-10-CM

## 2019-11-01 DIAGNOSIS — F0281 Dementia in other diseases classified elsewhere with behavioral disturbance: Secondary | ICD-10-CM | POA: Diagnosis not present

## 2019-11-01 DIAGNOSIS — F419 Anxiety disorder, unspecified: Secondary | ICD-10-CM

## 2019-11-01 NOTE — Patient Instructions (Addendum)
Licensed Clinical Social Worker Visit Information  Goals we discussed today:  Goals Addressed            This Visit's Progress   . Client said she has financial challenges and it is hard to pay for incontinent supplies montylyi (pt-stated)       Current Barriers:  Marland Kitchen Mental Health Challenges of client with chronic diagnoses of GERD, Anxiety, Dementia, Depression, and Insomnia . Financial challenges  Clinical Social Work Clinical Goal(s):  . Client and LCSW will communicate in next 30 days to talk about financial needs of client.  Interventions: Talked with client about CCM program services.  Talked with client about CCM nursing support with RNCM  Talked previously with client/daughter about financial needs of client  Talked with client about her frequent use of incontinent supplies and difficulty in  paying for these supplies  Talked with client about pain issues of client Talked with client about transport needs of client   Patient Self Care Activities:  . Takes medications as prescribed . Attends scheduled medical appointments  Plan:   Client to attend scheduled client medical appointments LCSW to call client or her daughter in next 4 weeks to talk with client or her Daughter about financial needs of client and client procurement of incontinent supplies Client to talk with [facility] regarding nursing needs of client  Initial goal documentation         Materials Provided: NO  Follow Up Plan: LCSW to call client or her daughter in next 4 weeks to talk with client or her daughter about financial needs of client and client procurement of incontinent supplies  The patient/Betty Falconer, daughter, verbalized understanding of instructions provided today and declined a print copy of patient instruction materials.   [name].Keiarah Orlowski MSW, LCSW Licensed Clinical Social Worker [facility] [phone]

## 2019-11-01 NOTE — Chronic Care Management (AMB) (Signed)
Care Management Note   Mallory Steele is a 85 y.o. year old female who is a primary care patient of Dettinger, Fransisca Kaufmann, MD. The CM team was consulted for assistance with chronic disease management and care coordination.   I reached out to Jynesis K Chana Bode [name], daughter, by phone today.   Review of patient status, including review of consultants reports, relevant laboratory and other test results, and collaboration with appropriate care team members and the patient's provider was performed as part of comprehensive patient evaluation and provision of chronic care management services.  Social determinants of health: risk of tobacco use; risk of depression    Chronic Care Management from 06/15/2019 in [place]  PHQ-9 Total Score  10     GAD 7 : Generalized Anxiety Score 06/15/2019  Nervous, Anxious, on Edge 1  Control/stop worrying 3  Worry too much - different things 1  Trouble relaxing 0  Restless 0  Easily annoyed or irritable 1  Afraid - awful might happen 0  Total GAD 7 Score 6  Anxiety Difficulty Somewhat difficult   Medications   clobetasol cream (TEMOVATE) 0.05 % donepezil (ARICEPT) 10 MG tablet DULoxetine (CYMBALTA) 60 MG capsule ferrous sulfate 325 (65 FE) MG tablet FOLIC ACID PO meclizine (ANTIVERT) 25 MG tablet omeprazole (PRILOSEC) 20 MG capsule risperiDONE (RISPERDAL) 0.5 MG tablet rOPINIRole (REQUIP) 0.25 MG tablet traZODone (DESYREL) 50 MG tablet vitamin B-12 (CYANOCOBALAMIN) 250 MCG tablet   Goals Addressed            This Visit's Progress   . Client said she has financial challenges and it is hard to pay for incontinent supplies montylyi (pt-stated)       Current Barriers:  Marland Kitchen Mental Health Challenges of client with chronic diagnoses of GERD, Anxiety, Dementia, Depression, and Insomnia . Financial challenges  Clinical Social Work Clinical Goal(s):  . Client and LCSW will communicate in next 30 days to talk about  financial needs of client.  Interventions: . Talked with client about CCM program services. . Talked with client about [place] with RNCM . Talked previously with client/daughter about financial needs of client . Talked with client about her frequent use of incontinent supplies and difficulty in          paying for these supplies  Talked with client about pain issues of client  Talked with client about transport needs of client  Patient Self Care Activities:  . Takes medications as prescribed . Attends scheduled medical appointments  Plan:   Client to attend scheduled client medical appointments LCSW to call client or her daughter in next 4 weeks to talk with client or her Daughter about financial needs of client and client procurement of incontinent supplies Client to talk with [facility] regarding nursing needs of client  Initial goal documentation      Goal:   Client wants to talk with LCSW about anxiety and depression issues faced   Current Barriers:  Marland Kitchen Mental Health Challenges of client with chronic diagnoses of GERD, Anxiety, Dementia, Depression, and Insomnia . Financial challenges Clinical Social Work Goal:  LCSW to talk with client in next 30 days to discuss anxiety and depression issues faced by client  Interventions: Talked with client about anxiety/depression issues and ways to manage sadness or anxiety  Encouraged client to talk with RNCM to discuss nursing needs of client Talked with client about relaxation techniques of choice for client  Patient Self Care Activities Attends medical appointments Takes  medications as prescribed  Patient Deficits: Needs some help occasionally with ADLs  Initial goal documentation  Follow Up Plan: LCSW to call client or her daughter in next 4 weeks to talk with client or daughter about financial needs of client and client procurement of incontinent supplies  [name].Yani Coventry MSW, LCSW Licensed Clinical Social  Worker [facility] [phone]

## 2019-11-26 ENCOUNTER — Telehealth: Payer: Self-pay | Admitting: Family Medicine

## 2019-11-26 NOTE — Telephone Encounter (Signed)
Aware rx are on file - will call pharmacy.

## 2019-11-26 NOTE — Telephone Encounter (Signed)
What is the name of the medication? trasodone 50mg  ,   Have you contacted your pharmacy to request a refill?yes Which pharmacy would you like this sent to? [facility]   Patient notified that their request is being sent to the clinical staff for review and that they should receive a call once it is complete. If they do not receive a call within 24 hours they can check with their pharmacy or our office.

## 2019-11-29 ENCOUNTER — Ambulatory Visit (INDEPENDENT_AMBULATORY_CARE_PROVIDER_SITE_OTHER): Payer: Medicare HMO | Admitting: Licensed Clinical Social Worker

## 2019-11-29 DIAGNOSIS — F331 Major depressive disorder, recurrent, moderate: Secondary | ICD-10-CM | POA: Diagnosis not present

## 2019-11-29 DIAGNOSIS — G301 Alzheimer's disease with late onset: Secondary | ICD-10-CM | POA: Diagnosis not present

## 2019-11-29 DIAGNOSIS — K219 Gastro-esophageal reflux disease without esophagitis: Secondary | ICD-10-CM

## 2019-11-29 DIAGNOSIS — F02818 Dementia in other diseases classified elsewhere, unspecified severity, with other behavioral disturbance: Secondary | ICD-10-CM

## 2019-11-29 DIAGNOSIS — F0281 Dementia in other diseases classified elsewhere with behavioral disturbance: Secondary | ICD-10-CM | POA: Diagnosis not present

## 2019-11-29 DIAGNOSIS — F419 Anxiety disorder, unspecified: Secondary | ICD-10-CM

## 2019-11-29 NOTE — Patient Instructions (Addendum)
Licensed Clinical Social Worker Visit Information  Goals we discussed today:  Goals Addressed            This Visit's Progress   . Client said she has financial challenges and it is hard to pay for incontinent supplies montylyi (pt-stated)       Current Barriers:  Marland Kitchen Mental Health Challenges of client with chronic diagnoses of GERD, Anxiety, Dementia, Depression, and Insomnia . Financial challenges  Clinical Social Work Clinical Goal(s):  . Client and LCSW will communicate in next 30 days to talk about financial needs of client.  Interventions: Talked with [name] about CCM nursing support with [facility]  Talked previously with client/daughter about financial needs of client  Talked with Arelis Nighswander about client frequent use of incontinent supplies and difficulty in               paying for these supplies  Talked with [name] about pain issues of client Talked with Onolee Landeck about transport needs of client  Talked with [name] about client upcoming appointment with Dr. Modena Nunnery, dermatologist  Patient Self Care Activities:  . Takes medications as prescribed . Attends scheduled medical appointments  Plan:   Client to attend scheduled client medical appointments LCSW to call client or her daughter in next 4 weeks to talk with client or her Daughter about financial needs of client and client procurement of incontinent supplies Client to talk with [facility] regarding nursing needs of client  Initial goal documentation         Materials Provided: No  Follow Up Plan: LCSW to call client or her daughter in next 4 weeks to talk with client/daughter about financial needs of client and about client procurement of incontinent supplies  The patient/Mallory Steele, daughter, verbalized understanding of instructions provided today and declined a print copy of patient instruction materials.   [name].Dimitrius Steedman MSW, LCSW Licensed Clinical Social Worker [place]  [facility] [phone]

## 2019-11-29 NOTE — Chronic Care Management (AMB) (Signed)
  Care Management Note   Mallory Steele is a 86 y.o. year old female who is a primary care patient of Dettinger, Fransisca Kaufmann, MD. The CM team was consulted for assistance with chronic disease management and care coordination.   I reached out to Mallory Steele, daughter,  by phone today.     Review of patient status, including review of consultants reports, relevant laboratory and other test results, and collaboration with appropriate care team members and the patient's provider was performed as part of comprehensive patient evaluation and provision of chronic care management services.   Social determinants of health: risk of tobacco use; risk of depression    Chronic Care Management from 06/15/2019 in [place]  PHQ-9 Total Score  10     GAD 7 : Generalized Anxiety Score 06/15/2019  Nervous, Anxious, on Edge 1  Control/stop worrying 3  Worry too much - different things 1  Trouble relaxing 0  Restless 0  Easily annoyed or irritable 1  Afraid - awful might happen 0  Total GAD 7 Score 6  Anxiety Difficulty Somewhat difficult   Medications   clobetasol cream (TEMOVATE) 0.05 % donepezil (ARICEPT) 10 MG tablet DULoxetine (CYMBALTA) 60 MG capsule ferrous sulfate 325 (65 FE) MG tablet FOLIC ACID PO meclizine (ANTIVERT) 25 MG tablet omeprazole (PRILOSEC) 20 MG capsule risperiDONE (RISPERDAL) 0.5 MG tablet rOPINIRole (REQUIP) 0.25 MG tablet traZODone (DESYREL) 50 MG tablet vitamin B-12 (CYANOCOBALAMIN) 250 MCG tablet Goals Addressed            This Visit's Progress   . Client said she has financial challenges and it is hard to pay for incontinent supplies montylyi (pt-stated)       Current Barriers:  Marland Kitchen Mental Health Challenges of client with chronic diagnoses of GERD, Anxiety, Dementia, Depression, and Insomnia . Financial challenges  Clinical Social Work Clinical Goal(s):  . Client and LCSW will communicate in next 30 days to talk about  financial needs of client.  Interventions:  Talked with Elsie Steele about CCM nursing support with [facility]  Talked previously with client/daughter about financial needs of client  Talked with Moses Carland about client frequent use of incontinent supplies and difficulty in          paying for these supplies  Talked with Elsie Steele about pain issues of client  Talked with Dahlila Hrivnak about transport needs of client  Talked with Elsie Steele about client upcoming appointment with Dr. Modena Nunnery, dermatologist  Patient Self Care Activities:  . Takes medications as prescribed . Attends scheduled medical appointments  Plan:   Client to attend scheduled client medical appointments LCSW to call client or her daughter in next 4 weeks to talk with client or her Daughter about financial needs of client and client procurement of incontinent supplies Client to talk with [facility] regarding nursing needs of client  Initial goal documentation       Follow Up Plan: LCSW to call client or her daughter in next 4 weeks to talk with client/daughter about financial needs of client and about client procurement of incontinent supplies  [name].Cloud Graham MSW, LCSW Licensed Clinical Social Worker [facility] [phone]

## 2019-12-05 DIAGNOSIS — L409 Psoriasis, unspecified: Secondary | ICD-10-CM | POA: Diagnosis not present

## 2019-12-05 DIAGNOSIS — Z79899 Other long term (current) drug therapy: Secondary | ICD-10-CM | POA: Diagnosis not present

## 2019-12-27 ENCOUNTER — Ambulatory Visit: Payer: Medicare HMO | Admitting: Family Medicine

## 2019-12-28 ENCOUNTER — Ambulatory Visit (INDEPENDENT_AMBULATORY_CARE_PROVIDER_SITE_OTHER): Payer: Medicare HMO | Admitting: Licensed Clinical Social Worker

## 2019-12-28 DIAGNOSIS — F0281 Dementia in other diseases classified elsewhere with behavioral disturbance: Secondary | ICD-10-CM

## 2019-12-28 DIAGNOSIS — F419 Anxiety disorder, unspecified: Secondary | ICD-10-CM

## 2019-12-28 DIAGNOSIS — F02818 Dementia in other diseases classified elsewhere, unspecified severity, with other behavioral disturbance: Secondary | ICD-10-CM

## 2019-12-28 DIAGNOSIS — G301 Alzheimer's disease with late onset: Secondary | ICD-10-CM | POA: Diagnosis not present

## 2019-12-28 DIAGNOSIS — K219 Gastro-esophageal reflux disease without esophagitis: Secondary | ICD-10-CM

## 2019-12-28 DIAGNOSIS — F331 Major depressive disorder, recurrent, moderate: Secondary | ICD-10-CM | POA: Diagnosis not present

## 2019-12-28 NOTE — Chronic Care Management (AMB) (Signed)
  Care Management Note   Jayna RANIE KNOEDLER is a 86 y.o. year old female who is a primary care patient of Dettinger, Fransisca Kaufmann, MD. The CM team was consulted for assistance with chronic disease management and care coordination.   I reached out to Jelisa K Chana Bode [name], daughter, by phone today.   Review of patient status, including review of consultants reports, relevant laboratory and other test results, and collaboration with appropriate care team members and the patient's provider was performed as part of comprehensive patient evaluation and provision of chronic care management services.   Social determinants of health: risk of tobacco use; risk of depression    Chronic Care Management from 06/15/2019 in [place]  PHQ-9 Total Score  10     GAD 7 : Generalized Anxiety Score 06/15/2019  Nervous, Anxious, on Edge 1  Control/stop worrying 3  Worry too much - different things 1  Trouble relaxing 0  Restless 0  Easily annoyed or irritable 1  Afraid - awful might happen 0  Total GAD 7 Score 6  Anxiety Difficulty Somewhat difficult   Medications   clobetasol cream (TEMOVATE) 0.05 % donepezil (ARICEPT) 10 MG tablet DULoxetine (CYMBALTA) 60 MG capsule ferrous sulfate 325 (65 FE) MG tablet FOLIC ACID PO meclizine (ANTIVERT) 25 MG tablet omeprazole (PRILOSEC) 20 MG capsule risperiDONE (RISPERDAL) 0.5 MG tablet rOPINIRole (REQUIP) 0.25 MG tablet traZODone (DESYREL) 50 MG tablet vitamin B-12 (CYANOCOBALAMIN) 250 MCG tablet  Goals    . Client said she has financial challenges and it is hard to pay for incontinent supplies montylyi (pt-stated)     Current Barriers:  Marland Kitchen Mental Health Challenges of client with chronic diagnoses of GERD, Anxiety, Dementia, Depression, and Insomnia . Financial challenges  Clinical Social Work Clinical Goal(s):  . Client and LCSW will communicate in next 30 days to talk about financial needs of client.  Interventions:   Previously talked with [name] about CCM nursing support with [facility]  Talkedpreviouslywith client/daughterabout financial needs of client  Previously talked with [name] about client frequent use of incontinent supplies and difficulty in  paying for these supplies  Talked with [name] about pain issues of client  Talked with Leylanni Benefield about transport needs of client  Patient Self Care Activities:  . Takes medications as prescribed . Attends scheduled medical appointments  Plan:   Client to attend scheduled client medical appointments LCSW to call client or her daughter in next 4 weeks to talk with client or her Daughter about financial needs of client and client procurement of incontinent supplies Client to talk with [facility] regarding nursing needs of client  Initial goal documentation       Follow Up Plan: LCSW to call client or her daughter in the next 4 weeks to talk with client/daughter about financial needs of client and about client procurement of incontinent supplies  [name].Lacoya Wilbanks MSW, LCSW Licensed Clinical Social Worker [facility] [phone]

## 2019-12-28 NOTE — Patient Instructions (Addendum)
Licensed Clinical Social Worker Visit Information  Goals we discussed today:  Goals    . Client said she has financial challenges and it is hard to pay for incontinent supplies montylyi (pt-stated)     Current Barriers:  Marland Kitchen Mental Health Challenges of client with chronic diagnoses of GERD, Anxiety, Dementia, Depression, and Insomnia . Financial challenges  Clinical Social Work Clinical Goal(s):  . Client and LCSW will communicate in next 30 days to talk about financial needs of client.  Interventions:   Previously talked withBetty Colemanabout CCM nursing support with RNCM  Talkedpreviouslywith client/daughterabout financial needs of client  Previously talked withBetty Colemanaboutclientfrequent use of incontinent supplies and difficulty in  paying for these supplies  Talked withBetty Colemanabout pain issues of client  Talked withBetty Colemanabout transport needs of client  Patient Self Care Activities:  . Takes medications as prescribed . Attends scheduled medical appointments  Plan:   Client to attend scheduled client medical appointments LCSW to call client or her daughter in next 4 weeks to talk with client or her Daughter about financial needs of client and client procurement of incontinent supplies Client to talk with [facility] regarding nursing needs of client  Initial goal documentation        Materials Provided: No  Follow Up Plan: . LCSW to call client or her daughter in next 4 weeks to talk with client or her daughter about financial needs of client and about client procurement of incontinent supplies  The patient Legacie Detore, daughter, verbalized understanding of instructions provided today and declined a print copy of patient instruction materials.   [name].Alyra Patty MSW, LCSW Licensed Clinical Social Worker [facility] [phone]

## 2020-01-14 ENCOUNTER — Ambulatory Visit (INDEPENDENT_AMBULATORY_CARE_PROVIDER_SITE_OTHER): Payer: Medicare HMO | Admitting: Family

## 2020-01-14 ENCOUNTER — Other Ambulatory Visit: Payer: Self-pay

## 2020-01-14 ENCOUNTER — Encounter: Payer: Self-pay | Admitting: Family

## 2020-01-14 VITALS — BP 140/74 | HR 72 | Temp 96.4°F | Ht 65.0 in | Wt 170.8 lb

## 2020-01-14 DIAGNOSIS — M19071 Primary osteoarthritis, right ankle and foot: Secondary | ICD-10-CM | POA: Diagnosis not present

## 2020-01-14 DIAGNOSIS — F0281 Dementia in other diseases classified elsewhere with behavioral disturbance: Secondary | ICD-10-CM

## 2020-01-14 DIAGNOSIS — G301 Alzheimer's disease with late onset: Secondary | ICD-10-CM

## 2020-01-14 DIAGNOSIS — R252 Cramp and spasm: Secondary | ICD-10-CM

## 2020-01-14 DIAGNOSIS — M79661 Pain in right lower leg: Secondary | ICD-10-CM

## 2020-01-14 DIAGNOSIS — M25571 Pain in right ankle and joints of right foot: Secondary | ICD-10-CM | POA: Diagnosis not present

## 2020-01-14 DIAGNOSIS — F02818 Dementia in other diseases classified elsewhere, unspecified severity, with other behavioral disturbance: Secondary | ICD-10-CM

## 2020-01-14 MED ORDER — DEPEND UNDERWEAR LARGE MISC: 1.0000 | Freq: Four times a day (QID) | 11 refills | Status: DC | PRN

## 2020-01-14 NOTE — Progress Notes (Signed)
Subjective:    Patient ID: Mallory Steele, female    DOB: [birth year], 86 y.o.   MRN: [id number]  Chief Complaint  Patient presents with  . Leg Pain    right with swelling    HPI Pt presents to the office today with right calf pain that started over a week that has gradually worsen. She reports mild swelling, cramping. She reports intermittent cramping pain of 4 out 10 and a 0 right now She states the pain is worse at nights.   Denies any SOB or chest pain.   Denies any injury to her leg, or changes in her activity.    She reports she has had leg cramps during the night and pain in her ankle.   She has dementia and in the office she is now reporting she has 0 pain in calf and it is all in her ankle. Daughter states she has not heard her complain of ankle pain only calf pain. Daughter reports she has had a DVT in the past and her mother complaining of calf pain makes her nervous.   Review of Systems  All other systems reviewed and are negative.      Objective:   Physical Exam Vitals reviewed.  Constitutional:      General: She is not in acute distress.    Appearance: She is well-developed.  HENT:     Head: Normocephalic and atraumatic.  Eyes:     Pupils: Pupils are equal, round, and reactive to light.  Neck:     Thyroid: No thyromegaly.  Cardiovascular:     Rate and Rhythm: Normal rate and regular rhythm.     Heart sounds: Normal heart sounds. No murmur.  Pulmonary:     Effort: Pulmonary effort is normal. No respiratory distress.     Breath sounds: Normal breath sounds. No wheezing.  Abdominal:     General: Bowel sounds are normal. There is no distension.     Palpations: Abdomen is soft.     Tenderness: There is no abdominal tenderness.  Musculoskeletal:        General: No tenderness. Normal range of motion.     Cervical back: Normal range of motion and neck supple.     Comments: Negative Homan's sign  Skin:    General: Skin is warm and dry.  Neurological:   Mental Status: She is alert and oriented to person, place, and time.     Cranial Nerves: No cranial nerve deficit.     Deep Tendon Reflexes: Reflexes are normal and symmetric.  Psychiatric:        Behavior: Behavior normal.        Thought Content: Thought content normal.        Judgment: Judgment normal.     BP 140/74   Pulse 72   Temp (!) 96.4 F (35.8 C) (Temporal)   Ht 5\' 5"  (1.651 m)   Wt 170 lb 12.8 oz (77.5 kg)   SpO2 94%   BMI 28.42 kg/m      Assessment & Plan:  Mallory Steele comes in today with chief complaint of Leg Pain (right with swelling)   Diagnosis and orders addressed:  1. Right calf pain - US Venous Img Lower Unilateral Right; Future  2. Acute right ankle pain - US Venous Img Lower Unilateral Right; Future  3. Late onset Alzheimer's disease with behavioral disturbance (HCC) - Incontinence Supply Disposable (DEPEND UNDERWEAR LARGE) MISC; 1 each by Does not apply route 4 (four) times  daily as needed.  Dispense: 60 each; Refill: 11  4. Leg cramp  5. Primary osteoarthritis of right ankle  I will order [place] to rule out DVT, I do not believe this is a DVT on physical exam Could be related to leg cramps during the night Tylenol as needed for pain  Evelina Dun, FNP

## 2020-01-14 NOTE — Patient Instructions (Signed)

## 2020-01-24 ENCOUNTER — Telehealth: Payer: Self-pay

## 2020-01-25 ENCOUNTER — Ambulatory Visit (INDEPENDENT_AMBULATORY_CARE_PROVIDER_SITE_OTHER): Payer: Medicare HMO | Admitting: Licensed Clinical Social Worker

## 2020-01-25 DIAGNOSIS — K219 Gastro-esophageal reflux disease without esophagitis: Secondary | ICD-10-CM

## 2020-01-25 DIAGNOSIS — F02818 Dementia in other diseases classified elsewhere, unspecified severity, with other behavioral disturbance: Secondary | ICD-10-CM

## 2020-01-25 DIAGNOSIS — F0281 Dementia in other diseases classified elsewhere with behavioral disturbance: Secondary | ICD-10-CM

## 2020-01-25 DIAGNOSIS — F331 Major depressive disorder, recurrent, moderate: Secondary | ICD-10-CM

## 2020-01-25 DIAGNOSIS — G301 Alzheimer's disease with late onset: Secondary | ICD-10-CM

## 2020-01-25 DIAGNOSIS — F419 Anxiety disorder, unspecified: Secondary | ICD-10-CM

## 2020-01-25 NOTE — Patient Instructions (Addendum)
Licensed Clinical Social Worker Visit Information  Goals we discussed today:  Goals    . Client said she has financial challenges and it is hard to pay for incontinent supplies montylyi (pt-stated)     Current Barriers:  Marland Kitchen Mental Health Challenges of client with chronic diagnoses of GERD, Anxiety, Dementia, Depression, and Insomnia . Financial challenges  Clinical Social Work Clinical Goal(s):  . Client and LCSW will communicate in next 30 days to talk about financial needs of client.  Interventions:  Talked with client about CCM nursing support with RNCM  Talked previously with client/daughter about financial needs of client  Previously talked with Mallory Steele about client frequent use of incontinent supplies and difficulty in paying for these supplies Talked with client about pain issues of client  Talked with client about transport needs of client  Provided counseling support for client  Patient Self Care Activities:  . Takes medications as prescribed . Attends scheduled medical appointments  Plan:   Client to attend scheduled client medical appointments LCSW to call client or her daughter in next 4 weeks to talk with client or her Daughter about financial needs of client and client procurement of incontinent supplies Client to talk with [facility] regarding nursing needs of client  Initial goal documentation       Materials Provided:  No  Follow Up Plan:  LCSW to call client/daughter in next 4 weeks to talk with client or her  daughter about financial needs of client and about client procurement of incontinent supplies  The patient Mallory Steele, daughter, verbalized understanding of instructions provided today and declined a print copy of patient instruction materials.   [name].Sumiya Mamaril MSW, LCSW Licensed Clinical Social Worker [facility] Care [facility] [phone]

## 2020-01-25 NOTE — Chronic Care Management (AMB) (Signed)
  Care Management Note   Mallory Steele is a 86 y.o. year old female who is a primary care patient of Dettinger, Fransisca Kaufmann, MD. The CM team was consulted for assistance with chronic disease management and care coordination.   I reached out to Lorenso Quarry by phone today.    Review of patient status, including review of consultants reports, relevant laboratory and other test results, and collaboration with appropriate care team members and the patient's provider was performed as part of comprehensive patient evaluation and provision of chronic care management services.   Social determinants of health:risk of tobacco use; risk of depression    Chronic Care Management from 06/15/2019 in [place]  PHQ-9 Total Score  10     GAD 7 : Generalized Anxiety Score 06/15/2019  Nervous, Anxious, on Edge 1  Control/stop worrying 3  Worry too much - different things 1  Trouble relaxing 0  Restless 0  Easily annoyed or irritable 1  Afraid - awful might happen 0  Total GAD 7 Score 6  Anxiety Difficulty Somewhat difficult   Medications   clobetasol cream (TEMOVATE) 0.05 % donepezil (ARICEPT) 10 MG tablet DULoxetine (CYMBALTA) 60 MG capsule ferrous sulfate 325 (65 FE) MG tablet FOLIC ACID PO Incontinence Supply Disposable (DEPEND UNDERWEAR LARGE) MISC meclizine (ANTIVERT) 25 MG tablet methotrexate (RHEUMATREX) 2.5 MG tablet omeprazole (PRILOSEC) 20 MG capsule risperiDONE (RISPERDAL) 0.5 MG tablet rOPINIRole (REQUIP) 0.25 MG tablet traZODone (DESYREL) 50 MG tablet vitamin B-12 (CYANOCOBALAMIN) 250 MCG tablet  Goals    . Client said she has financial challenges and it is hard to pay for incontinent supplies montylyi (pt-stated)     Current Barriers:  Marland Kitchen Mental Health Challenges of client with chronic diagnoses of GERD, Anxiety, Dementia, Depression, and Insomnia . Financial challenges  Clinical Social Work Clinical Goal(s):  . Client and LCSW will communicate in  next 30 days to talk about financial needs of client.  Interventions:  Talked with client about CCM nursing support with [place] client/daughterabout financial needs of client  Previously talked withBetty Colemanaboutclientfrequent use of incontinent supplies and difficulty in paying for these supplies  Talked withclient about pain issues of client   Talked withclient about transport needs of client  Provided counseling support for client  Patient Self Care Activities:  . Takes medications as prescribed . Attends scheduled medical appointments  Plan:   Client to attend scheduled client medical appointments LCSW to call client or her daughter in next 4 weeks to talk with client or her Daughter about financial needs of client and client procurement of incontinent supplies Client to talk with [facility] regarding nursing needs of client  Initial goal documentation       Follow Up Plan: LCSW to call client or her daughter in next 4 weeks to talk with client/daughter about financial needs of client and about client procurement of incontinent supplies  [name].Jasper Hanf MSW, LCSW Licensed Clinical Social Worker [facility] Care [facility] [phone]

## 2020-02-07 ENCOUNTER — Telehealth: Payer: Self-pay | Admitting: Family Medicine

## 2020-02-11 ENCOUNTER — Other Ambulatory Visit: Payer: Self-pay

## 2020-02-11 ENCOUNTER — Ambulatory Visit (HOSPITAL_COMMUNITY)
Admission: RE | Admit: 2020-02-11 | Discharge: 2020-02-11 | Disposition: A | Discharge status: Home / Self Care | Payer: Medicare HMO | Source: Ambulatory Visit | Attending: Family | Admitting: Family

## 2020-02-11 DIAGNOSIS — M79661 Pain in right lower leg: Secondary | ICD-10-CM | POA: Diagnosis not present

## 2020-02-11 DIAGNOSIS — M25571 Pain in right ankle and joints of right foot: Secondary | ICD-10-CM | POA: Insufficient documentation

## 2020-02-25 ENCOUNTER — Ambulatory Visit: Payer: Self-pay | Admitting: Licensed Clinical Social Worker

## 2020-02-25 DIAGNOSIS — K219 Gastro-esophageal reflux disease without esophagitis: Secondary | ICD-10-CM

## 2020-02-25 DIAGNOSIS — F419 Anxiety disorder, unspecified: Secondary | ICD-10-CM

## 2020-02-25 DIAGNOSIS — F331 Major depressive disorder, recurrent, moderate: Secondary | ICD-10-CM

## 2020-02-25 NOTE — Patient Instructions (Addendum)
Licensed Clinical Social Worker Visit Information  Goals we discussed today:  Goals    . Client said she has financial challenges and it is hard to pay for incontinent supplies montylyi (pt-stated)     Current Barriers:  Marland Kitchen Mental Health Challenges of client with chronic diagnoses of GERD, Anxiety, Dementia, Depression, and Insomnia . Financial challenges  Clinical Social Work Clinical Goal(s):  . Client and LCSW will communicate in next 30 days to talk about financial needs of client.  Interventions:  Talked previously with client about CCM nursing support with RNCM  Talked previously with client/daughter about financial needs of client  Previously talked with [name] about client frequent use of incontinent supplies and difficulty in paying for these supplies Previously talked with client about pain issues of client    Talked previously with client about transport needs of client  Patient Self Care Activities:  . Takes medications as prescribed . Attends scheduled medical appointments  Plan:   Client to attend scheduled client medical appointments LCSW to call client or her daughter in next 4 weeks to talk with client or her Daughter about financial needs of client and client procurement of incontinent supplies Client to talk with [facility] regarding nursing needs of client  Initial goal documentation       Materials Provided: No  Follow Up Plan: LCSW to call client/daughter in next 4 weeks to talk with client/daughter about the financial needs of client and about client procurement of incontinent supplies  The patient/Betty Erbes, daughter, verbalized understanding of instructions provided today and declined a print copy of patient instruction materials.   [name].Luvada Salamone MSW, LCSW Licensed Clinical Social Worker [facility] Care Management [phone]

## 2020-02-25 NOTE — Chronic Care Management (AMB) (Signed)
  Care Management Note   Mallory Steele is a 86 y.o. year old female who is a primary care patient of Dettinger, Fransisca Kaufmann, MD. The CM team was consulted for assistance with chronic disease management and care coordination.   I reached out to [name], daughter,by phone today.   Review of patient status, including review of consultants reports, relevant laboratory and other test results, and collaboration with appropriate care team members and the patient's provider was performed as part of comprehensive patient evaluation and provision of chronic care management services.   Social determinants of health: risk of tobacco use; risk of depression    Chronic Care Management from 06/15/2019 in [place]  PHQ-9 Total Score  10     GAD 7 : Generalized Anxiety Score 06/15/2019  Nervous, Anxious, on Edge 1  Control/stop worrying 3  Worry too much - different things 1  Trouble relaxing 0  Restless 0  Easily annoyed or irritable 1  Afraid - awful might happen 0  Total GAD 7 Score 6  Anxiety Difficulty Somewhat difficult   Medications   (very important)  New medications from outside sources are available for reconciliation  clobetasol cream (TEMOVATE) 0.05 % donepezil (ARICEPT) 10 MG tablet DULoxetine (CYMBALTA) 60 MG capsule ferrous sulfate 325 (65 FE) MG tablet FOLIC ACID PO Incontinence Supply Disposable (DEPEND UNDERWEAR LARGE) MISC meclizine (ANTIVERT) 25 MG tablet methotrexate (RHEUMATREX) 2.5 MG tablet omeprazole (PRILOSEC) 20 MG capsule risperiDONE (RISPERDAL) 0.5 MG tablet rOPINIRole (REQUIP) 0.25 MG tablet traZODone (DESYREL) 50 MG tablet vitamin B-12 (CYANOCOBALAMIN) 250 MCG tablet  Goals    . Client said she has financial challenges and it is hard to pay for incontinent supplies montylyi (pt-stated)     Current Barriers:  Marland Kitchen Mental Health Challenges of client with chronic diagnoses of GERD, Anxiety, Dementia, Depression, and  Insomnia . Financial challenges  Clinical Social Work Clinical Goal(s):  . Client and LCSW will communicate in next 30 days to talk about financial needs of client.  Interventions:  Talked previously with client about CCM nursing support with [place] client/daughterabout financial needs of client  Previously talked withBetty Colemanaboutclientfrequent use of incontinent supplies and difficulty in paying for these supplies  Previously talked withclient about pain issues of client   Talked previously withclient about transport needs of client  Patient Self Care Activities:  . Takes medications as prescribed . Attends scheduled medical appointments  Plan:   Client to attend scheduled client medical appointments LCSW to call client or her daughter in next 4 weeks to talk with client or her Daughter about financial needs of client and client procurement of incontinent supplies Client to talk with [facility] regarding nursing needs of client  Initial goal documentation      Follow Up Plan: LCSW to call client/daughter in next 4 weeks to talk about financial needs of client and to talk about client procurement of incontinent supplies  Mallory Riffle.[name] MSW, LCSW Licensed Clinical Social Worker [facility] [phone]

## 2020-02-27 ENCOUNTER — Ambulatory Visit (INDEPENDENT_AMBULATORY_CARE_PROVIDER_SITE_OTHER): Payer: Medicare HMO | Admitting: Licensed Clinical Social Worker

## 2020-02-27 DIAGNOSIS — F0281 Dementia in other diseases classified elsewhere with behavioral disturbance: Secondary | ICD-10-CM | POA: Diagnosis not present

## 2020-02-27 DIAGNOSIS — G301 Alzheimer's disease with late onset: Secondary | ICD-10-CM | POA: Diagnosis not present

## 2020-02-27 DIAGNOSIS — K219 Gastro-esophageal reflux disease without esophagitis: Secondary | ICD-10-CM

## 2020-02-27 DIAGNOSIS — F02818 Dementia in other diseases classified elsewhere, unspecified severity, with other behavioral disturbance: Secondary | ICD-10-CM

## 2020-02-27 DIAGNOSIS — F331 Major depressive disorder, recurrent, moderate: Secondary | ICD-10-CM

## 2020-02-27 DIAGNOSIS — F419 Anxiety disorder, unspecified: Secondary | ICD-10-CM

## 2020-02-27 NOTE — Patient Instructions (Addendum)
Licensed Clinical Social Worker Visit Information  Goals we discussed today:  Goals    . Client said she has financial challenges and it is hard to pay for incontinent supplies montylyi (pt-stated)     Current Barriers:  Marland Kitchen Mental Health Challenges of client with chronic diagnoses of GERD, Anxiety, Dementia, Depression, and Insomnia . Financial challenges  Clinical Social Work Clinical Goal(s):  . Client and LCSW will communicate in next 30 days to talk about financial needs of client.  Interventions: Talked previously with client about CCM nursing support with RNCM  Talked previously with client/daughter about financial needs of client  Previously talked with [name] about client frequent use of incontinent supplies and difficulty in paying for these supplies Previously talked with client about pain issues of client  Talked previously with client about transport needs of client  Patient Self Care Activities:  . Takes medications as prescribed . Attends scheduled medical appointments  Plan:   Client to attend scheduled client medical appointments LCSW to call client or her daughter in next 4 weeks to talk with client or her Daughter about financial needs of client and client procurement of incontinent supplies Client to talk with [facility] regarding nursing needs of client  Initial goal documentation       Materials Provided:   Follow Up Plan: LCSW to call client or her daughter in next 4 weeks to talk with client or her daughter about financial needs of client and about client procurement of  incontinent supplies  The patient/Mallory Steele, daughter verbalized understanding of instructions provided today and declined a print copy of patient instruction materials.   [name].Suliman Termini MSW, LCSW Licensed Clinical Social Worker [facility] Care [facility] [phone]

## 2020-02-27 NOTE — Chronic Care Management (AMB) (Signed)
  Care Management Note   Mallory Steele is a 86 y.o. year old female who is a primary care patient of Dettinger, Fransisca Kaufmann, MD. The CM team was consulted for assistance with chronic disease management and care coordination.   I reached out to [employer], daughter, by phone today.    Review of patient status, including review of consultants reports, relevant laboratory and other test results, and collaboration with appropriate care team members and the patient's provider was performed as part of comprehensive patient evaluation and provision of chronic care management services.   Social determinants of health: risk of tobacco use; risk of depression    Chronic Care Management from 06/15/2019 in [place]  PHQ-9 Total Score  10     GAD 7 : Generalized Anxiety Score 06/15/2019  Nervous, Anxious, on Edge 1  Control/stop worrying 3  Worry too much - different things 1  Trouble relaxing 0  Restless 0  Easily annoyed or irritable 1  Afraid - awful might happen 0  Total GAD 7 Score 6  Anxiety Difficulty Somewhat difficult   Medications   (very important)  New medications from outside sources are available for reconciliation  clobetasol cream (TEMOVATE) 0.05 % donepezil (ARICEPT) 10 MG tablet DULoxetine (CYMBALTA) 60 MG capsule ferrous sulfate 325 (65 FE) MG tablet FOLIC ACID PO Incontinence Supply Disposable (DEPEND UNDERWEAR LARGE) MISC meclizine (ANTIVERT) 25 MG tablet methotrexate (RHEUMATREX) 2.5 MG tablet omeprazole (PRILOSEC) 20 MG capsule risperiDONE (RISPERDAL) 0.5 MG tablet rOPINIRole (REQUIP) 0.25 MG tablet traZODone (DESYREL) 50 MG tablet vitamin B-12 (CYANOCOBALAMIN) 250 MCG tablet  Goals    . Client said she has financial challenges and it is hard to pay for incontinent supplies montylyi (pt-stated)     Current Barriers:  Marland Kitchen Mental Health Challenges of client with chronic diagnoses of GERD, Anxiety, Dementia, Depression, and  Insomnia . Financial challenges  Clinical Social Work Clinical Goal(s):  . Client and LCSW will communicate in next 30 days to talk about financial needs of client.  Interventions:  Talked previously with clientabout CCM nursing support with [facility]  Talkedpreviouslywith client/daughterabout financial needs of client  Previously talked withBetty Colemanaboutclientfrequent use of incontinent supplies and difficulty in paying for these supplies  Previously talked withclientabout pain issues of client  Talked previously withclientabout transport needs of client  Patient Self Care Activities:  . Takes medications as prescribed . Attends scheduled medical appointments  Plan:   Client to attend scheduled client medical appointments LCSW to call client or her daughter in next 4 weeks to talk with client or her Daughter about financial needs of client and client procurement of incontinent supplies Client to talk with [facility] regarding nursing needs of client  Initial goal documentation      Follow Up Plan: LCSW to call client/daughter in next 4 weeks to talk with client/daughter about the financial needs of client and about client procurement of incontinent supplies  Mallory Steele.Mallory Steele MSW, LCSW Licensed Clinical Social Worker [facility] Care [facility] [phone]

## 2020-03-28 ENCOUNTER — Ambulatory Visit (INDEPENDENT_AMBULATORY_CARE_PROVIDER_SITE_OTHER): Payer: Medicare HMO | Admitting: Licensed Clinical Social Worker

## 2020-03-28 DIAGNOSIS — F419 Anxiety disorder, unspecified: Secondary | ICD-10-CM

## 2020-03-28 DIAGNOSIS — F331 Major depressive disorder, recurrent, moderate: Secondary | ICD-10-CM | POA: Diagnosis not present

## 2020-03-28 DIAGNOSIS — K219 Gastro-esophageal reflux disease without esophagitis: Secondary | ICD-10-CM

## 2020-03-28 DIAGNOSIS — D509 Iron deficiency anemia, unspecified: Secondary | ICD-10-CM

## 2020-03-28 NOTE — Chronic Care Management (AMB) (Signed)
Chronic Care Management    Clinical Social Work Follow Up Note  03/28/2020 Name: Mallory Steele MRN: [id number] DOB: [birth year]  Mallory Steele is a 86 y.o. year old female who is a primary care patient of Dettinger, Fransisca Kaufmann, MD. The CCM team was consulted for assistance with [name] .   Review of patient status, including review of consultants reports, other relevant assessments, and collaboration with appropriate care team members and the patient's provider was performed as part of comprehensive patient evaluation and provision of chronic care management services.    SDOH (Social Determinants of Health) assessments performed: Yes;risk for tobacco use; risk for depression    Chronic Care Management from 06/15/2019 in [place]  PHQ-9 Total Score  10       GAD 7 : Generalized Anxiety Score 06/15/2019  Nervous, Anxious, on Edge 1  Control/stop worrying 3  Worry too much - different things 1  Trouble relaxing 0  Restless 0  Easily annoyed or irritable 1  Afraid - awful might happen 0  Total GAD 7 Score 6  Anxiety Difficulty Somewhat difficult    Outpatient Encounter Medications as of 03/28/2020  Medication Sig  . clobetasol cream (TEMOVATE) [id number] % Apply 1 application topically 2 (two) times daily.  Marland Kitchen donepezil (ARICEPT) 10 MG tablet Take 1 tablet (10 mg total) by mouth daily.  . DULoxetine (CYMBALTA) 60 MG capsule Take 2 capsules (120 mg total) by mouth daily. (Needs to be seen before next refill)  . ferrous sulfate 325 (65 FE) MG tablet Take 1 tablet (325 mg total) by mouth daily with breakfast.  . FOLIC ACID PO Take by mouth.  . Incontinence Supply Disposable (DEPEND UNDERWEAR LARGE) MISC 1 each by Does not apply route 4 (four) times daily as needed.  . meclizine (ANTIVERT) 25 MG tablet TAKE 1 TABLET (25 MG TOTAL) BY MOUTH 3 (THREE) TIMES DAILY AS NEEDED FOR DIZZINESS.  . methotrexate (RHEUMATREX) 2.5 MG tablet   . omeprazole (PRILOSEC) 20 MG  capsule Take 1 capsule (20 mg total) by mouth daily.  . risperiDONE (RISPERDAL) 0.5 MG tablet Take 1 tablet (0.5 mg total) by mouth 2 (two) times daily.  Marland Kitchen rOPINIRole (REQUIP) 0.25 MG tablet Take 1 tablet (0.25 mg total) by mouth 3 (three) times daily.  . traZODone (DESYREL) 50 MG tablet Take 1-2 tablets (50-100 mg total) by mouth at bedtime as needed for sleep.  . vitamin B-12 (CYANOCOBALAMIN) 250 MCG tablet TAKE 1 TABLET BY MOUTH EVERY DAY   No facility-administered encounter medications on file as of 03/28/2020.    Goals    . Client said she has financial challenges and it is hard to pay for incontinent supplies montylyi (pt-stated)     Current Barriers:  Marland Kitchen Mental Health Challenges of client with chronic diagnoses of GERD, Anxiety, Dementia, Depression, and Insomnia . Financial challenges  Clinical Social Work Clinical Goal(s):  . Client and LCSW will communicate in next 30 days to talk about financial needs of client.  Interventions: . Talked with client about CCM program services. . Talked with client about Mallory Steele with RNCM . Talked previously with client about financial needs of client . Talked with client about her frequent use of incontinent supplies and difficulty in          paying for these supplies  Talked with client about ambulation needs of client  Talked with client about pain issues of client  Talked with client about client's upcoming medical  appointments  Talked with client about client completion of ADLs  Patient Self Care Activities:  . Takes medications as prescribed . Attends scheduled medical appointments  Plan:   Client to attend scheduled client medical appointments LCSW to call client or her daughter in next 4 weeks to talk with client or her Daughter about financial needs of client and client procurement of incontinent supplies Client to talk with [facility] regarding nursing needs of client  Initial goal documentation      Follow Up  Plan: LCSW to call client or her daughter in the next 4 weeks to talk with client/daughter about financial needs of client and about client procurement of incontinence supplies for client  Mallory Steele.Mallory Steele MSW, LCSW Licensed Clinical Social Worker [facility] Care Management [phone]

## 2020-03-28 NOTE — Patient Instructions (Addendum)
Licensed Clinical Social Worker Visit Information  Goals we discussed today:  Goals    . Client said she has financial challenges and it is hard to pay for incontinent supplies montylyi (pt-stated)     Current Barriers:  Marland Kitchen Mental Health Challenges of client with chronic diagnoses of GERD, Anxiety, Dementia, Depression, and Insomnia . Financial challenges  Clinical Social Work Clinical Goal(s):  . Client and LCSW will communicate in next 30 days to talk about financial needs of client.  Interventions:  Talked with client about CCM program services.  Talked with client about CCM nursing support with RNCM  Talked previously with client about financial needs of client  Talked with client about her frequent use of incontinent supplies and difficulty in              paying for these supplies  Talked with client about ambulation needs of client Talked with client about pain issues of client Talked with client about client's upcoming medical appointments Talked with client about client completion of ADLs  Patient Self Care Activities:  . Takes medications as prescribed . Attends scheduled medical appointments  Plan:   Client to attend scheduled client medical appointments LCSW to call client or her daughter in next 4 weeks to talk with client or her Daughter about financial needs of client and client procurement of incontinent supplies Client to talk with [facility] regarding nursing needs of client  Initial goal documentation       Materials Provided: No  Follow Up Plan: LCSW to call client or her daughter in next 4 weeks to talk with client/daughter about financial needs of client and about client procurement of incontinence supplies for client  The patient verbalized understanding of instructions provided today and declined a print copy of patient instruction materials.   [name].Aidyn Kellis MSW, LCSW Licensed Clinical Social Worker [facility]  [facility] [phone]

## 2020-04-16 ENCOUNTER — Ambulatory Visit (INDEPENDENT_AMBULATORY_CARE_PROVIDER_SITE_OTHER): Payer: Medicare HMO | Admitting: Family Medicine

## 2020-04-16 DIAGNOSIS — R935 Abnormal findings on diagnostic imaging of other abdominal regions, including retroperitoneum: Secondary | ICD-10-CM | POA: Diagnosis not present

## 2020-04-16 DIAGNOSIS — Z87891 Personal history of nicotine dependence: Secondary | ICD-10-CM | POA: Diagnosis not present

## 2020-04-16 DIAGNOSIS — F0390 Unspecified dementia without behavioral disturbance: Secondary | ICD-10-CM | POA: Diagnosis not present

## 2020-04-16 DIAGNOSIS — G8929 Other chronic pain: Secondary | ICD-10-CM | POA: Diagnosis not present

## 2020-04-16 DIAGNOSIS — Z9049 Acquired absence of other specified parts of digestive tract: Secondary | ICD-10-CM | POA: Diagnosis not present

## 2020-04-16 DIAGNOSIS — R1030 Lower abdominal pain, unspecified: Secondary | ICD-10-CM

## 2020-04-16 DIAGNOSIS — R079 Chest pain, unspecified: Secondary | ICD-10-CM | POA: Diagnosis not present

## 2020-04-16 DIAGNOSIS — K859 Acute pancreatitis without necrosis or infection, unspecified: Secondary | ICD-10-CM | POA: Diagnosis not present

## 2020-04-16 DIAGNOSIS — M545 Low back pain, unspecified: Secondary | ICD-10-CM

## 2020-04-16 DIAGNOSIS — R109 Unspecified abdominal pain: Secondary | ICD-10-CM | POA: Diagnosis not present

## 2020-04-16 NOTE — Progress Notes (Signed)
Subjective:    Patient ID: Mallory Steele, female    DOB: Feb 26, 1932, 86 y.o.   MRN: [id number]   HPI: Mallory Steele is a 86 y.o. female presenting for swollen  And painful stomach below belly button. No NVD. Can hardly walk due to pain. In 2000 and 2010 had surgeries on stomach for blockages. Gall bladder removed two years ago. Having urinary frequency for several days. Pain in middle of the back radiating to the side.  No nausea, vomiting. No fever but she has had chills this week. Onset two weeks ago. Much worse this morning.     Depression screen [facility] 2/9 09/10/2019 06/15/2019 12/22/2018 12/22/2018 09/18/2018  Decreased Interest 0 3 1 0 0  Down, Depressed, Hopeless 1 2 1  0 0  PHQ - 2 Score 1 5 2  0 0  Altered sleeping - 0 0 - -  Tired, decreased energy - 1 1 - -  Change in appetite - 0 1 - -  Feeling bad or failure about yourself  - 1 0 - -  Trouble concentrating - 2 1 - -  Moving slowly or fidgety/restless - 1 0 - -  Suicidal thoughts - 0 0 - -  PHQ-9 Score - 10 5 - -  Difficult doing work/chores - Somewhat difficult - - -  Some recent data might be hidden     Relevant past medical, surgical, family and social history reviewed and updated as indicated.  Interim medical history since our last visit reviewed. Allergies and medications reviewed and updated.  ROS:  Review of Systems  Constitutional: Positive for activity change, chills and fatigue.  HENT: Negative.   Eyes: Negative.   Respiratory: Negative for shortness of breath.   Cardiovascular: Negative for chest pain.  Gastrointestinal: Positive for abdominal pain.  Genitourinary: Positive for flank pain and frequency.  Musculoskeletal: Positive for back pain and gait problem (can't walk due to back pain). Negative for arthralgias.     Social History   Tobacco Use  Smoking Status Former Smoker  . Packs/day: 0.25  . Years: 3.00  . Pack years: 0.75  . Quit date: 09/22/1985  . Years since quitting: 34.5  Smokeless  Tobacco Never Used       Objective:     Wt Readings from Last 3 Encounters:  01/14/20 170 lb 12.8 oz (77.5 kg)  09/10/19 170 lb (77.1 kg)  12/22/18 170 lb (77.1 kg)     Exam deferred. Pt. Harboring due to COVID 19. Phone visit performed.   Assessment & Plan:   1. Lower abdominal pain   2. Acute midline low back pain without sciatica     Due to the patient's history of multiple blockages sounding like small bowel obstructions from description, I recommended that she be seen in the emergency room. She may have further blockage for which x-ray and/or CT scan may be necessary. Additionally she needs some confirmation as to whether this is simply bladder infection. With the bloated sensation I cannot rule out intestinal issues as opposed to cystitis. Therefore she was recommended to go to [facility] emergency department for definitive evaluation. I spoke to her daughter who is in agreement and will get a written intake for now.      Virtual Visit via telephone Note  I discussed the limitations, risks, security and privacy concerns of performing an evaluation and management service by telephone and the availability of in person appointments. The patient was identified with two identifiers. Pt.expressed understanding  and agreed to proceed. Pt. Is at home. Dr. Livia Snellen is in his office.  Follow Up Instructions:   I discussed the assessment and treatment plan with the patient. The patient was provided an opportunity to ask questions and all were answered. The patient agreed with the plan and demonstrated an understanding of the instructions.   The patient was advised to call back or seek an in-person evaluation if the symptoms worsen or if the condition fails to improve as anticipated.   Total minutes including chart review and phone contact time: 18   Follow up plan: No follow-ups on file.  Claretta Fraise, MD [facility]

## 2020-04-24 ENCOUNTER — Ambulatory Visit: Payer: Medicare HMO

## 2020-04-29 ENCOUNTER — Ambulatory Visit: Payer: Self-pay | Admitting: Licensed Clinical Social Worker

## 2020-04-29 DIAGNOSIS — F419 Anxiety disorder, unspecified: Secondary | ICD-10-CM

## 2020-04-29 DIAGNOSIS — F331 Major depressive disorder, recurrent, moderate: Secondary | ICD-10-CM

## 2020-04-29 DIAGNOSIS — F02818 Dementia in other diseases classified elsewhere, unspecified severity, with other behavioral disturbance: Secondary | ICD-10-CM

## 2020-04-29 DIAGNOSIS — K219 Gastro-esophageal reflux disease without esophagitis: Secondary | ICD-10-CM

## 2020-04-29 NOTE — Patient Instructions (Addendum)
Licensed Clinical Social Worker Visit Information  Materials Provided: No  04/29/2020   Name: Mallory Steele MRN: [id number] DOB: 10-30-1932   Mallory Steele is a 86 y.o. year old female who is a primary care patient of Dettinger, Fransisca Kaufmann, MD. The CCM team was consulted for assistance with [name] .   Review of patient status, including review of consultants reports, other relevant assessments, and collaboration with appropriate care team members and the patient's provider was performed as part of comprehensive patient evaluation and provision of chronic care management services.   SDOH (Social Determinants of Health) assessments performed: No;risk for tobacco use; risk for depression  LCSW called client home phone number today and was not able to speak via phone with client; however, LCSW did speak with daughter of client today. Daughter of client said that this was not a good time to speak with client since client had quests visiting with her. Daughter asked LCSW to call client again at a different day and time.  Follow Up Plan: LCSW to call client or her daughter in the next 4 weeks to talk with client/daughter about financial needs of client and about client procurement of incontinence supplies for client  The patient/daughter verbalized understanding of instructions provided today and declined a print copy of patient instruction materials.   Mallory Steele.Mallory Steele MSW, LCSW Licensed Clinical Social Worker [facility] [phone]

## 2020-04-29 NOTE — Chronic Care Management (AMB) (Signed)
  Chronic Care Management    Clinical Social Work Follow Up Note  04/29/2020 Name: Mallory Steele MRN: [id number] DOB: 1932/05/24  Mallory Steele is a 86 y.o. year old female who is a primary care patient of Dettinger, Fransisca Kaufmann, MD. The CCM team was consulted for assistance with Mallory Steele .   Review of patient status, including review of consultants reports, other relevant assessments, and collaboration with appropriate care team members and the patient's provider was performed as part of comprehensive patient evaluation and provision of chronic care management services.    SDOH (Social Determinants of Health) assessments performed: No;risk for tobacco use; risk for depression    Chronic Care Management from 06/15/2019 in [facility]  PHQ-9 Total Score  10     GAD 7 : Generalized Anxiety Score 06/15/2019  Nervous, Anxious, on Edge 1  Control/stop worrying 3  Worry too much - different things 1  Trouble relaxing 0  Restless 0  Easily annoyed or irritable 1  Afraid - awful might happen 0  Total GAD 7 Score 6  Anxiety Difficulty Somewhat difficult    Outpatient Encounter Medications as of 04/29/2020  Medication Sig  . clobetasol cream (TEMOVATE) 3.15 % Apply 1 application topically 2 (two) times daily.  Marland Kitchen donepezil (ARICEPT) 10 MG tablet Take 1 tablet (10 mg total) by mouth daily.  . DULoxetine (CYMBALTA) 60 MG capsule Take 2 capsules (120 mg total) by mouth daily. (Needs to be seen before next refill)  . ferrous sulfate 325 (65 FE) MG tablet Take 1 tablet (325 mg total) by mouth daily with breakfast.  . FOLIC ACID PO Take by mouth.  . Incontinence Supply Disposable (DEPEND UNDERWEAR LARGE) MISC 1 each by Does not apply route 4 (four) times daily as needed.  . meclizine (ANTIVERT) 25 MG tablet TAKE 1 TABLET (25 MG TOTAL) BY MOUTH 3 (THREE) TIMES DAILY AS NEEDED FOR DIZZINESS.  . methotrexate (RHEUMATREX) 2.5 MG tablet   . omeprazole (PRILOSEC) 20 MG capsule  Take 1 capsule (20 mg total) by mouth daily.  . risperiDONE (RISPERDAL) 0.5 MG tablet Take 1 tablet (0.5 mg total) by mouth 2 (two) times daily.  Marland Kitchen rOPINIRole (REQUIP) 0.25 MG tablet Take 1 tablet (0.25 mg total) by mouth 3 (three) times daily.  . traZODone (DESYREL) 50 MG tablet Take 1-2 tablets (50-100 mg total) by mouth at bedtime as needed for sleep.  . vitamin B-12 (CYANOCOBALAMIN) 250 MCG tablet TAKE 1 TABLET BY MOUTH EVERY DAY   No facility-administered encounter medications on file as of 04/29/2020.    LCSW called client home phone number today and was not able to speak via phone with client; however, LCSW did speak with daughter of client today. Daughter of client said that this was not a good time to speak with client since client had quests visiting with her. Daughter asked LCSW to call client again at a different day and time.  Follow Up Plan: LCSW to call client or her daughter in the next 4 weeks to talk with client/daughter about financial needs of client and about client procurement of incontinence supplies for client  [name].[name] MSW, LCSW Licensed Clinical Social Worker [facility] [phone]

## 2020-05-22 ENCOUNTER — Telehealth: Payer: Self-pay | Admitting: *Deleted

## 2020-05-22 NOTE — Telephone Encounter (Signed)
Requesting rx for Zinc 50 mg po daily. Not on current med list.

## 2020-05-23 MED ORDER — ZINC GLUCONATE 50 MG PO TABS: 50.0000 mg | ORAL_TABLET | Freq: Every day | ORAL | 3 refills | Status: DC

## 2020-05-23 NOTE — Telephone Encounter (Signed)
Sent prescription for the patient

## 2020-05-23 NOTE — Telephone Encounter (Signed)
Patient aware, script is ready. 

## 2020-06-03 ENCOUNTER — Telehealth: Payer: Self-pay

## 2020-06-27 ENCOUNTER — Telehealth: Payer: Self-pay | Admitting: Family Medicine

## 2020-06-27 NOTE — Telephone Encounter (Signed)
Pt scheduled with Dr Warrick Parisian 07/11/20 at 11:25.

## 2020-06-27 NOTE — Telephone Encounter (Signed)
Pts daughter would like to speak with the nurse regarding her moms alzheimers and possibly increasing medication.

## 2020-07-07 ENCOUNTER — Ambulatory Visit (INDEPENDENT_AMBULATORY_CARE_PROVIDER_SITE_OTHER): Payer: Medicare HMO | Admitting: Licensed Clinical Social Worker

## 2020-07-07 DIAGNOSIS — G301 Alzheimer's disease with late onset: Secondary | ICD-10-CM | POA: Diagnosis not present

## 2020-07-07 DIAGNOSIS — F331 Major depressive disorder, recurrent, moderate: Secondary | ICD-10-CM | POA: Diagnosis not present

## 2020-07-07 DIAGNOSIS — F0281 Dementia in other diseases classified elsewhere with behavioral disturbance: Secondary | ICD-10-CM

## 2020-07-07 DIAGNOSIS — K219 Gastro-esophageal reflux disease without esophagitis: Secondary | ICD-10-CM

## 2020-07-07 DIAGNOSIS — F419 Anxiety disorder, unspecified: Secondary | ICD-10-CM

## 2020-07-07 DIAGNOSIS — F02818 Dementia in other diseases classified elsewhere, unspecified severity, with other behavioral disturbance: Secondary | ICD-10-CM

## 2020-07-07 NOTE — Chronic Care Management (AMB) (Signed)
Chronic Care Management    Clinical Social Work Follow Up Note  07/07/2020 Name: Mallory Steele MRN: [id number] DOB: 06/28/1932  Mallory Steele is a 86 y.o. year old female who is a primary care patient of Dettinger, Fransisca Kaufmann, MD. The CCM team was consulted for assistance with Mallory Steele .   Review of patient status, including review of consultants reports, other relevant assessments, and collaboration with appropriate care team members and the patient's provider was performed as part of comprehensive patient evaluation and provision of chronic care management services.    SDOH (Social Determinants of Health) assessments performed: No;risk for tobacco use; risk for depression; risk for physical inactivity     Chronic Care Management from 06/15/2019 in [facility]  PHQ-9 Total Score 10     GAD 7 : Generalized Anxiety Score 06/15/2019  Nervous, Anxious, on Edge 1  Control/stop worrying 3  Worry too much - different things 1  Trouble relaxing 0  Restless 0  Easily annoyed or irritable 1  Afraid - awful might happen 0  Total GAD 7 Score 6  Anxiety Difficulty Somewhat difficult    Outpatient Encounter Medications as of 07/07/2020  Medication Sig  . clobetasol cream (TEMOVATE) 8.78 % Apply 1 application topically 2 (two) times daily.  Marland Kitchen donepezil (ARICEPT) 10 MG tablet Take 1 tablet (10 mg total) by mouth daily.  . DULoxetine (CYMBALTA) 60 MG capsule Take 2 capsules (120 mg total) by mouth daily. (Needs to be seen before next refill)  . ferrous sulfate 325 (65 FE) MG tablet Take 1 tablet (325 mg total) by mouth daily with breakfast.  . FOLIC ACID PO Take by mouth.  . Incontinence Supply Disposable (DEPEND UNDERWEAR LARGE) MISC 1 each by Does not apply route 4 (four) times daily as needed.  . meclizine (ANTIVERT) 25 MG tablet TAKE 1 TABLET (25 MG TOTAL) BY MOUTH 3 (THREE) TIMES DAILY AS NEEDED FOR DIZZINESS.  . methotrexate (RHEUMATREX) 2.5 MG tablet   .  omeprazole (PRILOSEC) 20 MG capsule Take 1 capsule (20 mg total) by mouth daily.  . risperiDONE (RISPERDAL) 0.5 MG tablet Take 1 tablet (0.5 mg total) by mouth 2 (two) times daily.  Marland Kitchen rOPINIRole (REQUIP) 0.25 MG tablet Take 1 tablet (0.25 mg total) by mouth 3 (three) times daily.  . traZODone (DESYREL) 50 MG tablet Take 1-2 tablets (50-100 mg total) by mouth at bedtime as needed for sleep.  . vitamin B-12 (CYANOCOBALAMIN) 250 MCG tablet TAKE 1 TABLET BY MOUTH EVERY DAY  . zinc gluconate 50 MG tablet Take 1 tablet (50 mg total) by mouth daily.   No facility-administered encounter medications on file as of 07/07/2020.    Goals    .  Client said she has financial challenges and it is hard to pay for incontinent supplies montylyi (pt-stated)      Current Barriers:  Marland Kitchen Mental Health Challenges of client with chronic diagnoses of GERD, Anxiety, Dementia, Depression, and Insomnia . Financial challenges  Clinical Social Work Clinical Goal(s):  . Client and LCSW will communicate in next 30 days to talk about financial needs of client.  Interventions:  Talked with Mallory Steele about CCM nursing support with RNCM  Talked with Mallory Steele about client frequent use of incontinent supplies and difficulty in          paying for these supplies  Talked with Mallory Steele about ambulation needs of client  Talked with Mallory Steele about pain issues of client  Talked with Mallory Steele about  client's upcoming medical appointments  Talked with Mallory Steele about behavior issues of client and about memory issues of client  Talked with Mallory Steele about anger issues of client  Talked with Mallory Steele about sleeping issues of client  Talked with Mallory Steele about hearing issues of client  Provided counseling support with Mallory Steele regarding meeting care needs of Mallory Steele    Patient Self Care Activities:  . Takes medications as prescribed . Attends scheduled medical appointments  Plan:   Client to attend scheduled client medical  appointments LCSW to call client or her Mallory Steele in next 4 weeks to talk with client or her Mallory Steele about financial needs of client and client procurement of incontinent supplies Client to talk with [facility] regarding nursing needs of client  Initial goal documentation        Follow Up Plan: LCSW to call client or her Mallory Steele in the next 4 weeks to talk with client/Mallory Steele about financial needs of client and about client procurement of incontinence supplies for client  Mallory Steele.Mallory Steele MSW, LCSW Licensed Clinical Social Worker [facility] [phone]

## 2020-07-07 NOTE — Patient Instructions (Addendum)
Licensed Clinical Social Worker Visit Information  Goals:    .  Client said she has financial challenges and it is hard to pay for incontinent supplies montylyi (pt-stated)         Current Barriers:   Mental Health Challenges of client with chronic diagnoses of GERD, Anxiety, Dementia, Depression, and Insomnia  Financial challenges  Clinical Social Work Clinical Goal(s):   Client and LCSW will communicate in next 30 days to talk about financial needs of client.  Interventions:  Talked with [name] about CCM nursing support with RNCM  Talked with [name] about client frequent use of incontinent supplies and difficulty in  paying for these supplies  Talked with [name] about ambulation needs of client  Talked with [name] about pain issues of client  Talked with [name] about client's upcoming medical appointments  Talked with [name] about behavior issues of client and about memory issues of client  Talked with [name] about anger issues of client  Talked with [name] about sleeping issues of client  Talked with [name] about hearing issues of client  Provided counseling support with [name] regarding meeting care needs of [name]  Patient Self Care Activities:   Takes medications as prescribed  Attends scheduled medical appointments  Plan:   Client to attend scheduled client medical appointments LCSW to call client or her daughter in next 4 weeks to talk with client or her Daughter about financial needs of client and client procurement of incontinent supplies Client to talk with [facility] regarding nursing needs of client  Initial goal documentation        Follow Up Plan: LCSW to call client or her daughter in the next 4 weeks to talk with client/daughter about financial needs of client and about client procurement of incontinence supplies for client  Materials Provided: No  The patient Korianna Washer, daughter of patient verbalized  understanding of instructions provided today and declined a print copy of patient instruction materials.   [name].Durene Dodge MSW, LCSW Licensed Clinical Social Worker [facility] [phone]

## 2020-07-11 ENCOUNTER — Ambulatory Visit (INDEPENDENT_AMBULATORY_CARE_PROVIDER_SITE_OTHER): Payer: Medicare HMO | Admitting: Family Medicine

## 2020-07-11 ENCOUNTER — Other Ambulatory Visit: Payer: Self-pay

## 2020-07-11 ENCOUNTER — Encounter: Payer: Self-pay | Admitting: Family Medicine

## 2020-07-11 VITALS — BP 126/71 | HR 70 | Temp 98.4°F | Ht 65.0 in | Wt 168.0 lb

## 2020-07-11 DIAGNOSIS — F331 Major depressive disorder, recurrent, moderate: Secondary | ICD-10-CM

## 2020-07-11 DIAGNOSIS — K219 Gastro-esophageal reflux disease without esophagitis: Secondary | ICD-10-CM

## 2020-07-11 DIAGNOSIS — F0281 Dementia in other diseases classified elsewhere with behavioral disturbance: Secondary | ICD-10-CM | POA: Diagnosis not present

## 2020-07-11 DIAGNOSIS — G301 Alzheimer's disease with late onset: Secondary | ICD-10-CM

## 2020-07-11 DIAGNOSIS — F419 Anxiety disorder, unspecified: Secondary | ICD-10-CM | POA: Diagnosis not present

## 2020-07-11 DIAGNOSIS — F02818 Dementia in other diseases classified elsewhere, unspecified severity, with other behavioral disturbance: Secondary | ICD-10-CM

## 2020-07-11 DIAGNOSIS — G309 Alzheimer's disease, unspecified: Secondary | ICD-10-CM

## 2020-07-11 DIAGNOSIS — F0391 Unspecified dementia with behavioral disturbance: Secondary | ICD-10-CM

## 2020-07-11 MED ORDER — RISPERIDONE 1 MG PO TABS: 1.0000 mg | ORAL_TABLET | Freq: Two times a day (BID) | ORAL | 1 refills | Status: DC

## 2020-07-11 NOTE — Progress Notes (Signed)
BP 126/71   Pulse 70   Temp 98.4 F (36.9 C)   Ht '5\' 5"'  (1.651 m)   Wt 168 lb (76.2 kg)   SpO2 97%   BMI 27.96 kg/m    Subjective:   Patient ID: Mallory Steele, female    DOB: 03/28/1932, 86 y.o.   MRN: [id number]  HPI: Mallory Steele is a 86 y.o. female presenting on 07/11/2020 for Medical Management of Chronic Issues (66M)   HPI GERD Patient is currently on omeprazole.  She denies any major symptoms or abdominal pain or belching or burping. She denies any blood in her stool or lightheadedness or dizziness.   Depression and anxiety and Alzheimer's Patient is coming in for recheck of depression anxiety and insomnia.  She is currently been taking ropinirole and trazodone and risperidone and donepezil.  Her family says that her memory is worsening and also her irritation and anxiety and anger and being upset.  There having a lot more behavioral issues with this and I wondered if something else can help more with that.  Her memory is also been worsening significantly over the past few months.  Relevant past medical, surgical, family and social history reviewed and updated as indicated. Interim medical history since our last visit reviewed. Allergies and medications reviewed and updated.  Review of Systems  Constitutional: Negative for chills and fever.  Eyes: Negative for visual disturbance.  Respiratory: Negative for chest tightness and shortness of breath.   Cardiovascular: Negative for chest pain and leg swelling.  Musculoskeletal: Negative for back pain and gait problem.  Skin: Negative for rash.  Neurological: Negative for light-headedness and headaches.  Psychiatric/Behavioral: Positive for confusion, decreased concentration, dysphoric mood and sleep disturbance. Negative for agitation, behavioral problems, self-injury and suicidal ideas. The patient is nervous/anxious.   All other systems reviewed and are negative.   Per HPI unless specifically indicated  above   Allergies as of 07/11/2020   No Known Allergies     Medication List       Accurate as of July 11, 2020 12:11 PM. If you have any questions, ask your nurse or doctor.        STOP taking these medications   methotrexate 2.5 MG tablet Commonly known as: RHEUMATREX Stopped by: Fransisca Kaufmann Gerene Nedd, MD     TAKE these medications   clobetasol cream 0.05 % Commonly known as: TEMOVATE Apply 1 application topically 2 (two) times daily.   Depend Underwear Large Misc 1 each by Does not apply route 4 (four) times daily as needed.   donepezil 10 MG tablet Commonly known as: ARICEPT Take 1 tablet (10 mg total) by mouth daily.   DULoxetine 60 MG capsule Commonly known as: CYMBALTA Take 2 capsules (120 mg total) by mouth daily. (Needs to be seen before next refill)   ferrous sulfate 325 (65 FE) MG tablet Take 1 tablet (325 mg total) by mouth daily with breakfast.   FOLIC ACID PO Take by mouth.   meclizine 25 MG tablet Commonly known as: ANTIVERT TAKE 1 TABLET (25 MG TOTAL) BY MOUTH 3 (THREE) TIMES DAILY AS NEEDED FOR DIZZINESS.   omeprazole 20 MG capsule Commonly known as: PRILOSEC Take 1 capsule (20 mg total) by mouth daily.   risperiDONE 0.5 MG tablet Commonly known as: RISPERDAL Take 1 tablet (0.5 mg total) by mouth 2 (two) times daily.   rOPINIRole 0.25 MG tablet Commonly known as: REQUIP Take 1 tablet (0.25 mg total) by mouth 3 (three) times  daily.   traZODone 50 MG tablet Commonly known as: DESYREL Take 1-2 tablets (50-100 mg total) by mouth at bedtime as needed for sleep.   vitamin B-12 250 MCG tablet Commonly known as: CYANOCOBALAMIN TAKE 1 TABLET BY MOUTH EVERY DAY   zinc gluconate 50 MG tablet Take 1 tablet (50 mg total) by mouth daily.        Objective:   BP 126/71   Pulse 70   Temp 98.4 F (36.9 C)   Ht '5\' 5"'  (1.651 m)   Wt 168 lb (76.2 kg)   SpO2 97%   BMI 27.96 kg/m   Wt Readings from Last 3 Encounters:  07/11/20 168 lb (76.2  kg)  01/14/20 170 lb 12.8 oz (77.5 kg)  09/10/19 170 lb (77.1 kg)    Physical Exam Vitals and nursing note reviewed.  Constitutional:      General: She is not in acute distress.    Appearance: She is well-developed. She is not diaphoretic.  Eyes:     Conjunctiva/sclera: Conjunctivae normal.  Cardiovascular:     Rate and Rhythm: Normal rate and regular rhythm.     Heart sounds: Normal heart sounds. No murmur heard.   Pulmonary:     Effort: Pulmonary effort is normal. No respiratory distress.     Breath sounds: Normal breath sounds. No wheezing.  Musculoskeletal:        General: No tenderness. Normal range of motion.  Skin:    General: Skin is warm and dry.     Findings: No rash.  Neurological:     Mental Status: She is alert and oriented to person, place, and time.     Coordination: Coordination normal.  Psychiatric:        Mood and Affect: Mood is anxious and depressed.        Behavior: Behavior normal.        Thought Content: Thought content does not include suicidal ideation. Thought content does not include suicidal plan.       Assessment & Plan:   Problem List Items Addressed This Visit      Digestive   GERD without esophagitis - Primary   Relevant Orders   CMP14+EGFR (Completed)   CBC with Differential/Platelet (Completed)     Nervous and Auditory   Dementia (HCC)   Relevant Medications   risperiDONE (RISPERDAL) 1 MG tablet   Other Relevant Orders   CMP14+EGFR (Completed)   CBC with Differential/Platelet (Completed)     Other   Depression   Relevant Medications   risperiDONE (RISPERDAL) 1 MG tablet   Anxiety   Relevant Medications   risperiDONE (RISPERDAL) 1 MG tablet    Other Visit Diagnoses    Dementia with behavioral disturbance       Relevant Medications   risperiDONE (RISPERDAL) 1 MG tablet   Other Relevant Orders   CMP14+EGFR (Completed)   CBC with Differential/Platelet (Completed)      Will increase Risperdal, discussed the difficulty  of this with the elderly and black box warning with this medicine, we discussed other options and have come to the conclusion this is probably the most logical for them at this point.  Will increase just slightly and see if that helps significantly with her mood.  Follow up plan: Return in about 6 months (around 01/11/2021), or if symptoms worsen or fail to improve, for depression and dementia.  Counseling provided for all of the vaccine components No orders of the defined types were placed in this encounter.  Caryl Pina, MD [facility] 07/11/2020, 12:11 PM

## 2020-07-12 LAB — CBC WITH DIFFERENTIAL/PLATELET
Basophils Absolute: 0 10*3/uL (ref 0.0–0.2)
Basos: 0 %
EOS (ABSOLUTE): 0 10*3/uL (ref 0.0–0.4)
Eos: 1 %
Hematocrit: 38.4 % (ref 34.0–46.6)
Hemoglobin: 12.7 g/dL (ref 11.1–15.9)
Immature Grans (Abs): 0 10*3/uL (ref 0.0–0.1)
Immature Granulocytes: 0 %
Lymphocytes Absolute: 1.9 10*3/uL (ref 0.7–3.1)
Lymphs: 33 %
MCH: 31 pg (ref 26.6–33.0)
MCHC: 33.1 g/dL (ref 31.5–35.7)
MCV: 94 fL (ref 79–97)
Monocytes Absolute: 0.6 10*3/uL (ref 0.1–0.9)
Monocytes: 11 %
Neutrophils Absolute: 3.1 10*3/uL (ref 1.4–7.0)
Neutrophils: 55 %
Platelets: 148 10*3/uL — ABNORMAL LOW (ref 150–450)
RBC: 4.1 x10E6/uL (ref 3.77–5.28)
RDW: 12.3 % (ref 11.7–15.4)
WBC: 5.6 10*3/uL (ref 3.4–10.8)

## 2020-07-12 LAB — CMP14+EGFR
ALT: 19 IU/L (ref 0–32)
AST: 20 IU/L (ref 0–40)
Albumin/Globulin Ratio: 1.8 (ref 1.2–2.2)
Albumin: 4.2 g/dL (ref 3.6–4.6)
Alkaline Phosphatase: 112 IU/L (ref 48–121)
BUN/Creatinine Ratio: 12 (ref 12–28)
BUN: 15 mg/dL (ref 8–27)
Bilirubin Total: 0.2 mg/dL (ref 0.0–1.2)
CO2: 25 mmol/L (ref 20–29)
Calcium: 8.9 mg/dL (ref 8.7–10.3)
Chloride: 105 mmol/L (ref 96–106)
Creatinine, Ser: 1.21 mg/dL — ABNORMAL HIGH (ref 0.57–1.00)
GFR calc Af Amer: 47 mL/min/{1.73_m2} — ABNORMAL LOW (ref >59)
GFR calc non Af Amer: 41 mL/min/{1.73_m2} — ABNORMAL LOW (ref >59)
Globulin, Total: 2.3 g/dL (ref 1.5–4.5)
Glucose: 89 mg/dL (ref 65–99)
Potassium: 4.5 mmol/L (ref 3.5–5.2)
Sodium: 141 mmol/L (ref 134–144)
Total Protein: 6.5 g/dL (ref 6.0–8.5)

## 2020-08-12 ENCOUNTER — Telehealth: Payer: Medicare HMO

## 2020-09-18 ENCOUNTER — Telehealth: Payer: Medicare HMO

## 2020-10-20 ENCOUNTER — Telehealth: Payer: Medicare HMO

## 2020-11-04 ENCOUNTER — Other Ambulatory Visit: Payer: Self-pay | Admitting: Family Medicine

## 2020-11-04 DIAGNOSIS — F03918 Unspecified dementia, unspecified severity, with other behavioral disturbance: Secondary | ICD-10-CM

## 2020-11-04 DIAGNOSIS — R252 Cramp and spasm: Secondary | ICD-10-CM

## 2020-11-26 ENCOUNTER — Telehealth: Payer: Medicare HMO

## 2020-11-26 DIAGNOSIS — Z87891 Personal history of nicotine dependence: Secondary | ICD-10-CM | POA: Diagnosis not present

## 2020-11-26 DIAGNOSIS — Z79899 Other long term (current) drug therapy: Secondary | ICD-10-CM | POA: Diagnosis not present

## 2020-11-26 DIAGNOSIS — I252 Old myocardial infarction: Secondary | ICD-10-CM | POA: Diagnosis not present

## 2020-11-26 DIAGNOSIS — R079 Chest pain, unspecified: Secondary | ICD-10-CM | POA: Diagnosis not present

## 2020-11-26 DIAGNOSIS — R0602 Shortness of breath: Secondary | ICD-10-CM | POA: Diagnosis not present

## 2020-11-26 DIAGNOSIS — U071 COVID-19: Secondary | ICD-10-CM | POA: Diagnosis not present

## 2020-11-26 DIAGNOSIS — R9431 Abnormal electrocardiogram [ECG] [EKG]: Secondary | ICD-10-CM | POA: Diagnosis not present

## 2020-12-01 ENCOUNTER — Telehealth: Payer: Self-pay

## 2020-12-01 ENCOUNTER — Ambulatory Visit: Payer: Self-pay | Admitting: Licensed Clinical Social Worker

## 2020-12-01 DIAGNOSIS — F331 Major depressive disorder, recurrent, moderate: Secondary | ICD-10-CM

## 2020-12-01 DIAGNOSIS — F419 Anxiety disorder, unspecified: Secondary | ICD-10-CM

## 2020-12-01 DIAGNOSIS — K219 Gastro-esophageal reflux disease without esophagitis: Secondary | ICD-10-CM

## 2020-12-01 DIAGNOSIS — F0281 Dementia in other diseases classified elsewhere with behavioral disturbance: Secondary | ICD-10-CM

## 2020-12-01 DIAGNOSIS — G301 Alzheimer's disease with late onset: Secondary | ICD-10-CM

## 2020-12-01 DIAGNOSIS — D509 Iron deficiency anemia, unspecified: Secondary | ICD-10-CM

## 2020-12-01 NOTE — Telephone Encounter (Signed)
Patient has tested positive for Covid and wanted to know if there was anything she needed to take to help her though the symptoms.  Mallory Steele, the clinical social worker called to ask [place] to call her).

## 2020-12-01 NOTE — Patient Instructions (Addendum)
Licensed Clinical Social Worker Visit Information  Materials Provided: No  12/01/2020  Name: Mallory Steele            MRN: [id number]       DOB: 08-08-1932  Mallory Steele is a 87 y.o. year old female who is a primary care patient of Dettinger, Mallory Kaufmann, MD. The CCM team was consulted for assistance with [name] .   Review of patient status, including review of consultants reports, other relevant assessments, and collaboration with appropriate care team members and the patient's provider was performed as part of comprehensive patient evaluation and provision of chronic care management services.    SDOH (Social Determinants of Health) assessments performed: No; risk for tobacco use; risk for depression; risk for stress; risk for physical inactivity  LCSW called Mallory Steele today and spoke with Mallory Steele and with daughter of client  Mallory Steele had tested positive for COVID 19 recently and is resting at home of her daughter, Mallory Steele. Mallory Steele she is eating well and sleeping well. She receives support from her daughter Mallory Steele. She walks with use of her walker when she leaves the home. She Steele she has her prescribed medications. She did not speak of any pain issues. Mallory Steele asked if Triage Nurse at [facility] could return call to her to discuss needs of Mallory Steele and treatment support for Mallory Steele. LCSW spoke today via phone with Mallory Steele and asked Mallory Steele to request Triage Nurse give Mallory Steele call today to discuss client recent COVID 19 positive test result  Follow Up Plan: LCSW to call client or daughter in next 4 weeks to assess needs of client at that time  The patient/Mallory Steele, daughter of patient, verbalized understanding of instructions provided today and declined a print copy of patient instruction materials.   Mallory Steele.Mallory Steele MSW, LCSW Licensed Clinical Social Worker [facility] [phone]

## 2020-12-01 NOTE — Chronic Care Management (AMB) (Signed)
Chronic Care Management    Clinical Social Work Follow Up Note  12/01/2020 Name: Mallory Steele MRN: [id number] DOB: 1932/06/02  Mallory Steele is a 87 y.o. year old female who is a primary care patient of Dettinger, Fransisca Kaufmann, MD. The CCM team was consulted for assistance with [name] .   Review of patient status, including review of consultants reports, other relevant assessments, and collaboration with appropriate care team members and the patient's provider was performed as part of comprehensive patient evaluation and provision of chronic care management services.    SDOH (Social Determinants of Health) assessments performed: No; risk for tobacco use; risk for depression; risk for stress; risk for physical inactivity  [place] Visit from 07/11/2020 in [facility]  PHQ-9 Total Score 17     GAD 7 : Generalized Anxiety Score 07/11/2020 06/15/2019  Nervous, Anxious, on Edge 1 1  Control/stop worrying 3 3  Worry too much - different things 1 1  Trouble relaxing 1 0  Restless 1 0  Easily annoyed or irritable 3 1  Afraid - awful might happen 0 0  Total GAD 7 Score 10 6  Anxiety Difficulty - Somewhat difficult    Outpatient Encounter Medications as of 12/01/2020  Medication Sig  . rOPINIRole (REQUIP) 0.25 MG tablet TAKE 1 TABLET BY MOUTH THREE TIMES A DAY  . traZODone (DESYREL) 50 MG tablet TAKE 1-2 TABLETS BY MOUTH AT BEDTIME AS NEEDED FOR SLEEP  . clobetasol cream (TEMOVATE) 1.60 % Apply 1 application topically 2 (two) times daily.  Marland Kitchen donepezil (ARICEPT) 10 MG tablet Take 1 tablet (10 mg total) by mouth daily.  . DULoxetine (CYMBALTA) 60 MG capsule Take 2 capsules (120 mg total) by mouth daily. (Needs to be seen before next refill)  . ferrous sulfate 325 (65 FE) MG tablet Take 1 tablet (325 mg total) by mouth daily with breakfast.  . FOLIC ACID PO Take by mouth.  . Incontinence Supply Disposable (DEPEND UNDERWEAR LARGE) MISC 1 each by Does  not apply route 4 (four) times daily as needed.  . meclizine (ANTIVERT) 25 MG tablet TAKE 1 TABLET (25 MG TOTAL) BY MOUTH 3 (THREE) TIMES DAILY AS NEEDED FOR DIZZINESS.  Marland Kitchen omeprazole (PRILOSEC) 20 MG capsule Take 1 capsule (20 mg total) by mouth daily.  . risperiDONE (RISPERDAL) 1 MG tablet Take 1 tablet (1 mg total) by mouth 2 (two) times daily.  . vitamin B-12 (CYANOCOBALAMIN) 250 MCG tablet TAKE 1 TABLET BY MOUTH EVERY DAY  . zinc gluconate 50 MG tablet Take 1 tablet (50 mg total) by mouth daily.   No facility-administered encounter medications on file as of 12/01/2020.    Mallory Steele called Mallory Steele today and spoke with Mallory Steele and with daughter of client  Mallory Steele had tested positive for COVID 19 recently and is resting at home of her daughter, Mallory Steele. Mallory Steele said she is eating well and sleeping well. She receives support from her daughter Mallory Steele. She walks with use of her walker when she leaves the home. She said she has her prescribed medications. She did not speak of any pain issues. Mallory Steele asked if Triage Nurse at [facility] could return call to her to discuss needs of Mallory Steele and treatment support for Mallory Steele. Mallory Steele spoke today via phone with [name] and asked [name] to request Triage Nurse give Evon Dejarnett call today to discuss client recent COVID 19 positive test result  Follow Up Plan: Mallory Steele to call client or  daughter in next 4 weeks to assess needs of client at that time  [name].Denyla Cortese MSW, Mallory Steele Licensed Clinical Social Worker [facility] Care [facility] [phone]

## 2020-12-03 ENCOUNTER — Other Ambulatory Visit: Payer: Self-pay | Admitting: Family Medicine

## 2020-12-03 DIAGNOSIS — F0391 Unspecified dementia with behavioral disturbance: Secondary | ICD-10-CM

## 2020-12-03 DIAGNOSIS — F03918 Unspecified dementia, unspecified severity, with other behavioral disturbance: Secondary | ICD-10-CM

## 2020-12-03 DIAGNOSIS — F419 Anxiety disorder, unspecified: Secondary | ICD-10-CM

## 2020-12-03 DIAGNOSIS — F331 Major depressive disorder, recurrent, moderate: Secondary | ICD-10-CM

## 2020-12-03 NOTE — Telephone Encounter (Signed)
Dettinger. NTBS 30 days given 12/19/18 

## 2020-12-03 NOTE — Telephone Encounter (Signed)
Attempted to contact - NA 

## 2020-12-04 ENCOUNTER — Other Ambulatory Visit: Payer: Self-pay | Admitting: Family Medicine

## 2020-12-04 ENCOUNTER — Telehealth: Payer: Self-pay | Admitting: Family Medicine

## 2020-12-04 DIAGNOSIS — F0391 Unspecified dementia with behavioral disturbance: Secondary | ICD-10-CM

## 2020-12-04 DIAGNOSIS — F419 Anxiety disorder, unspecified: Secondary | ICD-10-CM

## 2020-12-04 DIAGNOSIS — K219 Gastro-esophageal reflux disease without esophagitis: Secondary | ICD-10-CM

## 2020-12-04 DIAGNOSIS — F03918 Unspecified dementia, unspecified severity, with other behavioral disturbance: Secondary | ICD-10-CM

## 2020-12-04 NOTE — Telephone Encounter (Signed)
Daughter aware patient will need  [facility] F2F appointment. Will call back to schedule.

## 2020-12-04 NOTE — Telephone Encounter (Signed)
pts daughter is calling because she would like for her mom to have someone come out to sit with her for part of the day and would like to know if she can be referred to someone who can do this

## 2020-12-08 NOTE — Telephone Encounter (Signed)
Lmtcb Patient has not returned calls  This encounter will be closed.

## 2020-12-22 ENCOUNTER — Other Ambulatory Visit: Payer: Self-pay | Admitting: Family Medicine

## 2020-12-22 DIAGNOSIS — D509 Iron deficiency anemia, unspecified: Secondary | ICD-10-CM

## 2020-12-29 ENCOUNTER — Telehealth: Payer: Medicare HMO

## 2020-12-29 DIAGNOSIS — Z79899 Other long term (current) drug therapy: Secondary | ICD-10-CM | POA: Diagnosis not present

## 2020-12-29 DIAGNOSIS — L409 Psoriasis, unspecified: Secondary | ICD-10-CM | POA: Diagnosis not present

## 2021-01-01 ENCOUNTER — Telehealth: Payer: Medicare HMO

## 2021-01-12 ENCOUNTER — Ambulatory Visit: Payer: Medicare HMO | Admitting: Family Medicine

## 2021-01-12 ENCOUNTER — Encounter: Payer: Self-pay | Admitting: Family Medicine

## 2021-01-13 ENCOUNTER — Ambulatory Visit: Payer: Medicare HMO | Admitting: Licensed Clinical Social Worker

## 2021-01-13 DIAGNOSIS — F02818 Dementia in other diseases classified elsewhere, unspecified severity, with other behavioral disturbance: Secondary | ICD-10-CM

## 2021-01-13 DIAGNOSIS — F419 Anxiety disorder, unspecified: Secondary | ICD-10-CM

## 2021-01-13 DIAGNOSIS — G301 Alzheimer's disease with late onset: Secondary | ICD-10-CM

## 2021-01-13 DIAGNOSIS — F331 Major depressive disorder, recurrent, moderate: Secondary | ICD-10-CM

## 2021-01-13 DIAGNOSIS — K219 Gastro-esophageal reflux disease without esophagitis: Secondary | ICD-10-CM

## 2021-01-13 NOTE — Chronic Care Management (AMB) (Signed)
  Chronic Care Management    Clinical Social Work Follow Up Note  01/13/2021 Name: Mallory Steele MRN: [id number] DOB: [birth year]  Mallory Steele is a 87 y.o. year old female who is a primary care patient of Dettinger, Fransisca Kaufmann, MD. The CCM team was consulted for assistance with [name] .   Review of patient status, including review of consultants reports, other relevant assessments, and collaboration with appropriate care team members and the patient's provider was performed as part of comprehensive patient evaluation and provision of chronic care management services.    SDOH (Social Determinants of Health) assessments performed: No; risk for depression; risk for tobacco use; risk for stress; risk for physical inactivity  [place] Visit from 07/11/2020 in [place]  PHQ-9 Total Score 17     GAD 7 : Generalized Anxiety Score 07/11/2020 06/15/2019  Nervous, Anxious, on Edge 1 1  Control/stop worrying 3 3  Worry too much - different things 1 1  Trouble relaxing 1 0  Restless 1 0  Easily annoyed or irritable 3 1  Afraid - awful might happen 0 0  Total GAD 7 Score 10 6  Anxiety Difficulty - Somewhat difficult    Outpatient Encounter Medications as of 01/13/2021  Medication Sig  . rOPINIRole (REQUIP) 0.25 MG tablet TAKE 1 TABLET BY MOUTH THREE TIMES A DAY  . traZODone (DESYREL) 50 MG tablet TAKE 1-2 TABLETS BY MOUTH AT BEDTIME AS NEEDED FOR SLEEP  . clobetasol cream (TEMOVATE) 2.75 % Apply 1 application topically 2 (two) times daily.  Marland Kitchen donepezil (ARICEPT) 10 MG tablet TAKE 1 TABLET BY MOUTH EVERY DAY  . DULoxetine (CYMBALTA) 60 MG capsule Take 2 capsules (120 mg total) by mouth daily. (Needs to be seen before next refill)  . ferrous sulfate 325 (65 FE) MG tablet TAKE 1 TABLET BY MOUTH EVERY DAY WITH BREAKFAST  . FOLIC ACID PO Take by mouth.  . Incontinence Supply Disposable (DEPEND UNDERWEAR LARGE) MISC 1 each by Does not apply route 4 (four)  times daily as needed.  . meclizine (ANTIVERT) 25 MG tablet TAKE 1 TABLET (25 MG TOTAL) BY MOUTH 3 (THREE) TIMES DAILY AS NEEDED FOR DIZZINESS.  Marland Kitchen omeprazole (PRILOSEC) 20 MG capsule TAKE 1 CAPSULE BY MOUTH EVERY DAY  . risperiDONE (RISPERDAL) 1 MG tablet Take 1 tablet (1 mg total) by mouth 2 (two) times daily.  . vitamin B-12 (CYANOCOBALAMIN) 250 MCG tablet TAKE 1 TABLET BY MOUTH EVERY DAY  . zinc gluconate 50 MG tablet Take 1 tablet (50 mg total) by mouth daily.   No facility-administered encounter medications on file as of 01/13/2021.    LCSW had scheduled client for phone call today from LCSW. However, upon chart review, no phone numbers were listed for client or for Mallory Steele, daughter.  LCSW will consult with RNCM Chong Sicilian about this issue to see if Mallory Steele knows of an accurate phone number for client that can be used by CCM program staff.  Follow Up Plan: LCSW to call client or her daughter in the next 4 weeks to talk with client/daughter about financial needs of client and about client procurement of incontinence supplies for client  [name].[place] MSW, LCSW Licensed Clinical Social Worker [facility] [phone]

## 2021-01-13 NOTE — Patient Instructions (Addendum)
Licensed Clinical Social Worker Visit Information  Materials Provided: No  01/13/2021  Name: Mallory Steele            MRN: [id number]       DOB: 05-25-1932  Mallory Steele is a 87 y.o. year old female who is a primary care patient of Dettinger, Fransisca Kaufmann, MD. The CCM team was consulted for assistance with [name] .   Review of patient status, including review of consultants reports, other relevant assessments, and collaboration with appropriate care team members and the patient's provider was performed as part of comprehensive patient evaluation and provision of chronic care management services.    SDOH (Social Determinants of Health) assessments performed: No; risk for depression; risk for tobacco use; risk for stress; risk for physical inactivity  LCSW had scheduled client for phone call today from LCSW. However, upon chart review, no phone numbers were listed for client or for [name], daughter.  LCSW will consult with RNCM Chong Sicilian about this issue to see if Cyril Mourning knows of an accurate phone number for client that can be used by CCM program staff.  Follow Up Plan:LCSW to call client or her daughter in the next 4 weeks to talk with client/daughter about financial needs of client and about client procurement of incontinence supplies for client  LCSW was not able to speak via phone today with client or with daughter of client; thus, the client or her daughter were not able to verbalize understanding of instructions provided today and were not able to accept or decline a print copy of patient instruction materials.   [name].Shanai Lartigue MSW, LCSW Licensed Clinical Social Worker [facility] Care Management [phone]

## 2021-01-15 DIAGNOSIS — R4182 Altered mental status, unspecified: Secondary | ICD-10-CM | POA: Diagnosis not present

## 2021-01-15 DIAGNOSIS — R42 Dizziness and giddiness: Secondary | ICD-10-CM | POA: Diagnosis not present

## 2021-01-15 DIAGNOSIS — Z79899 Other long term (current) drug therapy: Secondary | ICD-10-CM | POA: Diagnosis not present

## 2021-01-15 DIAGNOSIS — M79602 Pain in left arm: Secondary | ICD-10-CM | POA: Diagnosis not present

## 2021-01-15 DIAGNOSIS — R079 Chest pain, unspecified: Secondary | ICD-10-CM | POA: Diagnosis not present

## 2021-01-15 DIAGNOSIS — R0602 Shortness of breath: Secondary | ICD-10-CM | POA: Diagnosis not present

## 2021-01-15 DIAGNOSIS — M79605 Pain in left leg: Secondary | ICD-10-CM | POA: Diagnosis not present

## 2021-01-15 DIAGNOSIS — I4891 Unspecified atrial fibrillation: Secondary | ICD-10-CM | POA: Diagnosis not present

## 2021-01-15 DIAGNOSIS — G309 Alzheimer's disease, unspecified: Secondary | ICD-10-CM | POA: Diagnosis not present

## 2021-01-15 DIAGNOSIS — F0280 Dementia in other diseases classified elsewhere without behavioral disturbance: Secondary | ICD-10-CM | POA: Diagnosis not present

## 2021-01-26 ENCOUNTER — Ambulatory Visit: Payer: Medicare HMO | Admitting: Family Medicine

## 2021-01-28 ENCOUNTER — Other Ambulatory Visit: Payer: Self-pay | Admitting: Family Medicine

## 2021-01-28 ENCOUNTER — Encounter: Payer: Self-pay | Admitting: Family Medicine

## 2021-01-28 DIAGNOSIS — F0391 Unspecified dementia with behavioral disturbance: Secondary | ICD-10-CM

## 2021-01-28 DIAGNOSIS — F03918 Unspecified dementia, unspecified severity, with other behavioral disturbance: Secondary | ICD-10-CM

## 2021-01-28 DIAGNOSIS — R252 Cramp and spasm: Secondary | ICD-10-CM

## 2021-02-02 ENCOUNTER — Other Ambulatory Visit: Payer: Self-pay | Admitting: Family Medicine

## 2021-02-02 DIAGNOSIS — F0391 Unspecified dementia with behavioral disturbance: Secondary | ICD-10-CM

## 2021-02-02 DIAGNOSIS — F419 Anxiety disorder, unspecified: Secondary | ICD-10-CM

## 2021-02-02 DIAGNOSIS — F03918 Unspecified dementia, unspecified severity, with other behavioral disturbance: Secondary | ICD-10-CM

## 2021-02-02 DIAGNOSIS — D509 Iron deficiency anemia, unspecified: Secondary | ICD-10-CM

## 2021-02-02 DIAGNOSIS — K219 Gastro-esophageal reflux disease without esophagitis: Secondary | ICD-10-CM

## 2021-02-02 DIAGNOSIS — F331 Major depressive disorder, recurrent, moderate: Secondary | ICD-10-CM

## 2021-02-12 ENCOUNTER — Telehealth: Payer: Medicare HMO

## 2021-02-15 ENCOUNTER — Other Ambulatory Visit: Payer: Self-pay | Admitting: Family Medicine

## 2021-02-15 DIAGNOSIS — F0391 Unspecified dementia with behavioral disturbance: Secondary | ICD-10-CM

## 2021-02-15 DIAGNOSIS — F03918 Unspecified dementia, unspecified severity, with other behavioral disturbance: Secondary | ICD-10-CM

## 2021-02-15 DIAGNOSIS — F331 Major depressive disorder, recurrent, moderate: Secondary | ICD-10-CM

## 2021-02-15 DIAGNOSIS — F419 Anxiety disorder, unspecified: Secondary | ICD-10-CM

## 2021-02-19 ENCOUNTER — Other Ambulatory Visit: Payer: Self-pay | Admitting: Family Medicine

## 2021-02-19 DIAGNOSIS — R252 Cramp and spasm: Secondary | ICD-10-CM

## 2021-02-24 ENCOUNTER — Other Ambulatory Visit: Payer: Self-pay | Admitting: Family Medicine

## 2021-02-24 DIAGNOSIS — K219 Gastro-esophageal reflux disease without esophagitis: Secondary | ICD-10-CM

## 2021-03-03 ENCOUNTER — Ambulatory Visit: Payer: Medicare HMO | Admitting: Family Medicine

## 2021-03-04 ENCOUNTER — Encounter: Payer: Self-pay | Admitting: Family Medicine

## 2021-03-04 ENCOUNTER — Ambulatory Visit (INDEPENDENT_AMBULATORY_CARE_PROVIDER_SITE_OTHER): Payer: Medicare HMO | Admitting: Family Medicine

## 2021-03-04 ENCOUNTER — Other Ambulatory Visit: Payer: Self-pay

## 2021-03-04 VITALS — BP 127/71 | HR 83 | Ht 65.0 in | Wt 157.0 lb

## 2021-03-04 DIAGNOSIS — F419 Anxiety disorder, unspecified: Secondary | ICD-10-CM

## 2021-03-04 DIAGNOSIS — G301 Alzheimer's disease with late onset: Secondary | ICD-10-CM

## 2021-03-04 DIAGNOSIS — S42294D Other nondisplaced fracture of upper end of right humerus, subsequent encounter for fracture with routine healing: Secondary | ICD-10-CM

## 2021-03-04 DIAGNOSIS — N3942 Incontinence without sensory awareness: Secondary | ICD-10-CM | POA: Diagnosis not present

## 2021-03-04 DIAGNOSIS — F331 Major depressive disorder, recurrent, moderate: Secondary | ICD-10-CM

## 2021-03-04 DIAGNOSIS — F0281 Dementia in other diseases classified elsewhere with behavioral disturbance: Secondary | ICD-10-CM

## 2021-03-04 MED ORDER — RISPERIDONE 2 MG PO TABS: 2.0000 mg | ORAL_TABLET | Freq: Two times a day (BID) | ORAL | 3 refills | Status: DC

## 2021-03-04 NOTE — Progress Notes (Signed)
BP 127/71   Pulse 83   Ht 5\' 5"  (1.651 m)   Wt 157 lb (71.2 kg)   SpO2 97%   BMI 26.13 kg/m    Subjective:   Patient ID: Mallory Steele, female    DOB: 1932-11-20, 88 y.o.   MRN: [id number]  HPI: Mallory Steele is a 87 y.o. female presenting on 03/04/2021 for Medical Management of Chronic Issues, Depression, and Urinary Incontinence   HPI Alzheimer's with behavioral disturbance Family is coming in for Alzheimer's and behavioral disturbances and that she is still not doing very well she is on the Cymbalta and donepezil and Requip to help with the sleeping and takes trazodone at night but still not helping.  We did start Risperdal and they did not notice as much of a difference.  We discussed the risks and benefits of increasing the Risperdal for her age.  Family says that she is is always complained about how she would rather be dead and wants to not be in his life anymore and they are concerned about her and would like to increase the medicine to see if it can help.  Patient has urinary incontinence and bowel incontinence because she does not have the sensation and needs a new prescription for incontinence supplies.  Patient had a pelvic fracture in her history and has difficulty walking because of this and falls frequently and they want a walker to help prevent this.  Relevant past medical, surgical, family and social history reviewed and updated as indicated. Interim medical history since our last visit reviewed. Allergies and medications reviewed and updated.  Review of Systems  Constitutional: Negative for chills and fever.  Eyes: Negative for visual disturbance.  Respiratory: Negative for chest tightness and shortness of breath.   Cardiovascular: Negative for chest pain and leg swelling.  Musculoskeletal: Negative for back pain and gait problem.  Skin: Negative for rash.  Neurological: Negative for light-headedness and headaches.  Psychiatric/Behavioral: Positive for  behavioral problems, confusion, dysphoric mood and sleep disturbance. Negative for agitation, self-injury and suicidal ideas. The patient is nervous/anxious.   All other systems reviewed and are negative.   Per HPI unless specifically indicated above   Allergies as of 03/04/2021   No Known Allergies     Medication List       Accurate as of March 04, 2021  2:49 PM. If you have any questions, ask your nurse or doctor.        clobetasol cream 0.05 % Commonly known as: TEMOVATE Apply 1 application topically 2 (two) times daily.   Depend Underwear Large Misc 1 each by Does not apply route 4 (four) times daily as needed.   donepezil 10 MG tablet Commonly known as: ARICEPT TAKE 1 TABLET BY MOUTH EVERY DAY   DULoxetine 60 MG capsule Commonly known as: CYMBALTA Take 2 capsules (120 mg total) by mouth daily. (NEEDS TO BE SEEN BEFORE NEXT REFILL)   ferrous sulfate 325 (65 FE) MG tablet Take 1 tablet (325 mg total) by mouth daily with breakfast. (NEEDS TO BE SEEN BEFORE NEXT REFILL)   FOLIC ACID PO Take by mouth.   meclizine 25 MG tablet Commonly known as: ANTIVERT TAKE 1 TABLET (25 MG TOTAL) BY MOUTH 3 (THREE) TIMES DAILY AS NEEDED FOR DIZZINESS.   omeprazole 20 MG capsule Commonly known as: PRILOSEC Take 1 capsule (20 mg total) by mouth daily. (NEEDS TO BE SEEN BEFORE NEXT REFILL)   risperiDONE 1 MG tablet Commonly known as: RISPERDAL Take  1 tablet (1 mg total) by mouth 2 (two) times daily.   rOPINIRole 0.25 MG tablet Commonly known as: REQUIP Take 1 tablet (0.25 mg total) by mouth 3 (three) times daily. (NEEDS TO BE SEEN BEFORE NEXT REFILL)   traZODone 50 MG tablet Commonly known as: DESYREL Take 1-2 tablets (50-100 mg total) by mouth at bedtime as needed for sleep.   vitamin B-12 250 MCG tablet Commonly known as: CYANOCOBALAMIN TAKE 1 TABLET BY MOUTH EVERY DAY   zinc gluconate 50 MG tablet Take 1 tablet (50 mg total) by mouth daily.        Objective:    BP 127/71   Pulse 83   Ht 5\' 5"  (1.651 m)   Wt 157 lb (71.2 kg)   SpO2 97%   BMI 26.13 kg/m   Wt Readings from Last 3 Encounters:  03/04/21 157 lb (71.2 kg)  07/11/20 168 lb (76.2 kg)  01/14/20 170 lb 12.8 oz (77.5 kg)    Physical Exam Vitals and nursing note reviewed.  Constitutional:      General: She is not in acute distress.    Appearance: She is well-developed. She is not diaphoretic.  Eyes:     Conjunctiva/sclera: Conjunctivae normal.  Cardiovascular:     Rate and Rhythm: Normal rate and regular rhythm.     Heart sounds: Normal heart sounds. No murmur heard.   Pulmonary:     Effort: Pulmonary effort is normal. No respiratory distress.     Breath sounds: Normal breath sounds. No wheezing.  Musculoskeletal:        General: No tenderness. Normal range of motion.  Skin:    General: Skin is warm and dry.     Findings: No rash.  Neurological:     Mental Status: She is alert and oriented to person, place, and time.     Coordination: Coordination normal.  Psychiatric:        Mood and Affect: Mood is anxious and depressed.        Behavior: Behavior normal.        Thought Content: Thought content does not include suicidal ideation. Thought content does not include suicidal plan.       Assessment & Plan:   Problem List Items Addressed This Visit      Nervous and Auditory   Dementia ([place]) - Primary   Relevant Medications   risperiDONE (RISPERDAL) 2 MG tablet     Musculoskeletal and Integument   Closed fracture of right proximal humerus   Relevant Orders   For home use only DME 4 wheeled rolling walker with seat ([id number])     Other   Depression   Relevant Medications   risperiDONE (RISPERDAL) 2 MG tablet   Anxiety   Relevant Medications   risperiDONE (RISPERDAL) 2 MG tablet    Other Visit Diagnoses    Urinary incontinence without sensory awareness       Relevant Orders   For home use only DME Other see comment      Prescribed prescription for  urine incontinence supplies, will increase risperidone to 2 mg twice daily.  Discussed risks and benefits of this with the elderly. Follow up plan: Return in about 3 months (around 06/03/2021), or if symptoms worsen or fail to improve, for Recheck anxiety and depression..  Counseling provided for all of the vaccine components No orders of the defined types were placed in this encounter.   Caryl Pina, MD [facility] 03/04/2021, 2:49 PM

## 2021-03-20 ENCOUNTER — Telehealth: Payer: Medicare HMO

## 2021-03-26 ENCOUNTER — Other Ambulatory Visit: Payer: Self-pay | Admitting: Family Medicine

## 2021-03-26 DIAGNOSIS — G309 Alzheimer's disease, unspecified: Secondary | ICD-10-CM

## 2021-03-26 DIAGNOSIS — F03918 Unspecified dementia, unspecified severity, with other behavioral disturbance: Secondary | ICD-10-CM

## 2021-03-26 DIAGNOSIS — F331 Major depressive disorder, recurrent, moderate: Secondary | ICD-10-CM

## 2021-03-26 DIAGNOSIS — F0281 Dementia in other diseases classified elsewhere with behavioral disturbance: Secondary | ICD-10-CM

## 2021-03-26 DIAGNOSIS — F0391 Unspecified dementia with behavioral disturbance: Secondary | ICD-10-CM

## 2021-03-26 DIAGNOSIS — F419 Anxiety disorder, unspecified: Secondary | ICD-10-CM

## 2021-03-26 DIAGNOSIS — D509 Iron deficiency anemia, unspecified: Secondary | ICD-10-CM

## 2021-04-06 ENCOUNTER — Other Ambulatory Visit: Payer: Self-pay | Admitting: Family Medicine

## 2021-04-06 DIAGNOSIS — F03918 Unspecified dementia, unspecified severity, with other behavioral disturbance: Secondary | ICD-10-CM

## 2021-04-06 DIAGNOSIS — F419 Anxiety disorder, unspecified: Secondary | ICD-10-CM

## 2021-04-06 DIAGNOSIS — F0391 Unspecified dementia with behavioral disturbance: Secondary | ICD-10-CM

## 2021-04-06 DIAGNOSIS — D509 Iron deficiency anemia, unspecified: Secondary | ICD-10-CM

## 2021-04-06 DIAGNOSIS — R252 Cramp and spasm: Secondary | ICD-10-CM

## 2021-04-28 ENCOUNTER — Telehealth: Payer: Medicare HMO

## 2021-05-07 ENCOUNTER — Ambulatory Visit (INDEPENDENT_AMBULATORY_CARE_PROVIDER_SITE_OTHER): Payer: Medicare HMO | Admitting: Family Medicine

## 2021-05-07 ENCOUNTER — Encounter: Payer: Self-pay | Admitting: Family Medicine

## 2021-05-07 ENCOUNTER — Other Ambulatory Visit: Payer: Self-pay

## 2021-05-07 VITALS — BP 144/67 | HR 93 | Ht 65.0 in | Wt 151.0 lb

## 2021-05-07 DIAGNOSIS — F0281 Dementia in other diseases classified elsewhere with behavioral disturbance: Secondary | ICD-10-CM

## 2021-05-07 DIAGNOSIS — R627 Adult failure to thrive: Secondary | ICD-10-CM | POA: Diagnosis not present

## 2021-05-07 DIAGNOSIS — G301 Alzheimer's disease with late onset: Secondary | ICD-10-CM | POA: Diagnosis not present

## 2021-05-07 DIAGNOSIS — R110 Nausea: Secondary | ICD-10-CM

## 2021-05-07 DIAGNOSIS — F02818 Dementia in other diseases classified elsewhere, unspecified severity, with other behavioral disturbance: Secondary | ICD-10-CM

## 2021-05-07 DIAGNOSIS — R32 Unspecified urinary incontinence: Secondary | ICD-10-CM | POA: Diagnosis not present

## 2021-05-07 DIAGNOSIS — R8290 Unspecified abnormal findings in urine: Secondary | ICD-10-CM | POA: Diagnosis not present

## 2021-05-07 DIAGNOSIS — G4700 Insomnia, unspecified: Secondary | ICD-10-CM

## 2021-05-07 LAB — URINALYSIS
Bilirubin, UA: NEGATIVE
Glucose, UA: NEGATIVE
Ketones, UA: NEGATIVE
Leukocytes,UA: NEGATIVE
Nitrite, UA: NEGATIVE
Protein,UA: NEGATIVE
Specific Gravity, UA: 1.015 (ref 1.005–1.030)
Urobilinogen, Ur: 0.2 mg/dL (ref 0.2–1.0)
pH, UA: 5.5 (ref 5.0–7.5)

## 2021-05-07 MED ORDER — LORAZEPAM 0.5 MG PO TABS: 0.5000 mg | ORAL_TABLET | Freq: Two times a day (BID) | ORAL | 1 refills | Status: DC | PRN

## 2021-05-07 NOTE — Progress Notes (Signed)
BP (!) 144/67   Pulse 93   Ht 5\' 5"  (1.651 m)   Wt 151 lb (68.5 kg)   SpO2 99%   BMI 25.13 kg/m    Subjective:   Patient ID: Mallory Steele, female    DOB: June 28, 1932, 87 y.o.   MRN: [id number]  HPI: Mallory Steele is a 87 y.o. female presenting on 05/07/2021 for Medical Management of Chronic Issues, Anxiety, and body pain   HPI Patient is coming in for worsening anxiety and depression and dementia.  She says she does not live and her daughter who is her caretaker and lives with her says that she is having increasing difficulty transferring her because she is no longer helping move or transfer herself.  She has also lost all control of her bowel or bladder and has gotten to where in the past week she is not wanting to eat or drink hardly anything.  She is still making urine but it has significantly decreased.  Her dementia has drastically worsened and her daughter is getting to where she is having a lot of difficulty controlling or helping her.  She says she is just ready to die  Relevant past medical, surgical, family and social history reviewed and updated as indicated. Interim medical history since our last visit reviewed. Allergies and medications reviewed and updated.  Review of Systems  Constitutional:  Positive for appetite change. Negative for chills and fever.  Eyes:  Negative for visual disturbance.  Respiratory:  Negative for chest tightness and shortness of breath.   Cardiovascular:  Negative for chest pain and leg swelling.  Musculoskeletal:  Negative for back pain and gait problem.  Skin:  Negative for rash.  Neurological:  Negative for light-headedness and headaches.  Psychiatric/Behavioral:  Positive for confusion, decreased concentration, dysphoric mood and sleep disturbance. Negative for agitation, behavioral problems and self-injury. The patient is nervous/anxious.   All other systems reviewed and are negative.  Per HPI unless specifically indicated  above   Allergies as of 05/07/2021   No Known Allergies      Medication List        Accurate as of May 07, 2021  5:06 PM. If you have any questions, ask your nurse or doctor.          clobetasol cream 0.05 % Commonly known as: TEMOVATE Apply 1 application topically 2 (two) times daily.   Depend Underwear Large Misc 1 each by Does not apply route 4 (four) times daily as needed.   donepezil 10 MG tablet Commonly known as: ARICEPT TAKE 1 TABLET BY MOUTH EVERY DAY   DULoxetine 60 MG capsule Commonly known as: CYMBALTA Take 2 capsules (120 mg total) by mouth daily. (NEEDS TO BE SEEN BEFORE NEXT REFILL)   ferrous sulfate 325 (65 FE) MG tablet TAKE 1 TABLET BY MOUTH EVERY DAY WITH BREAKFAST   FOLIC ACID PO Take by mouth.   LORazepam 0.5 MG tablet Commonly known as: ATIVAN Take 1 tablet (0.5 mg total) by mouth 2 (two) times daily as needed for anxiety. Started by: Worthy Rancher, MD   meclizine 25 MG tablet Commonly known as: ANTIVERT TAKE 1 TABLET (25 MG TOTAL) BY MOUTH 3 (THREE) TIMES DAILY AS NEEDED FOR DIZZINESS.   omeprazole 20 MG capsule Commonly known as: PRILOSEC Take 1 capsule (20 mg total) by mouth daily. (NEEDS TO BE SEEN BEFORE NEXT REFILL)   risperiDONE 2 MG tablet Commonly known as: RISPERDAL Take 1 tablet (2 mg total) by mouth  2 (two) times daily.   rOPINIRole 0.25 MG tablet Commonly known as: REQUIP Take 1 tablet (0.25 mg total) by mouth 3 (three) times daily.   traZODone 50 MG tablet Commonly known as: DESYREL TAKE 1-2 TABLETS BY MOUTH AT BEDTIME AS NEEDED FOR SLEEP.   vitamin B-12 250 MCG tablet Commonly known as: CYANOCOBALAMIN TAKE 1 TABLET BY MOUTH EVERY DAY   zinc gluconate 50 MG tablet Take 1 tablet (50 mg total) by mouth daily.         Objective:   BP (!) 144/67   Pulse 93   Ht 5\' 5"  (1.651 m)   Wt 151 lb (68.5 kg)   SpO2 99%   BMI 25.13 kg/m   Wt Readings from Last 3 Encounters:  05/07/21 151 lb (68.5 kg)   03/04/21 157 lb (71.2 kg)  07/11/20 168 lb (76.2 kg)    Physical Exam Vitals and nursing note reviewed.  Constitutional:      General: She is not in acute distress.    Appearance: She is well-developed. She is not diaphoretic.  Cardiovascular:     Rate and Rhythm: Normal rate and regular rhythm.     Heart sounds: Normal heart sounds. No murmur heard. Pulmonary:     Effort: Pulmonary effort is normal. No respiratory distress.     Breath sounds: Normal breath sounds. No wheezing.  Musculoskeletal:        General: No tenderness. Normal range of motion.  Skin:    General: Skin is warm and dry.     Findings: No rash.  Neurological:     Mental Status: She is alert and oriented to person, place, and time.     Coordination: Coordination normal.  Psychiatric:        Attention and Perception: She is inattentive.        Mood and Affect: Mood is anxious and depressed.        Behavior: Behavior normal.        Cognition and Memory: Cognition is impaired. Memory is impaired.    Urinalysis: Trace RBCs but otherwise negative.  Assessment & Plan:   Problem List Items Addressed This Visit       Nervous and Auditory   Dementia ([place]) - Primary   Relevant Medications   LORazepam (ATIVAN) 0.5 MG tablet     Other   Insomnia   Relevant Orders   Urine Culture   Urinalysis   Other Visit Diagnoses     Nausea       Relevant Orders   Urine Culture   Urinalysis   Adult failure to thrive       Urinary incontinence, unspecified type           Patient is no longer eating much and not drinking much fluids and has no control of her bowel or bladder and family can no longer help transfer her very easily out of bed because her daughter who lives with her has back issues.  Her memory has continuously worsened and it is rapidly worsening. Follow up plan: Return if symptoms worsen or fail to improve.  Counseling provided for all of the vaccine components Orders Placed This Encounter   Procedures   Urine Culture   Urinalysis    Caryl Pina, MD [facility] 05/07/2021, 5:06 PM

## 2021-05-12 LAB — URINE CULTURE

## 2021-05-13 ENCOUNTER — Other Ambulatory Visit: Payer: Self-pay | Admitting: Family Medicine

## 2021-05-13 MED ORDER — CEPHALEXIN 500 MG PO CAPS: 500.0000 mg | ORAL_CAPSULE | Freq: Four times a day (QID) | ORAL | 0 refills | Status: DC

## 2021-05-13 NOTE — Progress Notes (Unsigned)
Urine culture grew E. coli, sent Keflex for her.

## 2021-05-14 NOTE — Progress Notes (Signed)
Patient aware and verbalizes understanding. 

## 2021-06-18 ENCOUNTER — Telehealth: Payer: Medicare HMO

## 2021-06-20 ENCOUNTER — Other Ambulatory Visit: Payer: Self-pay | Admitting: Family Medicine

## 2021-06-20 DIAGNOSIS — F03918 Unspecified dementia, unspecified severity, with other behavioral disturbance: Secondary | ICD-10-CM

## 2021-06-20 DIAGNOSIS — F0391 Unspecified dementia with behavioral disturbance: Secondary | ICD-10-CM

## 2021-07-14 ENCOUNTER — Other Ambulatory Visit: Payer: Self-pay

## 2021-07-14 ENCOUNTER — Ambulatory Visit (INDEPENDENT_AMBULATORY_CARE_PROVIDER_SITE_OTHER): Admitting: Nurse Practitioner

## 2021-07-14 ENCOUNTER — Ambulatory Visit (INDEPENDENT_AMBULATORY_CARE_PROVIDER_SITE_OTHER): Payer: Medicare Other

## 2021-07-14 ENCOUNTER — Other Ambulatory Visit: Payer: Self-pay | Admitting: Nurse Practitioner

## 2021-07-14 ENCOUNTER — Telehealth: Payer: Self-pay | Admitting: *Deleted

## 2021-07-14 ENCOUNTER — Encounter: Payer: Self-pay | Admitting: Nurse Practitioner

## 2021-07-14 VITALS — BP 121/78 | HR 76 | Temp 97.7°F | Ht 65.0 in | Wt 148.0 lb

## 2021-07-14 DIAGNOSIS — M79642 Pain in left hand: Secondary | ICD-10-CM

## 2021-07-14 DIAGNOSIS — W19XXXA Unspecified fall, initial encounter: Secondary | ICD-10-CM | POA: Diagnosis not present

## 2021-07-14 MED ORDER — TRAMADOL HCL 50 MG PO TABS: 50.0000 mg | ORAL_TABLET | Freq: Three times a day (TID) | ORAL | 0 refills | Status: AC | PRN

## 2021-07-14 NOTE — Telephone Encounter (Signed)
Patients daughter aware

## 2021-07-14 NOTE — Progress Notes (Signed)
Acute Office Visit  Subjective:    Patient ID: Mallory Steele, female    DOB: 04-Sep-1932, 87 y.o.   MRN: [id number]  Chief Complaint  Patient presents with   Fall   Hand Pain    Fall The accident occurred 1 to 3 hours ago. The fall occurred while walking. She fell from an unknown height. She landed on [place]. There was no blood loss. The point of impact was the right knee (left hand). The pain is present in the left hand, nose and face. The pain is moderate. The symptoms are aggravated by ambulation. Pertinent negatives include no abdominal pain, bowel incontinence, fever, hearing loss or hematuria. She has tried ice for the symptoms. The treatment provided mild relief.  Hand Pain  The incident occurred 1 to 3 hours ago. The incident occurred at home. The injury mechanism was a fall. The pain is present in the left hand. The quality of the pain is described as aching. The pain is at a severity of 7/10. The pain is severe. The pain has been Constant since the incident. The symptoms are aggravated by movement. She has tried ice for the symptoms. The treatment provided mild relief.    Past Medical History:  Diagnosis Date   Anxiety    Dementia [facility])     Past Surgical History:  Procedure Laterality Date   APPENDECTOMY  1947   COLON SURGERY  [id number]   JOINT REPLACEMENT     knee replaced    Family History  Problem Relation Age of Onset   Arthritis Mother    Cancer Mother    Arthritis Father    Asthma Father    COPD Father    Arthritis Sister    Birth defects Sister    Diabetes Sister    Hearing loss Sister    Mental retardation Sister    Arthritis Brother    Early death Brother    Heart disease Brother    Hypertension Brother     Social History   Socioeconomic History   Marital status: Widowed    Spouse name: Not on file   Number of children: Not on file   Years of education: Not on file   Highest education level: Not on file  Occupational History   Not  on file  Tobacco Use   Smoking status: Former    Packs/day: 0.25    Years: 3.00    Pack years: 0.75    Types: Cigarettes    Quit date: 09/22/1985    Years since quitting: 35.8   Smokeless tobacco: Never  Vaping Use   Vaping Use: Never used  Substance and Sexual Activity   Alcohol use: Yes    Alcohol/week: 1.0 standard drink    Types: 1 Glasses of wine per week   Drug use: No   Sexual activity: Never  Other Topics Concern   Not on file  Social History Narrative   Not on file   Social Determinants of Health   Financial Resource Strain: Not on file  Food Insecurity: Not on file  Transportation Needs: Not on file  Physical Activity: Not on file  Stress: Not on file  Social Connections: Not on file  Intimate Partner Violence: Not on file    Outpatient Medications Prior to Visit  Medication Sig Dispense Refill   clobetasol cream (TEMOVATE) [id number] % Apply 1 application topically 2 (two) times daily. 90 g 2   donepezil (ARICEPT) 10 MG tablet TAKE 1 TABLET BY MOUTH  EVERY DAY 90 tablet 1   DULoxetine (CYMBALTA) 60 MG capsule Take 2 capsules (120 mg total) by mouth daily. (NEEDS TO BE SEEN BEFORE NEXT REFILL) 60 capsule 0   ferrous sulfate 325 (65 FE) MG tablet TAKE 1 TABLET BY MOUTH EVERY DAY WITH BREAKFAST 90 tablet 1   FOLIC ACID PO Take by mouth.     Incontinence Supply Disposable (DEPEND UNDERWEAR LARGE) MISC 1 each by Does not apply route 4 (four) times daily as needed. 60 each 11   LORazepam (ATIVAN) 0.5 MG tablet Take 1 tablet (0.5 mg total) by mouth 2 (two) times daily as needed for anxiety. 60 tablet 1   meclizine (ANTIVERT) 25 MG tablet TAKE 1 TABLET (25 MG TOTAL) BY MOUTH 3 (THREE) TIMES DAILY AS NEEDED FOR DIZZINESS. 30 tablet 2   omeprazole (PRILOSEC) 20 MG capsule Take 1 capsule (20 mg total) by mouth daily. (NEEDS TO BE SEEN BEFORE NEXT REFILL) 30 capsule 0   risperiDONE (RISPERDAL) 2 MG tablet Take 1 tablet (2 mg total) by mouth 2 (two) times daily. 180 tablet 3    rOPINIRole (REQUIP) 0.25 MG tablet Take 1 tablet (0.25 mg total) by mouth 3 (three) times daily. 90 tablet 2   traZODone (DESYREL) 50 MG tablet TAKE 1 TO 2 TABLETS BY MOUTH AT BEDTIME AS NEEDED FOR SLEEP 180 tablet 0   vitamin B-12 (CYANOCOBALAMIN) 250 MCG tablet TAKE 1 TABLET BY MOUTH EVERY DAY 90 tablet 1   zinc gluconate 50 MG tablet Take 1 tablet (50 mg total) by mouth daily. 90 tablet 3   cephALEXin (KEFLEX) 500 MG capsule Take 1 capsule (500 mg total) by mouth 4 (four) times daily. 28 capsule 0   No facility-administered medications prior to visit.    No Known Allergies  Review of Systems  Constitutional:  Negative for fever.  Respiratory: Negative.    Gastrointestinal:  Negative for abdominal pain and bowel incontinence.  Genitourinary:  Negative for hematuria.  Musculoskeletal:  Positive for joint swelling.  Skin:  Negative for rash.  All other systems reviewed and are negative.     Objective:    Physical Exam Vitals and nursing note reviewed. Exam conducted with a chaperone present (Daughter).  HENT:     Head: Normocephalic.     Nose: Nose normal.  Eyes:     Conjunctiva/sclera: Conjunctivae normal.  Cardiovascular:     Rate and Rhythm: Normal rate and regular rhythm.     Pulses: Normal pulses.     Heart sounds: Normal heart sounds.  Abdominal:     General: Bowel sounds are normal.  Musculoskeletal:     Left upper arm: Swelling, edema and tenderness present.     Right knee: Swelling present. Normal range of motion. Tenderness present.  Skin:    General: Skin is dry.     Findings: Bruising and erythema present. No rash.  Neurological:     Mental Status: She is oriented to person, place, and time.    BP 121/78   Pulse 76   Temp 97.7 F (36.5 C) (Temporal)   Ht '5\' 5"'$  (1.651 m)   Wt 148 lb (67.1 kg)   SpO2 96%   BMI 24.63 kg/m  Wt Readings from Last 3 Encounters:  07/14/21 148 lb (67.1 kg)  05/07/21 151 lb (68.5 kg)  03/04/21 157 lb (71.2 kg)    Health  Maintenance Due  Topic Date Due   COVID-19 Vaccine (1) Never done   Zoster Vaccines- Shingrix (1 of 2)  Never done   INFLUENZA VACCINE  06/22/2021    There are no preventive care reminders to display for this patient.   Lab Results  Component Value Date   TSH 2.100 04/29/2016   Lab Results  Component Value Date   WBC 5.6 07/11/2020   HGB 12.7 07/11/2020   HCT 38.4 07/11/2020   MCV 94 07/11/2020   PLT 148 (L) 07/11/2020   Lab Results  Component Value Date   NA 141 07/11/2020   K 4.5 07/11/2020   CO2 25 07/11/2020   GLUCOSE 89 07/11/2020   BUN 15 07/11/2020   CREATININE 1.21 (H) 07/11/2020   BILITOT 0.2 07/11/2020   ALKPHOS 112 07/11/2020   AST 20 07/11/2020   ALT 19 07/11/2020   PROT 6.5 07/11/2020   ALBUMIN 4.2 07/11/2020   CALCIUM 8.9 07/11/2020   Lab Results  Component Value Date   CHOL 106 04/29/2016   Lab Results  Component Value Date   HDL 60 04/29/2016   Lab Results  Component Value Date   LDLCALC 23 04/29/2016   Lab Results  Component Value Date   TRIG 115 04/29/2016   Lab Results  Component Value Date   CHOLHDL 1.8 04/29/2016   Lab Results  Component Value Date   HGBA1C 5.5 04/01/2017       Assessment & Plan:   Problem List Items Addressed This Visit       Other   Fall - Primary    Patient had a fall couple of hours ago.  Patient to use her left hand to break the fall.  Symptoms include swollen left hand, bruising on nose and forehead, bruising on right knee.  Patient rates pain moderate, initial ice application with mild therapeutic effect.  Completed left hand x-ray results pending.  Advised patient to apply ice on left hand.  All skin bruises cleaned with saline, triple antibiotic cream applied over by clinical nurse.  Advised patient to use Tylenol/ibuprofen as needed for pain.  Follow-up with worsening unresolved symptoms.      Relevant Orders   DG Wrist Complete Left   Other Visit Diagnoses     Pain of left hand        Relevant Orders   DG Wrist Complete Left        No orders of the defined types were placed in this encounter.    Ivy Lynn, NP

## 2021-07-14 NOTE — Assessment & Plan Note (Signed)
Patient had a fall couple of hours ago.  Patient to use her left hand to break the fall.  Symptoms include swollen left hand, bruising on nose and forehead, bruising on right knee.  Patient rates pain moderate, initial ice application with mild therapeutic effect.  Completed left hand x-ray results pending.  Advised patient to apply ice on left hand.  All skin bruises cleaned with saline, triple antibiotic cream applied over by clinical nurse.  Advised patient to use Tylenol/ibuprofen as needed for pain.  Follow-up with worsening unresolved symptoms.

## 2021-07-14 NOTE — Telephone Encounter (Signed)
Patients daughter called and said her mom was in more pain in her hand and running up arm. Tylenol is not touching pain. Pls adviseon

## 2021-07-15 ENCOUNTER — Telehealth: Payer: Self-pay | Admitting: Family Medicine

## 2021-07-15 NOTE — Telephone Encounter (Signed)
PA denied.

## 2021-07-15 NOTE — Telephone Encounter (Signed)
Your information has been sent to [facility].

## 2021-07-28 ENCOUNTER — Ambulatory Visit: Admitting: Licensed Clinical Social Worker

## 2021-07-28 DIAGNOSIS — F02818 Dementia in other diseases classified elsewhere, unspecified severity, with other behavioral disturbance: Secondary | ICD-10-CM

## 2021-07-28 DIAGNOSIS — K219 Gastro-esophageal reflux disease without esophagitis: Secondary | ICD-10-CM

## 2021-07-28 DIAGNOSIS — E871 Hypo-osmolality and hyponatremia: Secondary | ICD-10-CM

## 2021-07-28 DIAGNOSIS — F419 Anxiety disorder, unspecified: Secondary | ICD-10-CM

## 2021-07-28 DIAGNOSIS — F0281 Dementia in other diseases classified elsewhere with behavioral disturbance: Secondary | ICD-10-CM

## 2021-07-28 DIAGNOSIS — D509 Iron deficiency anemia, unspecified: Secondary | ICD-10-CM

## 2021-07-28 NOTE — Patient Instructions (Signed)
Visit Information  PATIENT GOALS:  Goals Addressed               This Visit's Progress     Complete ADLs daily as she is able (pt-stated)        Short Term Goal Priority:  Medium Progress: On Track Start Date:  07/28/21 End Date:  07/30/21  Follow Up:  Client is discharged today from CCM services since she is now under [place] in the home  Complete ADLs daily as she is able  Coping Skills: Hospice Care Family Support  Client Deficits: Memory issues Pain issues Mobility issues  Goals:  Client will cooperate in next 30 days with Hospice Care providers assisting her in the home environment  Follow Up:  Client is discharged from CCM services today since she receives [place] in the home    [name].Maleaha Hughett MSW, LCSW Licensed Clinical Social Worker [facility] Care Management [phone]

## 2021-07-28 NOTE — Chronic Care Management (AMB) (Signed)
Chronic Care Management    Clinical Social Work Note  07/28/2021 Name: CHASITEE MATTHIESEN MRN: [id number] DOB: 04-20-1932  Nunzio Cory Edholm is a 87 y.o. year old female who is a primary care patient of Dettinger, Fransisca Kaufmann, MD. The CCM team was consulted to assist the patient with chronic disease management and/or care coordination needs related to: [name] .   Engaged with patient /daughter of patient, Loriene Wuerth, by telephone for follow up visit in response to provider referral for social work chronic care management and care coordination services.   Consent to Services:  The patient was given information about Chronic Care Management services, agreed to services, and gave verbal consent prior to initiation of services.  Please see initial visit note for detailed documentation.   Patient agreed to services and consent obtained.   Assessment: Review of patient past medical history, allergies, medications, and health status, including review of relevant consultants reports was performed today as part of a comprehensive evaluation and provision of chronic care management and care coordination services.     SDOH (Social Determinants of Health) assessments and interventions performed:  SDOH Interventions    Flowsheet Row Most Recent Value  SDOH Interventions   Physical Activity Interventions Other (Comments)  [walking difficulty,  has a cane and a walker to use as needed]  Depression Interventions/Treatment  Currently on Treatment        Advanced Directives Status: See Vyn[employer] application for related entries.  CCM Care Plan  No Known Allergies  Outpatient Encounter Medications as of 07/28/2021  Medication Sig   clobetasol cream (TEMOVATE) [id number] % Apply 1 application topically 2 (two) times daily.   donepezil (ARICEPT) 10 MG tablet TAKE 1 TABLET BY MOUTH EVERY DAY   DULoxetine (CYMBALTA) 60 MG capsule Take 2 capsules (120 mg total) by mouth daily. (NEEDS TO BE SEEN BEFORE NEXT  REFILL)   ferrous sulfate 325 (65 FE) MG tablet TAKE 1 TABLET BY MOUTH EVERY DAY WITH BREAKFAST   FOLIC ACID PO Take by mouth.   Incontinence Supply Disposable (DEPEND UNDERWEAR LARGE) MISC 1 each by Does not apply route 4 (four) times daily as needed.   LORazepam (ATIVAN) 0.5 MG tablet Take 1 tablet (0.5 mg total) by mouth 2 (two) times daily as needed for anxiety.   meclizine (ANTIVERT) 25 MG tablet TAKE 1 TABLET (25 MG TOTAL) BY MOUTH 3 (THREE) TIMES DAILY AS NEEDED FOR DIZZINESS.   omeprazole (PRILOSEC) 20 MG capsule Take 1 capsule (20 mg total) by mouth daily. (NEEDS TO BE SEEN BEFORE NEXT REFILL)   risperiDONE (RISPERDAL) 2 MG tablet Take 1 tablet (2 mg total) by mouth 2 (two) times daily.   rOPINIRole (REQUIP) 0.25 MG tablet Take 1 tablet (0.25 mg total) by mouth 3 (three) times daily.   traZODone (DESYREL) 50 MG tablet TAKE 1 TO 2 TABLETS BY MOUTH AT BEDTIME AS NEEDED FOR SLEEP   vitamin B-12 (CYANOCOBALAMIN) 250 MCG tablet TAKE 1 TABLET BY MOUTH EVERY DAY   zinc gluconate 50 MG tablet Take 1 tablet (50 mg total) by mouth daily.   No facility-administered encounter medications on file as of 07/28/2021.    Patient Active Problem List   Diagnosis Date Noted   Fall 07/14/2021   GERD without esophagitis 09/10/2019   Psoriasis 06/01/2018   Cholecystitis, chronic 11/23/2017   Closed nondisplaced fracture of head of left radius 11/02/2016   Closed fracture of right proximal humerus 10/12/2016   Dementia ([place]) 10/15/2015   Insomnia 10/15/2015  Depression 10/15/2015   Anxiety 01/01/2014   Iron deficiency anemia 01/01/2014   Long term use of drug 01/01/2014    Conditions to be addressed/monitored: monitor client completion of ADLs , as she is able  Care Plan : LCSW care plan  Updates made by Katha Cabal, LCSW since 07/28/2021 12:00 AM     Problem: Coping Skills (General Plan of Care)      Goal: Coping Skills Enhanced; complete daily ADLs as she is able   Start Date:  07/28/2021  Expected End Date: 07/30/2021  This Visit's Progress: On track  Priority: Medium  Note:   Current barriers:   Patient needs daily help with ADLs completion Patient has pain issues and patient has mobility issues  Clinical Goals:  Patient is now under [place] in the home. Patient will cooperate with Hospice caregivers in the home for the next 30 days  Clinical Interventions:  Collaboration with Dettinger, Fransisca Kaufmann, MD regarding development and update of comprehensive plan of care as evidenced by provider attestation and co-signature Talked with [name], daughter of client, about client needs Talked with [name] about client care with Hospice in the home environment Talked with [name] about pain management for client Talked with [name] about client challenges with hand dexterity Talked with [name] about home health support with Hospice. She said Hospice nurse visits client one time weekly in the home. She said CNA visits client 2 times weekly in the home.  LCSW thanked [name] for phone call with LCSW today [name] was appreciative of CCM program support for client LCSW is discharging client today from CCM services since client is under Hospice Care in the home environment  Patient Strengths Hospice support Family support  Patient Deficits: Mobility issues Memory issues Pain issues  Patient Goals: In next 30 days, patient will cooperate with Hospice in home caregivers helping her in the home environment -  Follow Up Plan: Client is discharged from CCM services today since she is now under [place] in the [place].Josia Cueva MSW, LCSW Licensed Clinical Social Worker [facility] Care Management [phone]

## 2021-08-26 DIAGNOSIS — R296 Repeated falls: Secondary | ICD-10-CM | POA: Diagnosis not present

## 2021-08-26 DIAGNOSIS — M5134 Other intervertebral disc degeneration, thoracic region: Secondary | ICD-10-CM | POA: Diagnosis not present

## 2021-08-26 DIAGNOSIS — J984 Other disorders of lung: Secondary | ICD-10-CM | POA: Diagnosis not present

## 2021-08-26 DIAGNOSIS — G309 Alzheimer's disease, unspecified: Secondary | ICD-10-CM | POA: Diagnosis not present

## 2021-08-26 DIAGNOSIS — S2231XA Fracture of one rib, right side, initial encounter for closed fracture: Secondary | ICD-10-CM | POA: Diagnosis not present

## 2021-08-26 DIAGNOSIS — F0280 Dementia in other diseases classified elsewhere without behavioral disturbance: Secondary | ICD-10-CM | POA: Diagnosis not present

## 2021-08-26 DIAGNOSIS — I2510 Atherosclerotic heart disease of native coronary artery without angina pectoris: Secondary | ICD-10-CM | POA: Diagnosis not present

## 2021-08-26 DIAGNOSIS — R0781 Pleurodynia: Secondary | ICD-10-CM | POA: Diagnosis not present

## 2021-08-26 DIAGNOSIS — W19XXXA Unspecified fall, initial encounter: Secondary | ICD-10-CM | POA: Diagnosis not present

## 2021-08-26 DIAGNOSIS — M4324 Fusion of spine, thoracic region: Secondary | ICD-10-CM | POA: Diagnosis not present

## 2021-08-26 DIAGNOSIS — R52 Pain, unspecified: Secondary | ICD-10-CM | POA: Diagnosis not present

## 2021-08-26 DIAGNOSIS — I700 Atherosclerosis of aorta: Secondary | ICD-10-CM | POA: Diagnosis not present

## 2021-09-09 DIAGNOSIS — I771 Stricture of artery: Secondary | ICD-10-CM | POA: Diagnosis not present

## 2021-09-09 DIAGNOSIS — I700 Atherosclerosis of aorta: Secondary | ICD-10-CM | POA: Diagnosis not present

## 2021-09-09 DIAGNOSIS — N3000 Acute cystitis without hematuria: Secondary | ICD-10-CM | POA: Diagnosis not present

## 2021-09-09 DIAGNOSIS — J984 Other disorders of lung: Secondary | ICD-10-CM | POA: Diagnosis not present

## 2021-09-09 DIAGNOSIS — I6789 Other cerebrovascular disease: Secondary | ICD-10-CM | POA: Diagnosis not present

## 2021-09-09 DIAGNOSIS — R0600 Dyspnea, unspecified: Secondary | ICD-10-CM | POA: Diagnosis not present

## 2021-09-09 DIAGNOSIS — I2510 Atherosclerotic heart disease of native coronary artery without angina pectoris: Secondary | ICD-10-CM | POA: Diagnosis not present

## 2021-09-09 DIAGNOSIS — F0280 Dementia in other diseases classified elsewhere without behavioral disturbance: Secondary | ICD-10-CM | POA: Diagnosis not present

## 2021-09-09 DIAGNOSIS — G309 Alzheimer's disease, unspecified: Secondary | ICD-10-CM | POA: Diagnosis not present

## 2021-09-09 DIAGNOSIS — R1310 Dysphagia, unspecified: Secondary | ICD-10-CM | POA: Diagnosis not present

## 2021-09-09 DIAGNOSIS — W19XXXA Unspecified fall, initial encounter: Secondary | ICD-10-CM | POA: Diagnosis not present

## 2021-09-09 DIAGNOSIS — I878 Other specified disorders of veins: Secondary | ICD-10-CM | POA: Diagnosis not present

## 2021-09-09 DIAGNOSIS — M5136 Other intervertebral disc degeneration, lumbar region: Secondary | ICD-10-CM | POA: Diagnosis not present

## 2021-09-09 DIAGNOSIS — Z20822 Contact with and (suspected) exposure to covid-19: Secondary | ICD-10-CM | POA: Diagnosis not present

## 2021-09-09 DIAGNOSIS — S3991XA Unspecified injury of abdomen, initial encounter: Secondary | ICD-10-CM | POA: Diagnosis not present

## 2021-09-09 DIAGNOSIS — S2241XA Multiple fractures of ribs, right side, initial encounter for closed fracture: Secondary | ICD-10-CM | POA: Diagnosis not present

## 2021-09-09 DIAGNOSIS — S2242XA Multiple fractures of ribs, left side, initial encounter for closed fracture: Secondary | ICD-10-CM | POA: Diagnosis not present

## 2021-09-09 DIAGNOSIS — R0602 Shortness of breath: Secondary | ICD-10-CM | POA: Diagnosis not present

## 2021-09-09 DIAGNOSIS — R519 Headache, unspecified: Secondary | ICD-10-CM | POA: Diagnosis not present

## 2021-09-24 ENCOUNTER — Telehealth: Payer: Self-pay | Admitting: Family Medicine

## 2021-09-24 NOTE — Telephone Encounter (Signed)
  No answer unable to leave a message for patient to call back and schedule Medicare Annual Wellness Visit (AWV) to be completed by video or phone.  No hx of AWV eligible for AWVI as of  11/22/2009 per [employer]  Please schedule at anytime with [facility] Health Advisor.      61 Minutes appointment   Any questions, please call me at [phone]

## 2021-11-12 ENCOUNTER — Telehealth: Payer: Self-pay | Admitting: Family Medicine

## 2021-11-12 NOTE — Telephone Encounter (Signed)
Called pt to set up AWV

## 2021-12-27 DIAGNOSIS — Z79899 Other long term (current) drug therapy: Secondary | ICD-10-CM | POA: Diagnosis not present

## 2021-12-27 DIAGNOSIS — R1310 Dysphagia, unspecified: Secondary | ICD-10-CM | POA: Diagnosis not present

## 2021-12-27 DIAGNOSIS — Z20822 Contact with and (suspected) exposure to covid-19: Secondary | ICD-10-CM | POA: Diagnosis not present

## 2021-12-27 DIAGNOSIS — N1832 Chronic kidney disease, stage 3b: Secondary | ICD-10-CM | POA: Diagnosis not present

## 2021-12-27 DIAGNOSIS — T17920A Food in respiratory tract, part unspecified causing asphyxiation, initial encounter: Secondary | ICD-10-CM | POA: Diagnosis not present

## 2021-12-27 DIAGNOSIS — R0602 Shortness of breath: Secondary | ICD-10-CM | POA: Diagnosis not present

## 2021-12-27 DIAGNOSIS — E041 Nontoxic single thyroid nodule: Secondary | ICD-10-CM | POA: Diagnosis not present

## 2021-12-27 DIAGNOSIS — R059 Cough, unspecified: Secondary | ICD-10-CM | POA: Diagnosis not present

## 2021-12-27 DIAGNOSIS — F458 Other somatoform disorders: Secondary | ICD-10-CM | POA: Diagnosis not present

## 2021-12-27 DIAGNOSIS — G309 Alzheimer's disease, unspecified: Secondary | ICD-10-CM | POA: Diagnosis not present

## 2021-12-27 DIAGNOSIS — R0989 Other specified symptoms and signs involving the circulatory and respiratory systems: Secondary | ICD-10-CM | POA: Diagnosis not present

## 2021-12-27 DIAGNOSIS — M47812 Spondylosis without myelopathy or radiculopathy, cervical region: Secondary | ICD-10-CM | POA: Diagnosis not present

## 2021-12-27 DIAGNOSIS — C227 Other specified carcinomas of liver: Secondary | ICD-10-CM | POA: Diagnosis not present

## 2021-12-27 DIAGNOSIS — D179 Benign lipomatous neoplasm, unspecified: Secondary | ICD-10-CM | POA: Diagnosis not present

## 2021-12-27 DIAGNOSIS — F0280 Dementia in other diseases classified elsewhere without behavioral disturbance: Secondary | ICD-10-CM | POA: Diagnosis not present

## 2022-01-14 ENCOUNTER — Emergency Department (HOSPITAL_COMMUNITY): Payer: Medicare Other

## 2022-01-14 ENCOUNTER — Inpatient Hospital Stay (HOSPITAL_COMMUNITY)
Admission: EM | Admit: 2022-01-14 | Discharge: 2022-01-18 | DRG: 481 | Disposition: A | Discharge status: Skilled Nursing Facility | Payer: Medicare Other | Attending: Family Medicine | Admitting: Family Medicine

## 2022-01-14 DIAGNOSIS — Z79899 Other long term (current) drug therapy: Secondary | ICD-10-CM

## 2022-01-14 DIAGNOSIS — Z8249 Family history of ischemic heart disease and other diseases of the circulatory system: Secondary | ICD-10-CM | POA: Diagnosis not present

## 2022-01-14 DIAGNOSIS — G4700 Insomnia, unspecified: Secondary | ICD-10-CM | POA: Diagnosis not present

## 2022-01-14 DIAGNOSIS — F0280 Dementia in other diseases classified elsewhere without behavioral disturbance: Secondary | ICD-10-CM | POA: Diagnosis present

## 2022-01-14 DIAGNOSIS — M25552 Pain in left hip: Secondary | ICD-10-CM | POA: Diagnosis not present

## 2022-01-14 DIAGNOSIS — Z825 Family history of asthma and other chronic lower respiratory diseases: Secondary | ICD-10-CM | POA: Diagnosis not present

## 2022-01-14 DIAGNOSIS — F418 Other specified anxiety disorders: Secondary | ICD-10-CM | POA: Diagnosis not present

## 2022-01-14 DIAGNOSIS — S72142D Displaced intertrochanteric fracture of left femur, subsequent encounter for closed fracture with routine healing: Secondary | ICD-10-CM | POA: Diagnosis not present

## 2022-01-14 DIAGNOSIS — Z96652 Presence of left artificial knee joint: Secondary | ICD-10-CM | POA: Diagnosis present

## 2022-01-14 DIAGNOSIS — Z043 Encounter for examination and observation following other accident: Secondary | ICD-10-CM | POA: Diagnosis not present

## 2022-01-14 DIAGNOSIS — Z809 Family history of malignant neoplasm, unspecified: Secondary | ICD-10-CM | POA: Diagnosis not present

## 2022-01-14 DIAGNOSIS — Z20822 Contact with and (suspected) exposure to covid-19: Secondary | ICD-10-CM | POA: Diagnosis not present

## 2022-01-14 DIAGNOSIS — Z8261 Family history of arthritis: Secondary | ICD-10-CM

## 2022-01-14 DIAGNOSIS — T148XXA Other injury of unspecified body region, initial encounter: Secondary | ICD-10-CM

## 2022-01-14 DIAGNOSIS — I959 Hypotension, unspecified: Secondary | ICD-10-CM | POA: Diagnosis not present

## 2022-01-14 DIAGNOSIS — D62 Acute posthemorrhagic anemia: Secondary | ICD-10-CM | POA: Diagnosis not present

## 2022-01-14 DIAGNOSIS — S72142A Displaced intertrochanteric fracture of left femur, initial encounter for closed fracture: Secondary | ICD-10-CM | POA: Diagnosis not present

## 2022-01-14 DIAGNOSIS — F0390 Unspecified dementia without behavioral disturbance: Secondary | ICD-10-CM | POA: Diagnosis not present

## 2022-01-14 DIAGNOSIS — Z810 Family history of intellectual disabilities: Secondary | ICD-10-CM

## 2022-01-14 DIAGNOSIS — R739 Hyperglycemia, unspecified: Secondary | ICD-10-CM | POA: Diagnosis present

## 2022-01-14 DIAGNOSIS — K219 Gastro-esophageal reflux disease without esophagitis: Secondary | ICD-10-CM | POA: Diagnosis not present

## 2022-01-14 DIAGNOSIS — J45909 Unspecified asthma, uncomplicated: Secondary | ICD-10-CM | POA: Diagnosis not present

## 2022-01-14 DIAGNOSIS — N1832 Chronic kidney disease, stage 3b: Secondary | ICD-10-CM

## 2022-01-14 DIAGNOSIS — F419 Anxiety disorder, unspecified: Secondary | ICD-10-CM | POA: Diagnosis not present

## 2022-01-14 DIAGNOSIS — G309 Alzheimer's disease, unspecified: Secondary | ICD-10-CM | POA: Diagnosis not present

## 2022-01-14 DIAGNOSIS — R52 Pain, unspecified: Secondary | ICD-10-CM | POA: Diagnosis not present

## 2022-01-14 DIAGNOSIS — D649 Anemia, unspecified: Secondary | ICD-10-CM | POA: Diagnosis not present

## 2022-01-14 DIAGNOSIS — M2578 Osteophyte, vertebrae: Secondary | ICD-10-CM | POA: Diagnosis not present

## 2022-01-14 DIAGNOSIS — R0689 Other abnormalities of breathing: Secondary | ICD-10-CM | POA: Diagnosis not present

## 2022-01-14 DIAGNOSIS — Z833 Family history of diabetes mellitus: Secondary | ICD-10-CM | POA: Diagnosis not present

## 2022-01-14 DIAGNOSIS — Y92009 Unspecified place in unspecified non-institutional (private) residence as the place of occurrence of the external cause: Secondary | ICD-10-CM | POA: Diagnosis not present

## 2022-01-14 DIAGNOSIS — W19XXXA Unspecified fall, initial encounter: Secondary | ICD-10-CM

## 2022-01-14 DIAGNOSIS — M545 Low back pain, unspecified: Secondary | ICD-10-CM | POA: Diagnosis not present

## 2022-01-14 DIAGNOSIS — Z7401 Bed confinement status: Secondary | ICD-10-CM | POA: Diagnosis not present

## 2022-01-14 DIAGNOSIS — I471 Supraventricular tachycardia: Secondary | ICD-10-CM | POA: Diagnosis not present

## 2022-01-14 DIAGNOSIS — M069 Rheumatoid arthritis, unspecified: Secondary | ICD-10-CM | POA: Diagnosis not present

## 2022-01-14 DIAGNOSIS — Z4789 Encounter for other orthopedic aftercare: Secondary | ICD-10-CM | POA: Diagnosis not present

## 2022-01-14 DIAGNOSIS — Z66 Do not resuscitate: Secondary | ICD-10-CM | POA: Diagnosis not present

## 2022-01-14 DIAGNOSIS — W1830XA Fall on same level, unspecified, initial encounter: Secondary | ICD-10-CM | POA: Diagnosis present

## 2022-01-14 DIAGNOSIS — K5900 Constipation, unspecified: Secondary | ICD-10-CM | POA: Diagnosis not present

## 2022-01-14 DIAGNOSIS — G2581 Restless legs syndrome: Secondary | ICD-10-CM | POA: Diagnosis present

## 2022-01-14 DIAGNOSIS — R279 Unspecified lack of coordination: Secondary | ICD-10-CM | POA: Diagnosis not present

## 2022-01-14 DIAGNOSIS — Z87891 Personal history of nicotine dependence: Secondary | ICD-10-CM

## 2022-01-14 DIAGNOSIS — I10 Essential (primary) hypertension: Secondary | ICD-10-CM | POA: Diagnosis not present

## 2022-01-14 DIAGNOSIS — S7292XA Unspecified fracture of left femur, initial encounter for closed fracture: Secondary | ICD-10-CM

## 2022-01-14 DIAGNOSIS — S7292XD Unspecified fracture of left femur, subsequent encounter for closed fracture with routine healing: Secondary | ICD-10-CM | POA: Diagnosis not present

## 2022-01-14 DIAGNOSIS — M549 Dorsalgia, unspecified: Secondary | ICD-10-CM | POA: Diagnosis not present

## 2022-01-14 DIAGNOSIS — M47816 Spondylosis without myelopathy or radiculopathy, lumbar region: Secondary | ICD-10-CM | POA: Diagnosis not present

## 2022-01-14 DIAGNOSIS — R7989 Other specified abnormal findings of blood chemistry: Secondary | ICD-10-CM | POA: Diagnosis present

## 2022-01-14 DIAGNOSIS — M7989 Other specified soft tissue disorders: Secondary | ICD-10-CM | POA: Diagnosis not present

## 2022-01-14 DIAGNOSIS — M810 Age-related osteoporosis without current pathological fracture: Secondary | ICD-10-CM | POA: Diagnosis not present

## 2022-01-14 LAB — URINALYSIS, ROUTINE W REFLEX MICROSCOPIC
Bilirubin Urine: NEGATIVE
Glucose, UA: NEGATIVE mg/dL
Hgb urine dipstick: NEGATIVE
Ketones, ur: NEGATIVE mg/dL
Leukocytes,Ua: NEGATIVE
Nitrite: POSITIVE — AB
Protein, ur: 30 mg/dL — AB
Specific Gravity, Urine: 1.025 (ref 1.005–1.030)
pH: 5 (ref 5.0–8.0)

## 2022-01-14 LAB — CBC WITH DIFFERENTIAL/PLATELET
Abs Immature Granulocytes: 0.03 10*3/uL (ref 0.00–0.07)
Basophils Absolute: 0 10*3/uL (ref 0.0–0.1)
Basophils Relative: 0 %
Eosinophils Absolute: 0 10*3/uL (ref 0.0–0.5)
Eosinophils Relative: 0 %
HCT: 38.9 % (ref 36.0–46.0)
Hemoglobin: 12.4 g/dL (ref 12.0–15.0)
Immature Granulocytes: 0 %
Lymphocytes Relative: 17 %
Lymphs Abs: 1.5 10*3/uL (ref 0.7–4.0)
MCH: 31.1 pg (ref 26.0–34.0)
MCHC: 31.9 g/dL (ref 30.0–36.0)
MCV: 97.5 fL (ref 80.0–100.0)
Monocytes Absolute: 0.6 10*3/uL (ref 0.1–1.0)
Monocytes Relative: 7 %
Neutro Abs: 6.6 10*3/uL (ref 1.7–7.7)
Neutrophils Relative %: 76 %
Platelets: 182 10*3/uL (ref 150–400)
RBC: 3.99 MIL/uL (ref 3.87–5.11)
RDW: 13.6 % (ref 11.5–15.5)
WBC: 8.7 10*3/uL (ref 4.0–10.5)
nRBC: 0 % (ref 0.0–0.2)

## 2022-01-14 LAB — BASIC METABOLIC PANEL
Anion gap: 10 (ref 5–15)
BUN: 15 mg/dL (ref 8–23)
CO2: 21 mmol/L — ABNORMAL LOW (ref 22–32)
Calcium: 8.5 mg/dL — ABNORMAL LOW (ref 8.9–10.3)
Chloride: 106 mmol/L (ref 98–111)
Creatinine, Ser: 1.31 mg/dL — ABNORMAL HIGH (ref 0.44–1.00)
GFR, Estimated: 39 mL/min — ABNORMAL LOW (ref >60)
Glucose, Bld: 147 mg/dL — ABNORMAL HIGH (ref 70–99)
Potassium: 4.5 mmol/L (ref 3.5–5.1)
Sodium: 137 mmol/L (ref 135–145)

## 2022-01-14 LAB — RESP PANEL BY RT-PCR (FLU A&B, COVID) ARPGX2
Influenza A by PCR: NEGATIVE
Influenza B by PCR: NEGATIVE
SARS Coronavirus 2 by RT PCR: NEGATIVE

## 2022-01-14 MED ORDER — FENTANYL CITRATE PF 50 MCG/ML IJ SOSY
25.0000 ug | PREFILLED_SYRINGE | Freq: Once | INTRAMUSCULAR | Status: AC
  Administered 2022-01-14: 25 ug via INTRAVENOUS
  Filled 2022-01-14: qty 1

## 2022-01-14 MED ORDER — TRANEXAMIC ACID-NACL 1000-0.7 MG/100ML-% IV SOLN
1000.0000 mg | INTRAVENOUS | Status: AC
  Administered 2022-01-15: 1000 mg via INTRAVENOUS
  Filled 2022-01-14 (×2): qty 100

## 2022-01-14 MED ORDER — FENTANYL CITRATE PF 50 MCG/ML IJ SOSY
50.0000 ug | PREFILLED_SYRINGE | Freq: Once | INTRAMUSCULAR | Status: AC
  Administered 2022-01-14: 50 ug via INTRAVENOUS
  Filled 2022-01-14: qty 1

## 2022-01-14 MED ORDER — PANTOPRAZOLE SODIUM 40 MG PO TBEC
40.0000 mg | DELAYED_RELEASE_TABLET | Freq: Every day | ORAL | Status: DC
  Administered 2022-01-14 – 2022-01-18 (×4): 40 mg via ORAL
  Filled 2022-01-14 (×5): qty 1

## 2022-01-14 MED ORDER — LACTATED RINGERS IV SOLN: INTRAVENOUS | Status: DC

## 2022-01-14 MED ORDER — MORPHINE SULFATE (PF) 2 MG/ML IV SOLN
2.0000 mg | INTRAVENOUS | Status: DC | PRN
  Administered 2022-01-14 – 2022-01-15 (×3): 2 mg via INTRAVENOUS
  Filled 2022-01-14 (×3): qty 1

## 2022-01-14 MED ORDER — CEFAZOLIN SODIUM-DEXTROSE 2-4 GM/100ML-% IV SOLN
2.0000 g | INTRAVENOUS | Status: AC
  Administered 2022-01-15: 2 g via INTRAVENOUS

## 2022-01-14 NOTE — Assessment & Plan Note (Signed)
CBG 147, this is possibly reactive Continue to monitor CBG with morning labs

## 2022-01-14 NOTE — ED Notes (Signed)
Patient in greater pain and has received no relief from the morphine. MD is aware

## 2022-01-14 NOTE — ED Notes (Signed)
Pt daughter, who is currently at bedside, states that pt is on hospice care.

## 2022-01-14 NOTE — ED Provider Notes (Signed)
[facility] EMERGENCY DEPARTMENT Provider Note   CSN: [id number] Arrival date & time: 01/14/22  1503     History  Chief Complaint  Patient presents with   Mallory Steele is a 88 y.o. female brought in by EMS for an unwitnessed fall that occurred outside at home on the sidewalk.  Patient lives at home with her daughter and is currently on hospice care due to Alzheimer's disease.  Per daughter, she rounded the corner just after patient fell and she was lying on her back.  Patient was in significant pain and EMS was called.  Daughter states patient was complaining of severe pain of her left leg.  Patient intermittently goes in and out of lucidity and able to answer questions.  She does frequently endorse left leg pain herself.  She is not on any blood thinners.  Further history limited due to patient's dementia.   Fall      Home Medications Prior to Admission medications   Medication Sig Start Date End Date Taking? Authorizing Provider  clobetasol cream (TEMOVATE) 4.49 % Apply 1 application topically 2 (two) times daily. 06/01/18   Evelina Dun A, FNP  donepezil (ARICEPT) 10 MG tablet TAKE 1 TABLET BY MOUTH EVERY DAY 04/06/21   Dettinger, Fransisca Kaufmann, MD  DULoxetine (CYMBALTA) 60 MG capsule Take 2 capsules (120 mg total) by mouth daily. (NEEDS TO BE SEEN BEFORE NEXT REFILL) 02/02/21   Dettinger, Fransisca Kaufmann, MD  ferrous sulfate 325 (65 FE) MG tablet TAKE 1 TABLET BY MOUTH EVERY DAY WITH BREAKFAST 04/06/21   Dettinger, Fransisca Kaufmann, MD  FOLIC ACID PO Take by mouth.    [provider]  Incontinence Supply Disposable (DEPEND UNDERWEAR LARGE) MISC 1 each by Does not apply route 4 (four) times daily as needed. 01/14/20   Sharion Balloon, FNP  LORazepam (ATIVAN) 0.5 MG tablet Take 1 tablet (0.5 mg total) by mouth 2 (two) times daily as needed for anxiety. 05/07/21   Dettinger, Fransisca Kaufmann, MD  meclizine (ANTIVERT) 25 MG tablet TAKE 1 TABLET (25 MG TOTAL) BY MOUTH 3 (THREE) TIMES DAILY AS  NEEDED FOR DIZZINESS. 06/29/18   Dettinger, Fransisca Kaufmann, MD  omeprazole (PRILOSEC) 20 MG capsule Take 1 capsule (20 mg total) by mouth daily. (NEEDS TO BE SEEN BEFORE NEXT REFILL) 02/02/21   Dettinger, Fransisca Kaufmann, MD  risperiDONE (RISPERDAL) 2 MG tablet Take 1 tablet (2 mg total) by mouth 2 (two) times daily. 03/04/21   Dettinger, Fransisca Kaufmann, MD  rOPINIRole (REQUIP) 0.25 MG tablet Take 1 tablet (0.25 mg total) by mouth 3 (three) times daily. 04/06/21   Dettinger, Fransisca Kaufmann, MD  traZODone (DESYREL) 50 MG tablet TAKE 1 TO 2 TABLETS BY MOUTH AT BEDTIME AS NEEDED FOR SLEEP 06/22/21   Dettinger, Fransisca Kaufmann, MD  vitamin B-12 (CYANOCOBALAMIN) 250 MCG tablet TAKE 1 TABLET BY MOUTH EVERY DAY 04/17/19   Dettinger, Fransisca Kaufmann, MD  zinc gluconate 50 MG tablet Take 1 tablet (50 mg total) by mouth daily. 05/23/20   Dettinger, Fransisca Kaufmann, MD      Allergies    Patient has no known allergies.    Review of Systems   Review of Systems  Physical Exam Updated Vital Signs BP (!) 117/91    Pulse 69    Temp 98.2 F (36.8 C) (Oral)    Resp 11    SpO2 100%  Physical Exam Vitals and nursing note reviewed.  Constitutional:      General: She is  not in acute distress.    Appearance: She is not ill-appearing.  HENT:     Head: Atraumatic.  Eyes:     Conjunctiva/sclera: Conjunctivae normal.  Cardiovascular:     Rate and Rhythm: Normal rate and regular rhythm.     Pulses: Normal pulses.          Dorsalis pedis pulses are 2+ on the right side and 2+ on the left side.     Heart sounds: No murmur heard. Pulmonary:     Effort: Pulmonary effort is normal. No respiratory distress.     Breath sounds: Normal breath sounds.  Abdominal:     General: Abdomen is flat. There is no distension.     Palpations: Abdomen is soft.     Tenderness: There is no abdominal tenderness.  Musculoskeletal:        General: Normal range of motion.     Cervical back: Normal range of motion.     Comments: Left thigh left leg appears shortened, with palpable  lateral deformity and swelling.  2+ DP pulses.  Sensation intact.  Right leg with full passive ROM without any pain.  Sensation intact.  +2 DP pulses.  Skin:    General: Skin is warm and dry.     Capillary Refill: Capillary refill takes less than 2 seconds.  Neurological:     General: No focal deficit present.     Mental Status: She is alert.  Psychiatric:        Mood and Affect: Mood normal.    ED Results / Procedures / Treatments   Labs (all labs ordered are listed, but only abnormal results are displayed) Labs Reviewed  BASIC METABOLIC PANEL  CBC WITH DIFFERENTIAL/PLATELET  URINALYSIS, ROUTINE W REFLEX MICROSCOPIC    EKG None  Radiology DG Pelvis 1-2 Views  Result Date: 01/14/2022 CLINICAL DATA:  Unwitnessed fall. EXAM: PELVIS - 1-2 VIEW COMPARISON:  None. FINDINGS: Redemonstration of the left intertrochanteric fracture with superior displacement of the distal femur. No other appreciable fracture or dislocation. Evaluation of sacrum is limited due to overlying bowel contents. Advanced degenerate disc disease of the lower lumbar spine. IMPRESSION: Left femoral intertrochanteric fracture. Advanced degenerate disc disease of the lumbar spine. Electronically Signed   By: Keane Police D.O.   On: 01/14/2022 17:41   CT Thoracic Spine Wo Contrast  Result Date: 01/14/2022 CLINICAL DATA:  [place].  Back pain.  Left hip fracture. EXAM: CT THORACIC AND LUMBAR SPINE WITHOUT CONTRAST TECHNIQUE: Multidetector CT imaging of the thoracic and lumbar spine was performed without contrast. Multiplanar CT image reconstructions were also generated. RADIATION DOSE REDUCTION: This exam was performed according to the departmental dose-optimization program which includes automated exposure control, adjustment of the mA and/or kV according to patient size and/or use of iterative reconstruction technique. COMPARISON:  None. FINDINGS: CT THORACIC SPINE FINDINGS Alignment: Normal Vertebrae: Advanced degenerative  changes and osteoporosis no acute fracture. The facets are normally aligned. No facet or laminar fractures. The posterior ribs are intact. Paraspinal and other soft tissues: No significant paraspinal findings. No lung lesions or mediastinal hematoma. Disc levels: The spinal canal is fairly generous. No canal stenosis. CT LUMBAR SPINE FINDINGS Segmentation: There are five lumbar type vertebral bodies. The last full intervertebral disc space is labeled L5-S1. Alignment: Severe degenerative lumbar spondylosis and scoliosis. Vertebrae: No acute fracture is identified. Extensive discogenic sclerosis. Paraspinal and other soft tissues: No significant paraspinal findings. Disc levels: Severe degenerative lumbar spondylosis with multilevel disc disease and facet disease. Mild  multilevel lateral recess and foraminal stenosis. No significant canal stenosis. IMPRESSION: 1. Advanced degenerative changes and osteoporosis but no acute fracture of the thoracic or lumbar spine. 2. Severe degenerative lumbar spondylosis and scoliosis with multilevel disc disease and facet disease. 3. No significant canal stenosis. Electronically Signed   By: Marijo Sanes M.D.   On: 01/14/2022 17:52   CT Lumbar Spine Wo Contrast  Result Date: 01/14/2022 CLINICAL DATA:  [place].  Back pain.  Left hip fracture. EXAM: CT THORACIC AND LUMBAR SPINE WITHOUT CONTRAST TECHNIQUE: Multidetector CT imaging of the thoracic and lumbar spine was performed without contrast. Multiplanar CT image reconstructions were also generated. RADIATION DOSE REDUCTION: This exam was performed according to the departmental dose-optimization program which includes automated exposure control, adjustment of the mA and/or kV according to patient size and/or use of iterative reconstruction technique. COMPARISON:  None. FINDINGS: CT THORACIC SPINE FINDINGS Alignment: Normal Vertebrae: Advanced degenerative changes and osteoporosis no acute fracture. The facets are normally aligned.  No facet or laminar fractures. The posterior ribs are intact. Paraspinal and other soft tissues: No significant paraspinal findings. No lung lesions or mediastinal hematoma. Disc levels: The spinal canal is fairly generous. No canal stenosis. CT LUMBAR SPINE FINDINGS Segmentation: There are five lumbar type vertebral bodies. The last full intervertebral disc space is labeled L5-S1. Alignment: Severe degenerative lumbar spondylosis and scoliosis. Vertebrae: No acute fracture is identified. Extensive discogenic sclerosis. Paraspinal and other soft tissues: No significant paraspinal findings. Disc levels: Severe degenerative lumbar spondylosis with multilevel disc disease and facet disease. Mild multilevel lateral recess and foraminal stenosis. No significant canal stenosis. IMPRESSION: 1. Advanced degenerative changes and osteoporosis but no acute fracture of the thoracic or lumbar spine. 2. Severe degenerative lumbar spondylosis and scoliosis with multilevel disc disease and facet disease. 3. No significant canal stenosis. Electronically Signed   By: Marijo Sanes M.D.   On: 01/14/2022 17:52   DG Femur Min 2 Views Left  Result Date: 01/14/2022 CLINICAL DATA:  Fall, deformity EXAM: LEFT FEMUR 2 VIEWS COMPARISON:  None. FINDINGS: There is a displaced comminuted intertrochanteric fracture of the left femur. Distal femur is intact. Left knee arthroplasty. IMPRESSION: Displaced comminuted intertrochanteric fracture of the left femur. Electronically Signed   By: Keane Police D.O.   On: 01/14/2022 17:39    Procedures .Critical Care Performed by: Tonye Pearson, PA-C Authorized by: Tonye Pearson, PA-C   Critical care provider statement:    Critical care time (minutes):  30   Critical care start time:  01/14/2022 4:30 PM   Critical care end time:  01/14/2022 5:00 PM   Critical care was necessary to treat or prevent imminent or life-threatening deterioration of the following conditions:  Trauma   Critical  care was time spent personally by me on the following activities:  Development of treatment plan with patient or surrogate, discussions with consultants, evaluation of patient's response to treatment, examination of patient, ordering and review of laboratory studies, ordering and review of radiographic studies, ordering and performing treatments and interventions, pulse oximetry, re-evaluation of patient's condition, review of old charts and obtaining history from patient or surrogate   I assumed direction of critical care for this patient from another provider in my specialty: no     Care discussed with: admitting provider      Medications Ordered in ED Medications  fentaNYL (SUBLIMAZE) injection 50 mcg (50 mcg Intravenous Given 01/14/22 1659)    ED Course/ Medical Decision Making/ A&P  Medical Decision Making Amount and/or Complexity of Data Reviewed Labs: ordered. Radiology: ordered.  Risk Prescription drug management. Decision regarding hospitalization.   History:  Per HPI Social determinants of health: Patient is a hospice and has Alzheimer's  Initial impression:  This patient presents to the ED for concern of leg pain due to fall, this involves an extensive number of treatment options, and is a complaint that carries with it a high risk of complications and morbidity.    Elderly chronically ill-appearing 88 year old woman lying uncomfortably on exam bed and complaining of pain to her left upper leg.  Exam was significant for shortened leg compared to right with palpable swelling and deformity over the proximal femur.  Will obtain x-ray of pelvis, hip and femur.  Given that patient was found lying on her back and cannot describe the fall in detail, we will also obtain CT head, C-spine, L-spine and T-spine.  Patient given 50 mcg fentanyl with pain relief.   Lab Tests and EKG:  I Ordered, reviewed, and interpreted labs and EKG.  The pertinent results  include:  BMP with elevated creatinine, at her baseline Urinalysis without evidence of infection Respiratory panel negative CBC without leukocytosis   Imaging Studies ordered:  I ordered imaging studies including  CT of head, C, T, L-spine all negative for acute fractures or traumatic injury X-ray of left pelvis and femur with displaced comminuted intertrochanteric fracture of left femur I independently visualized and interpreted imaging and I agree with the radiologist interpretation.    Cardiac Monitoring:  The patient was maintained on a cardiac monitor.  I personally viewed and interpreted the cardiac monitored which showed an underlying rhythm of: NSR   Medicines ordered and prescription drug management:  I ordered medication including: 50 mcg fentanyl x2 and fentanyl 25 mcg for pain relief LR continuous infusion at 75 mils per hour Reevaluation of the patient after these medicines showed that the patient improved I have reviewed the patients home medicines and have made adjustments as needed   Critical Interventions:  Trauma as described above  Consultations Obtained:  I requested consultation with orthopedics and spoke with Dr. Amedeo Kinsman,  and discussed lab and imaging findings as well as pertinent plan.  We discussed that since patient is hospice patient that we should talk to patient's daughter who is her health power of attorney to decide whether or not she would prefer patient to undergo surgery or not given that she is a DNR.  After speaking with patient's daughter, she insist that she would like to move ahead with the surgery for patient. Dr. Amedeo Kinsman requests screening labs to be done and to start patient on IV fluids.  She is to be n.p.o. after midnight to prep for surgery.  He request patient be admitted to medicine and surgery will be performed tomorrow.  Disposition:  After consideration of the diagnostic results, physical exam, history and the patients response to  treatment feel that the patent would benefit from admission with surgery.   Displaced comminuted intertrochanteric fracture of the left femur: Patient's daughter wishes to proceed with orthopedic surgery.  Dr. Josephine Cables agrees to admit patient pending patient's surgery for tomorrow.  Screening labs were ordered.  Ultimate treatment and disposition to be decided by admitting hospitalist and orthopedic surgeon.   Final Clinical Impression(s) / ED Diagnoses Final diagnoses:  Closed displaced intertrochanteric fracture of left femur, initial encounter ([place])    Rx / DC Orders ED Discharge Orders     None  Tonye Pearson, [place] 01/14/22 2312    Fredia Sorrow, MD 01/21/22 [id number]

## 2022-01-14 NOTE — H&P (Addendum)
History and Physical    Patient: DEMIA Steele HAL:[id number] DOB: 25-Dec-1931 DOA: 01/14/2022 DOS: the patient was seen and examined on 01/15/2022 PCP: Dettinger, Fransisca Kaufmann, MD  Patient coming from: Home  Chief Complaint:  Chief Complaint  Patient presents with   Fall    HPI: Mallory Steele is a 88 y.o. female with medical history significant of dementia, GERD, anxiety, RLS who presents to the emergency department via EMS due to a fall sustained at home.  Patient states that she has had about 3 falls within the last couple of days, with last fall being today.  Patient states that she landed on her left side with subsequent pain on same side.  EMS was activated and patient was sent to the ED for further evaluation and management.  At baseline, patient states that she ambulates with a walker and she lives with daughter.  She denies chest pain, shortness of breath, headache, fever, chills, palpitations, nausea, vomiting, abdominal pain.  ED course: In the emergency department, patient presents with tachypnea, BP was 150/73, but other vital signs were within normal range.  Work-up in the ED showed normal CBC, BUN/creatinine was 15/1.31 (baseline creatinine at 1.2-1.3).  CBG 147. Several imaging studies including CT cervical spine and CT of head without contrast, CT thoracic spine and CT lumbar spine without contrast showed no acute fracture. Left femoral x-ray showed displaced comminuted intertrochanteric fracture of the left femoral. She was treated with IV fentanyl, IV hydration was provided.  Orthopedic surgeon (Dr. Amedeo Kinsman) was consulted and recommended admitting patient with plan to possibly take patient to the OR in the morning per ED PA.  Hospitalist was asked to admit patient for further evaluation and management.  Review of Systems: As mentioned in the history of present illness. All other systems reviewed and are negative. Past Medical History:  Diagnosis Date   Anxiety    Dementia [facility])     Past Surgical History:  Procedure Laterality Date   APPENDECTOMY  1947   COLON SURGERY  [phone]   JOINT REPLACEMENT     knee replaced   Social History:  reports that she quit smoking about 36 years ago. Her smoking use included cigarettes. She has a 0.75 pack-year smoking history. She has never used smokeless tobacco. She reports current alcohol use of about 1.0 standard drink per week. She reports that she does not use drugs.  No Known Allergies  Family History  Problem Relation Age of Onset   Arthritis Mother    Cancer Mother    Arthritis Father    Asthma Father    COPD Father    Arthritis Sister    Birth defects Sister    Diabetes Sister    Hearing loss Sister    Mental retardation Sister    Arthritis Brother    Early death Brother    Heart disease Brother    Hypertension Brother     Prior to Admission medications   Medication Sig Start Date End Date Taking? Authorizing Provider  clobetasol cream (TEMOVATE) 3.41 % Apply 1 application topically 2 (two) times daily. 06/01/18  Yes Hawks, Christy A, FNP  diphenhydramine-acetaminophen (TYLENOL PM) 25-500 MG TABS tablet Take 1 tablet by mouth at bedtime as needed.   Yes [provider]  DULoxetine (CYMBALTA) 60 MG capsule Take 2 capsules (120 mg total) by mouth daily. (NEEDS TO BE SEEN BEFORE NEXT REFILL) Patient taking differently: Take 60 mg by mouth daily. 02/02/21  Yes Dettinger, Fransisca Kaufmann, MD  ferrous sulfate 325 (65 FE) MG tablet TAKE 1 TABLET BY MOUTH EVERY DAY WITH BREAKFAST Patient taking differently: Take 325 mg by mouth daily with breakfast. 04/06/21  Yes Dettinger, Fransisca Kaufmann, MD  HYDROcodone-acetaminophen (NORCO) 10-325 MG tablet Take 1 tablet by mouth every 4 (four) hours as needed for moderate pain. 08/28/21  Yes [provider]  Incontinence Supply Disposable (DEPEND UNDERWEAR LARGE) MISC 1 each by Does not apply route 4 (four) times daily as needed. 01/14/20  Yes Hawks, Christy A, FNP   LORazepam (ATIVAN) 1 MG tablet Take 1 mg by mouth See admin instructions. Take 1/2 to 2 tablets by mouth every 2 hours as needed  agitation. 07/22/21  Yes [provider]  meclizine (ANTIVERT) 25 MG tablet TAKE 1 TABLET (25 MG TOTAL) BY MOUTH 3 (THREE) TIMES DAILY AS NEEDED FOR DIZZINESS. Patient taking differently: Take 25 mg by mouth daily as needed for dizziness. 06/29/18  Yes Dettinger, Fransisca Kaufmann, MD  MUCINEX FAST-MAX DM MAX 20-400 MG/20ML LIQD Take 5 mLs by mouth every 4 (four) hours as needed (mucas releif). 01/13/22  Yes [provider]  omeprazole (PRILOSEC) 20 MG capsule Take 1 capsule (20 mg total) by mouth daily. (NEEDS TO BE SEEN BEFORE NEXT REFILL) 02/02/21  Yes Dettinger, Fransisca Kaufmann, MD  traZODone (DESYREL) 50 MG tablet TAKE 1 TO 2 TABLETS BY MOUTH AT BEDTIME AS NEEDED FOR SLEEP Patient taking differently: Take 50 mg by mouth at bedtime. 06/22/21  Yes Dettinger, Fransisca Kaufmann, MD  vitamin B-12 (CYANOCOBALAMIN) 250 MCG tablet TAKE 1 TABLET BY MOUTH EVERY DAY 04/17/19  Yes Dettinger, Fransisca Kaufmann, MD  donepezil (ARICEPT) 10 MG tablet TAKE 1 TABLET BY MOUTH EVERY DAY Patient not taking: Reported on 01/14/2022 04/06/21   Dettinger, Fransisca Kaufmann, MD  LORazepam (ATIVAN) 0.5 MG tablet Take 1 tablet (0.5 mg total) by mouth 2 (two) times daily as needed for anxiety. Patient not taking: Reported on 01/14/2022 05/07/21   Dettinger, Fransisca Kaufmann, MD  risperiDONE (RISPERDAL) 2 MG tablet Take 1 tablet (2 mg total) by mouth 2 (two) times daily. Patient not taking: Reported on 01/14/2022 03/04/21   Dettinger, Fransisca Kaufmann, MD  rOPINIRole (REQUIP) 0.25 MG tablet Take 1 tablet (0.25 mg total) by mouth 3 (three) times daily. Patient not taking: Reported on 01/14/2022 04/06/21   Dettinger, Fransisca Kaufmann, MD  zinc gluconate 50 MG tablet Take 1 tablet (50 mg total) by mouth daily. Patient not taking: Reported on 01/14/2022 05/23/20   Dettinger, Fransisca Kaufmann, MD    Physical Exam: Vitals:   01/14/22 2325 01/15/22 0000 01/15/22 0032  01/15/22 0411  BP: (!) 156/66 (!) 146/69  132/60  Pulse: 85 80  92  Resp: 18 (!) 24  (!) 23  Temp: 97.6 F (36.4 C) 97.8 F (36.6 C)  98.8 F (37.1 C)  TempSrc: Oral     SpO2: 98% 100%  100%  Weight:   68.7 kg   Height:   5\' 4"  (1.626 m)    General: Elderly female. Awake and alert and oriented x3. Not in any acute distress.  HEENT: NCAT.  PERRLA. EOMI. Sclerae anicteric.  Moist mucosal membranes. Neck: Neck supple without lymphadenopathy. No carotid bruits. No masses palpated.  Cardiovascular: Regular rate with normal S1-S2 sounds. No murmurs, rubs or gallops auscultated. No JVD.  Respiratory: Clear breath sounds.  No accessory muscle use. Abdomen: Soft, nontender, nondistended. Active bowel sounds. No masses or hepatosplenomegaly  Skin: No rashes, lesions, or ulcerations.  Dry, warm to touch. Musculoskeletal:  Tender to palpation of left hip, decreased range of motion of LLE due to pain.  LLE externally rotated and shortened.  2+ dorsalis pedis and radial pulses. Psychiatric: Intact judgment and insight.  Mood appropriate to current condition. Neurologic: No focal neurological deficits. Strength is 5/5 x 4.  CN II - XII grossly intact.   Data Reviewed: There are no new results to review at this time.  Assessment and Plan: * Closed left femoral fracture ([place])- (present on admission) Left femoral x-ray showed displaced comminuted intertrochanteric fracture of the left femoral. General surgery (Dr. Amedeo Kinsman) was already consulted and will see patient in the morning per ED PA Continue IV Dilaudid 0.5 mg every 2 hours as needed for pain Continue fall precaution and neurochecks Consider PT/OT eval and treat status post surgical repair  Fall- (present on admission) Continue fall precaution and neurochecks Consider PT/OT eval and treat status post surgical repair of left femoral fracture Patient is currently on bedrest  Chronic kidney disease, stage 3b ([place]) BUN/creatinine was 15/1.31  (baseline creatinine at 1.2-1.3). Continue gentle hydration Renally adjust medications, avoid nephrotoxic agents/dehydration/hypotension   Hyperglycemia CBG 147, this is possibly reactive Continue to monitor CBG with morning labs  GERD without esophagitis- (present on admission) Continue Protonix   Advance Care Planning: DNR  Consults: Orthopedic surgery  Family Communication: None at bedside  Severity of Illness: The appropriate patient status for this patient is INPATIENT. Inpatient status is judged to be reasonable and necessary in order to provide the required intensity of service to ensure the patient's safety. The patient's presenting symptoms, physical exam findings, and initial radiographic and laboratory data in the context of their chronic comorbidities is felt to place them at high risk for further clinical deterioration. Furthermore, it is not anticipated that the patient will be medically stable for discharge from the hospital within 2 midnights of admission.   * I certify that at the point of admission it is my clinical judgment that the patient will require inpatient hospital care spanning beyond 2 midnights from the point of admission due to high intensity of service, high risk for further deterioration and high frequency of surveillance required.*  Author: Bernadette Hoit, DO 01/15/2022 7:04 AM  For on call review [email or web address].

## 2022-01-14 NOTE — ED Triage Notes (Signed)
Pt arrived via [facility] c/o an unwitnessed fall at home. Per EMS, pt's neighbors saw her laying on the sidewalk. L hip/leg shortened and rotated

## 2022-01-14 NOTE — Assessment & Plan Note (Signed)
Continue fall precaution and neurochecks Consider PT/OT eval and treat status post surgical repair of left femoral fracture Patient is currently on bedrest

## 2022-01-14 NOTE — ED Notes (Signed)
Pt placed on purewick 

## 2022-01-14 NOTE — Assessment & Plan Note (Addendum)
Left femoral x-ray showed displaced comminuted intertrochanteric fracture of the left femoral. General surgery (Dr. Amedeo Kinsman) was already consulted and will see patient in the morning per ED PA Continue IV Dilaudid 0.5 mg every 2 hours as needed for pain Continue fall precaution and neurochecks Consider PT/OT eval and treat status post surgical repair

## 2022-01-14 NOTE — Assessment & Plan Note (Signed)
BUN/creatinine was 15/1.31 (baseline creatinine at 1.2-1.3). Continue gentle hydration Renally adjust medications, avoid nephrotoxic agents/dehydration/hypotension

## 2022-01-14 NOTE — Assessment & Plan Note (Deleted)
Continue Aricept 

## 2022-01-14 NOTE — Assessment & Plan Note (Signed)
Continue Protonix °

## 2022-01-14 NOTE — Assessment & Plan Note (Deleted)
Continue ropinirole 

## 2022-01-15 ENCOUNTER — Inpatient Hospital Stay (HOSPITAL_COMMUNITY): Payer: Medicare Other

## 2022-01-15 ENCOUNTER — Encounter (HOSPITAL_COMMUNITY)
Admission: EM | Disposition: A | Discharge status: Skilled Nursing Facility | Payer: Self-pay | Source: Home / Self Care | Attending: Family Medicine

## 2022-01-15 ENCOUNTER — Other Ambulatory Visit: Payer: Self-pay

## 2022-01-15 ENCOUNTER — Inpatient Hospital Stay (HOSPITAL_COMMUNITY): Payer: Medicare Other | Admitting: Anesthesiology

## 2022-01-15 ENCOUNTER — Encounter (HOSPITAL_COMMUNITY): Payer: Self-pay | Admitting: Internal Medicine

## 2022-01-15 DIAGNOSIS — R739 Hyperglycemia, unspecified: Secondary | ICD-10-CM | POA: Diagnosis not present

## 2022-01-15 DIAGNOSIS — S72142A Displaced intertrochanteric fracture of left femur, initial encounter for closed fracture: Secondary | ICD-10-CM | POA: Diagnosis not present

## 2022-01-15 DIAGNOSIS — F418 Other specified anxiety disorders: Secondary | ICD-10-CM

## 2022-01-15 DIAGNOSIS — N1832 Chronic kidney disease, stage 3b: Secondary | ICD-10-CM | POA: Diagnosis not present

## 2022-01-15 DIAGNOSIS — K219 Gastro-esophageal reflux disease without esophagitis: Secondary | ICD-10-CM | POA: Diagnosis not present

## 2022-01-15 DIAGNOSIS — D649 Anemia, unspecified: Secondary | ICD-10-CM

## 2022-01-15 HISTORY — PX: INTRAMEDULLARY (IM) NAIL INTERTROCHANTERIC: SHX5875

## 2022-01-15 LAB — CBC
HCT: 31.9 % — ABNORMAL LOW (ref 36.0–46.0)
Hemoglobin: 10.2 g/dL — ABNORMAL LOW (ref 12.0–15.0)
MCH: 30.5 pg (ref 26.0–34.0)
MCHC: 32 g/dL (ref 30.0–36.0)
MCV: 95.5 fL (ref 80.0–100.0)
Platelets: 163 10*3/uL (ref 150–400)
RBC: 3.34 MIL/uL — ABNORMAL LOW (ref 3.87–5.11)
RDW: 13.9 % (ref 11.5–15.5)
WBC: 8.9 10*3/uL (ref 4.0–10.5)
nRBC: 0 % (ref 0.0–0.2)

## 2022-01-15 LAB — GLUCOSE, CAPILLARY
Glucose-Capillary: 145 mg/dL — ABNORMAL HIGH (ref 70–99)
Glucose-Capillary: 138 mg/dL — ABNORMAL HIGH (ref 70–99)

## 2022-01-15 LAB — COMPREHENSIVE METABOLIC PANEL
ALT: 13 U/L (ref 0–44)
AST: 23 U/L (ref 15–41)
Albumin: 3.7 g/dL (ref 3.5–5.0)
Alkaline Phosphatase: 123 U/L (ref 38–126)
Anion gap: 10 (ref 5–15)
BUN: 20 mg/dL (ref 8–23)
CO2: 23 mmol/L (ref 22–32)
Calcium: 8.5 mg/dL — ABNORMAL LOW (ref 8.9–10.3)
Chloride: 103 mmol/L (ref 98–111)
Creatinine, Ser: 1.28 mg/dL — ABNORMAL HIGH (ref 0.44–1.00)
GFR, Estimated: 41 mL/min — ABNORMAL LOW (ref >60)
Glucose, Bld: 146 mg/dL — ABNORMAL HIGH (ref 70–99)
Potassium: 4.7 mmol/L (ref 3.5–5.1)
Sodium: 136 mmol/L (ref 135–145)
Total Bilirubin: 0.9 mg/dL (ref 0.3–1.2)
Total Protein: 6.6 g/dL (ref 6.5–8.1)

## 2022-01-15 LAB — SURGICAL PCR SCREEN
MRSA, PCR: NEGATIVE
Staphylococcus aureus: NEGATIVE

## 2022-01-15 LAB — MAGNESIUM: Magnesium: 1.7 mg/dL (ref 1.7–2.4)

## 2022-01-15 LAB — PHOSPHORUS: Phosphorus: 3.5 mg/dL (ref 2.5–4.6)

## 2022-01-15 LAB — ABO/RH: ABO/RH(D): A NEG

## 2022-01-15 SURGERY — FIXATION, FRACTURE, INTERTROCHANTERIC, WITH INTRAMEDULLARY ROD
Anesthesia: Spinal | Site: Hip | Laterality: Left

## 2022-01-15 MED ORDER — LIDOCAINE HCL (PF) 2 % IJ SOLN
INTRAMUSCULAR | Status: AC
  Filled 2022-01-15: qty 5

## 2022-01-15 MED ORDER — DONEPEZIL HCL 5 MG PO TABS: 5.0000 mg | ORAL_TABLET | Freq: Every day | ORAL | Status: DC

## 2022-01-15 MED ORDER — BUPIVACAINE HCL (PF) 0.5 % IJ SOLN
INTRAMUSCULAR | Status: AC
Start: 2022-01-15 — End: ?
  Filled 2022-01-15: qty 30

## 2022-01-15 MED ORDER — PROPOFOL 10 MG/ML IV BOLUS
INTRAVENOUS | Status: AC
  Filled 2022-01-15: qty 20

## 2022-01-15 MED ORDER — ATROPINE SULFATE 0.4 MG/ML IV SOLN
INTRAVENOUS | Status: AC
  Filled 2022-01-15: qty 1

## 2022-01-15 MED ORDER — MAGNESIUM OXIDE -MG SUPPLEMENT 400 (240 MG) MG PO TABS
400.0000 mg | ORAL_TABLET | Freq: Two times a day (BID) | ORAL | Status: AC
  Administered 2022-01-15 – 2022-01-16 (×4): 400 mg via ORAL
  Filled 2022-01-15 (×4): qty 1

## 2022-01-15 MED ORDER — ONDANSETRON HCL 4 MG/2ML IJ SOLN
INTRAMUSCULAR | Status: DC | PRN
  Administered 2022-01-15: 4 mg via INTRAVENOUS

## 2022-01-15 MED ORDER — LACTATED RINGERS IV SOLN: INTRAVENOUS | Status: DC

## 2022-01-15 MED ORDER — PHENYLEPHRINE HCL (PRESSORS) 10 MG/ML IV SOLN
INTRAVENOUS | Status: DC | PRN
  Administered 2022-01-15 (×4): 100 ug via INTRAVENOUS

## 2022-01-15 MED ORDER — HYDROMORPHONE HCL 1 MG/ML IJ SOLN
0.5000 mg | INTRAMUSCULAR | Status: DC | PRN
  Administered 2022-01-15 – 2022-01-18 (×9): 0.5 mg via INTRAVENOUS
  Filled 2022-01-15 (×8): qty 0.5

## 2022-01-15 MED ORDER — PHENYLEPHRINE HCL-NACL 20-0.9 MG/250ML-% IV SOLN
INTRAVENOUS | Status: DC | PRN
  Administered 2022-01-15: 50 ug/min via INTRAVENOUS

## 2022-01-15 MED ORDER — LORAZEPAM 1 MG PO TABS
1.0000 mg | ORAL_TABLET | Freq: Two times a day (BID) | ORAL | Status: DC | PRN
  Administered 2022-01-16 – 2022-01-17 (×3): 1 mg via ORAL
  Filled 2022-01-15 (×3): qty 1

## 2022-01-15 MED ORDER — FENTANYL CITRATE PF 50 MCG/ML IJ SOSY
50.0000 ug | PREFILLED_SYRINGE | Freq: Once | INTRAMUSCULAR | Status: AC
  Administered 2022-01-15: 50 ug via INTRAVENOUS

## 2022-01-15 MED ORDER — OXYCODONE-ACETAMINOPHEN 7.5-325 MG PO TABS
1.0000 | ORAL_TABLET | ORAL | Status: DC | PRN
  Administered 2022-01-15 – 2022-01-17 (×4): 1 via ORAL
  Filled 2022-01-15 (×4): qty 1

## 2022-01-15 MED ORDER — ORAL CARE MOUTH RINSE: 15.0000 mL | Freq: Once | OROMUCOSAL | Status: AC

## 2022-01-15 MED ORDER — DEXAMETHASONE SODIUM PHOSPHATE 10 MG/ML IJ SOLN
INTRAMUSCULAR | Status: AC
  Filled 2022-01-15: qty 1

## 2022-01-15 MED ORDER — ONDANSETRON HCL 4 MG/2ML IJ SOLN
INTRAMUSCULAR | Status: AC
  Filled 2022-01-15: qty 2

## 2022-01-15 MED ORDER — CEFAZOLIN SODIUM-DEXTROSE 2-4 GM/100ML-% IV SOLN
INTRAVENOUS | Status: AC
  Filled 2022-01-15: qty 100

## 2022-01-15 MED ORDER — BUPIVACAINE IN DEXTROSE 0.75-8.25 % IT SOLN
INTRATHECAL | Status: DC | PRN
  Administered 2022-01-15: 1.6 mL via INTRATHECAL

## 2022-01-15 MED ORDER — CHLORHEXIDINE GLUCONATE 0.12 % MT SOLN
15.0000 mL | Freq: Once | OROMUCOSAL | Status: AC
  Administered 2022-01-15: 15 mL via OROMUCOSAL

## 2022-01-15 MED ORDER — RISPERIDONE 1 MG PO TABS
1.0000 mg | ORAL_TABLET | Freq: Two times a day (BID) | ORAL | Status: DC
  Administered 2022-01-15 – 2022-01-17 (×5): 1 mg via ORAL
  Filled 2022-01-15 (×6): qty 1

## 2022-01-15 MED ORDER — FENTANYL CITRATE PF 50 MCG/ML IJ SOSY
PREFILLED_SYRINGE | INTRAMUSCULAR | Status: AC
  Filled 2022-01-15: qty 1

## 2022-01-15 MED ORDER — CHLORHEXIDINE GLUCONATE CLOTH 2 % EX PADS
6.0000 | MEDICATED_PAD | Freq: Every day | CUTANEOUS | Status: DC
  Administered 2022-01-16 – 2022-01-18 (×3): 6 via TOPICAL

## 2022-01-15 MED ORDER — DULOXETINE HCL 30 MG PO CPEP
30.0000 mg | ORAL_CAPSULE | Freq: Every day | ORAL | Status: DC
  Administered 2022-01-16 – 2022-01-18 (×3): 30 mg via ORAL
  Filled 2022-01-15 (×3): qty 1

## 2022-01-15 MED ORDER — FENTANYL CITRATE (PF) 100 MCG/2ML IJ SOLN
INTRAMUSCULAR | Status: AC
  Filled 2022-01-15: qty 2

## 2022-01-15 MED ORDER — CEFAZOLIN SODIUM-DEXTROSE 2-4 GM/100ML-% IV SOLN
2.0000 g | Freq: Three times a day (TID) | INTRAVENOUS | Status: AC
  Administered 2022-01-15 – 2022-01-16 (×3): 2 g via INTRAVENOUS
  Filled 2022-01-15 (×3): qty 100

## 2022-01-15 MED ORDER — FENTANYL CITRATE (PF) 100 MCG/2ML IJ SOLN
INTRAMUSCULAR | Status: DC | PRN
  Administered 2022-01-15: 50 ug via INTRAVENOUS

## 2022-01-15 MED ORDER — DEXAMETHASONE SODIUM PHOSPHATE 10 MG/ML IJ SOLN
INTRAMUSCULAR | Status: DC | PRN
  Administered 2022-01-15: 5 mg via INTRAVENOUS

## 2022-01-15 MED ORDER — PROPOFOL 500 MG/50ML IV EMUL
INTRAVENOUS | Status: DC | PRN
  Administered 2022-01-15: 50 ug/kg/min via INTRAVENOUS

## 2022-01-15 MED ORDER — CEFAZOLIN SODIUM-DEXTROSE 2-4 GM/100ML-% IV SOLN: 2.0000 g | Freq: Three times a day (TID) | INTRAVENOUS | Status: DC

## 2022-01-15 MED ORDER — HYDROMORPHONE HCL 1 MG/ML IJ SOLN: 0.2500 mg | INTRAMUSCULAR | Status: DC | PRN

## 2022-01-15 MED ORDER — ONDANSETRON HCL 4 MG/2ML IJ SOLN: 4.0000 mg | Freq: Once | INTRAMUSCULAR | Status: DC | PRN

## 2022-01-15 MED ORDER — SODIUM CHLORIDE 0.9 % IR SOLN
Status: DC | PRN
  Administered 2022-01-15 (×2): 1000 mL

## 2022-01-15 SURGICAL SUPPLY — 60 items
APL PRP STRL LF DISP 70% ISPRP (MISCELLANEOUS) ×1
BIT DRILL 4.0X280 (BIT) ×1 IMPLANT
BLADE SURG SZ10 CARB STEEL (BLADE) ×4 IMPLANT
BNDG GAUZE ELAST 4 BULKY (GAUZE/BANDAGES/DRESSINGS) ×2 IMPLANT
BRUSH SCRUB EZ W/ULTRADEX 3%PC (MISCELLANEOUS) ×1 IMPLANT
CHLORAPREP W/TINT 26 (MISCELLANEOUS) ×2 IMPLANT
CLOSURE STERI STRIP 1/2 X4 (GAUZE/BANDAGES/DRESSINGS) ×2 IMPLANT
CLOTH BEACON ORANGE TIMEOUT ST (SAFETY) ×2 IMPLANT
COVER LIGHT HANDLE STERIS (MISCELLANEOUS) ×4 IMPLANT
COVER MAYO STAND XLG (MISCELLANEOUS) ×2 IMPLANT
COVER PERINEAL POST (MISCELLANEOUS) ×2 IMPLANT
DECANTER SPIKE VIAL GLASS SM (MISCELLANEOUS) ×2 IMPLANT
DRAPE HALF SHEET 40X57 (DRAPES) ×2 IMPLANT
DRAPE INCISE IOBAN 44X35 STRL (DRAPES) IMPLANT
DRAPE STERI IOBAN 125X83 (DRAPES) ×2 IMPLANT
DRSG MEPILEX BORDER 4X12 (GAUZE/BANDAGES/DRESSINGS) ×1 IMPLANT
DRSG PAD ABDOMINAL 8X10 ST (GAUZE/BANDAGES/DRESSINGS) ×2 IMPLANT
DRSG TEGADERM 4X4.75 (GAUZE/BANDAGES/DRESSINGS) ×8 IMPLANT
ELECT REM PT RETURN 9FT ADLT (ELECTROSURGICAL) ×2
ELECTRODE REM PT RTRN 9FT ADLT (ELECTROSURGICAL) ×1 IMPLANT
GAUZE SPONGE 4X4 12PLY STRL (GAUZE/BANDAGES/DRESSINGS) ×2 IMPLANT
GAUZE XEROFORM 1X8 LF (GAUZE/BANDAGES/DRESSINGS) ×3 IMPLANT
GLOVE SRG 8 PF TXTR STRL LF DI (GLOVE) ×1 IMPLANT
GLOVE SURG POLYISO LF SZ8 (GLOVE) ×4 IMPLANT
GLOVE SURG UNDER POLY LF SZ7 (GLOVE) ×4 IMPLANT
GLOVE SURG UNDER POLY LF SZ8 (GLOVE) ×2
GOWN STRL REUS W/ TWL XL LVL3 (GOWN DISPOSABLE) ×1 IMPLANT
GOWN STRL REUS W/TWL LRG LVL3 (GOWN DISPOSABLE) ×2 IMPLANT
GOWN STRL REUS W/TWL XL LVL3 (GOWN DISPOSABLE) ×2
GUIDE PIN 3.2X330 (PIN) ×2
GUIDEWIRE BALL NOSE 3.0X900 (WIRE) ×2
GUIDEWIRE ORTH 900X3XBALL NOSE (WIRE) IMPLANT
INST SET MAJOR BONE (KITS) ×2 IMPLANT
KIT BLADEGUARD II DBL (SET/KITS/TRAYS/PACK) ×2 IMPLANT
KIT TURNOVER CYSTO (KITS) ×2 IMPLANT
MANIFOLD NEPTUNE II (INSTRUMENTS) ×2 IMPLANT
MARKER SKIN DUAL TIP RULER LAB (MISCELLANEOUS) ×2 IMPLANT
NAIL IT  125DX11X36 LT (Nail) ×2 IMPLANT
NAIL IT 125DX11X36 LT (Nail) IMPLANT
NDL HYPO 21X1.5 SAFETY (NEEDLE) ×1 IMPLANT
NEEDLE HYPO 21X1.5 SAFETY (NEEDLE) ×2 IMPLANT
NS IRRIG 1000ML POUR BTL (IV SOLUTION) ×2 IMPLANT
PACK BASIC III (CUSTOM PROCEDURE TRAY) ×2
PACK SRG BSC III STRL LF ECLPS (CUSTOM PROCEDURE TRAY) ×1 IMPLANT
PAD ARMBOARD 7.5X6 YLW CONV (MISCELLANEOUS) ×2 IMPLANT
PENCIL SMOKE EVACUATOR COATED (MISCELLANEOUS) ×2 IMPLANT
PIN GUIDE 3.2X330 (PIN) IMPLANT
SCREW LAG GALILEO 10.5X105 (Screw) ×1 IMPLANT
SCREW LOCK CORT 5X36 (Screw) ×1 IMPLANT
SET BASIN LINEN APH (SET/KITS/TRAYS/PACK) ×2 IMPLANT
SPONGE T-LAP 18X18 ~~LOC~~+RFID (SPONGE) ×4 IMPLANT
SUT MNCRL AB 4-0 PS2 18 (SUTURE) ×2 IMPLANT
SUT MON AB 2-0 CT1 36 (SUTURE) ×2 IMPLANT
SUT VIC AB 0 CT1 27 (SUTURE) ×2
SUT VIC AB 0 CT1 27XBRD ANTBC (SUTURE) ×1 IMPLANT
SYR 30ML LL (SYRINGE) ×2 IMPLANT
SYR BULB IRRIG 60ML STRL (SYRINGE) ×4 IMPLANT
TOOL ACTIVATION (INSTRUMENTS) ×1 IMPLANT
TRAY FOLEY MTR SLVR 16FR STAT (SET/KITS/TRAYS/PACK) ×2 IMPLANT
YANKAUER SUCT BULB TIP NO VENT (SUCTIONS) ×2 IMPLANT

## 2022-01-15 NOTE — Progress Notes (Signed)
Transition of Care (TOC) -30 day Note       Patient Details  Name: Mallory Steele MRN: [id number] Date of Birth: Dec 14, 1931   Transition of Care [facility]) CM/SW Contact  Name: Shade Flood Phone Number: [phone] Date: 01/15/2022 Time: 1333    To Whom it May Concern:   Please be advised that the above patient will require a short-term nursing home stay, anticipated 30 days or less rehabilitation and strengthening. The plan is for return home.

## 2022-01-15 NOTE — Plan of Care (Signed)
°  Problem: Activity: Goal: Ability to ambulate and perform ADLs will improve Outcome: Not Progressing   Problem: Pain Management: Goal: Pain level will decrease Outcome: Not Progressing

## 2022-01-15 NOTE — Op Note (Signed)
Orthopaedic Surgery Operative Note (CSN: [id number])  Mallory Steele  January 30, 1932 Date of Surgery: 01/15/2022   Diagnoses:  Mallory intertrochanteric femur fracture  Procedure: Cephalomedullary nail for Mallory intertrochanteric femur fracture   Operative Finding Successful completion of the planned procedure.  Placement of a 125 degree x 11 mm x 360 mm cephalomedullary nail.  No complications.   Post-Op Diagnosis: Same Surgeons:Primary: Mordecai Rasmussen, MD Assistants: None Location: [facility] OR ROOM 3 Anesthesia: Sedation plus regional anesthesia Antibiotics: Ancef 2 g Tourniquet time: N/A Estimated Blood Loss: 865 cc Complications: None Specimens: None Implants: Implant Name Type Inv. Item Serial No. Manufacturer Lot No. LRB No. Used Action  NAIL IT  [id number] - SSTERILE ON SET Nail NAIL IT  [id number] STERILE ON SET [employer]  Mallory 1 Implanted  SCREW LAG GALILEO [id number] - [place] ON SET Screw SCREW LAG GALILEO 10.5X105 STERILE ON SET [place]  Mallory 1 Implanted  SCREW LOCK CORT 5X36 - SSTERILE ON SET Screw SCREW LOCK CORT 5X36 STERILE ON SET [place]  Mallory 1 Implanted    Indications for Surgery: Mallory Steele is a 88 y.o. female who had a mechanical fall and sustained a Mallory intertrochanteric femur fracture.  I recommended operative fixation to restore stability and allow the patient to ambulate immediately postop.  Benefits and risks of operative and nonoperative management were discussed prior to surgery with the patient's guardian and informed consent form was completed.  Specific risks including infection, need for additional surgery, persistent pain, bleeding, malunion, nonunion and more severe complications associated with anesthesia.  The patient's guardian elected to proceed and surgical consent was obtained.    Procedure:   The patient was identified properly. Informed consent was obtained and the surgical site was marked. The patient was taken to the OR where a  spinal epidural anesthesia with sedation was induced.   The patient was placed supine on a fracture table and appropriate reduction was obtained and visualized on fluoroscopy prior to the beginning of the procedure.  Timeout was performed before the beginning of the case.  Ancef 2 g dosing was confirmed prior to making incision.  The patient received TXA prior to the start of surgery.   We made an incision proximal to the greater trochanter and dissected down through the fascia.  We then carefully placed a guidepin, localizing under fluoroscopy.  Once satisfied with the starting point, the entry reamer was used to gain entry into the intramedullary canal.  A ball tip guidewire was then introduced and passed to an appropriate level at the physeal scar of the distal femur and measurement was obtained proximally using fluoroscopy.  We selected the appropriate length of nail, as noted above.  Entry reamer was used needed but the nail was unreamed.  At this point we placed our nail localizing under fluoroscopy, and confirmed that it was at the appropriate level.  Initial placement of the nail, displaced the fracture further.  At this point, the nail was removed.  We used the entry reamer to change our starting point the more medial.  Operative planning the needle was then placed over the guidewire once again.  We then placed a bone hook across the anterior aspect of the femur, and translated the distal femur laterally.  This improve the overall alignment. Next we used the outrigger device to pass a wire into the femoral neck, and then the cephalomedullary lag screw.  The screw was locked proximally to avoid over collapse.  We then turned our attention to the distal interlocking screw.  Once again, we used the outrigger device to place a single interlocking screw in the midshaft area.  The outrigger device was removed and final fluoroscopic images were obtained.  The wounds were thoroughly irrigated closed in a  multilayer fashion with 0 vicryl, 2-0 monocryl and staples monocryl.  Sterile dressings were placed.  The patient was awoken from general anesthesia and taken to the PACU in stable condition without complication.     Post-operative plan:  Weightbearing: The patient will be WBAT on the operative extremity.   DVT prophylaxis per primary team, no orthopedic contraindications.  Recommend 81 mg Aspirin BID, unless patient cannot tolerate or was previously on anticoagulation.  Prefer to start Ppx POD#1 Pain control with PRN pain medication preferring oral medicines.   Dressing can be reinforced as needed, will change on POD#2/3 if needed.  Patient does not need to remain hospitalized for dressing change Follow up plan: approximately 2 weeks postop for incision check and XR.  If the patient will be returning to a nursing facility, sutures can be trimmed around this time and a follow up appointment can be scheduled for 6 weeks after surgery. XR at next visit:  please obtain AP pelvis, and 2 views of the Mallory hip

## 2022-01-15 NOTE — Anesthesia Procedure Notes (Signed)
Spinal  Patient location during procedure: OR Start time: 01/15/2022 1:37 PM End time: 01/15/2022 1:47 PM Reason for block: surgical anesthesia Staffing Performed: anesthesiologist  Anesthesiologist: Denese Killings, MD Preanesthetic Checklist Completed: patient identified, IV checked, site marked, risks and benefits discussed, surgical consent, monitors and equipment checked, pre-op evaluation and timeout performed Spinal Block Patient position: right lateral decubitus Prep: Betadine, ChloraPrep and DuraPrep Patient monitoring: heart rate, cardiac monitor, continuous pulse ox and blood pressure Approach: midline Location: L3-4 Injection technique: single-shot Needle Needle type: Spinocan  Needle gauge: 22 G Needle length: 9 cm Needle insertion depth: 5 cm Assessment Sensory level: T4 Events: CSF return

## 2022-01-15 NOTE — Transfer of Care (Signed)
Immediate Anesthesia Transfer of Care Note  Patient: Mallory Steele  Procedure(s) Performed: INTRAMEDULLARY (IM) NAIL INTERTROCHANTRIC (Left: Hip)  Patient Location: PACU  Anesthesia Type:Spinal  Level of Consciousness: awake, alert  and patient cooperative  Airway & Oxygen Therapy: Patient Spontanous Breathing and Patient connected to face mask oxygen  Post-op Assessment: Report given to RN, Post -op Vital signs reviewed and stable and Patient moving all extremities X 4  Post vital signs: Reviewed and stable  Last Vitals:  Vitals Value Taken Time  BP 103/39 01/15/22 1547  Temp    Pulse 85 01/15/22 1552  Resp 21 01/15/22 1552  SpO2 96 % 01/15/22 1552  Vitals shown include unvalidated device data.  Last Pain:  Vitals:   01/15/22 0736  TempSrc:   PainSc: Asleep         Complications: No notable events documented.

## 2022-01-15 NOTE — Anesthesia Preprocedure Evaluation (Addendum)
Anesthesia Evaluation  Patient identified by MRN, date of birth, ID band Patient awake    Reviewed: Allergy & Precautions, NPO status , Patient's Chart, lab work & pertinent test results  History of Anesthesia Complications Negative for: history of anesthetic complications  Airway Mallampati: II  TM Distance: >3 FB Neck ROM: Full    Dental  (+) Edentulous Upper, Edentulous Lower   Pulmonary former smoker,    Pulmonary exam normal breath sounds clear to auscultation       Cardiovascular Exercise Tolerance: Good Normal cardiovascular exam Rhythm:Regular Rate:Normal     Neuro/Psych PSYCHIATRIC DISORDERS Anxiety Depression Dementia  Neuromuscular disease (RLS)    GI/Hepatic GERD  Medicated and Controlled,  Endo/Other    Renal/GU Renal InsufficiencyRenal disease     Musculoskeletal   Abdominal   Peds  Hematology  (+) Blood dyscrasia, anemia ,   Anesthesia Other Findings   Reproductive/Obstetrics                           Anesthesia Physical Anesthesia Plan  ASA: 3  Anesthesia Plan: General/Spinal   Post-op Pain Management: Dilaudid IV   Induction: Intravenous  PONV Risk Score and Plan: 4 or greater and Ondansetron and Dexamethasone  Airway Management Planned: Nasal Cannula, Natural Airway and Simple Face Mask  Additional Equipment:   Intra-op Plan:   Post-operative Plan:   Informed Consent: I have reviewed the patients History and Physical, chart, labs and discussed the procedure including the risks, benefits and alternatives for the proposed anesthesia with the patient or authorized representative who has indicated his/her understanding and acceptance.    Discussed DNR with power of attorney and Suspend DNR.   Consent reviewed with POA  Plan Discussed with: CRNA and Surgeon  Anesthesia Plan Comments: (Possible GA with airway was discussed.)     Anesthesia Quick  Evaluation

## 2022-01-15 NOTE — Progress Notes (Signed)
Patient has been NPO since midnight per night shift nurse, Will remain NPO until surgery. No order noted for NPO status at this time.

## 2022-01-15 NOTE — Anesthesia Postprocedure Evaluation (Signed)
Anesthesia Post Note  Patient: Mallory Steele  Procedure(s) Performed: INTRAMEDULLARY (IM) NAIL INTERTROCHANTRIC (Left: Hip)  Patient location during evaluation: PACU Anesthesia Type: Combined General/Spinal Level of consciousness: awake and sedated Pain management: pain level controlled Vital Signs Assessment: post-procedure vital signs reviewed and stable Respiratory status: spontaneous breathing, nonlabored ventilation, respiratory function stable and patient connected to nasal cannula oxygen Cardiovascular status: blood pressure returned to baseline and stable Postop Assessment: no apparent nausea or vomiting and spinal receding Anesthetic complications: no   No notable events documented.   Last Vitals:  Vitals:   01/15/22 1622 01/15/22 1630  BP: 117/65 (!) 120/35  Pulse: 86 84  Resp: 18 16  Temp: 36.9 C   SpO2: 100% 100%    Last Pain:  Vitals:   01/15/22 1630  TempSrc:   PainSc: 0-No pain                 Wesleigh Markovic C Vilma Will

## 2022-01-15 NOTE — Progress Notes (Signed)
Pt yelling out that she is in pain on shift report. Pt stated that she is in the worst pain on her left leg that she had surgury today on administered prn HYDROmorphone (DILAUDID) injection 0.5 mg    Will continue to monitor. Pt is alert and able to make needs known. Placed call bell in reach and spoke with pt on breathing to calm.

## 2022-01-15 NOTE — Progress Notes (Signed)
PROGRESS NOTE     Mallory Steele, is a 88 y.o. female, DOB - Sep 05, 1932, YQI:[id number]  Admit date - 01/14/2022   Admitting Physician Mallory Hoit, DO  Outpatient Primary MD for the patient is Steele, Mallory Kaufmann, MD  LOS - 1  Chief Complaint  Patient presents with   Fall        Brief Narrative:   88 y.o. female with medical history significant of dementia, GERD, anxiety, RLS admitted on 01/14/22--with displaced comminuted intertrochanteric fracture of the left femur -Awaiting orthopedic/operative intervention    -Assessment and Plan:  1)Closed left femoral fracture ([place])-  -Orthopedic Left femoral x-ray showed displaced comminuted intertrochanteric fracture of the left femoral. -Orthopedic  surgery (Mallory Steele) consult appreciated  -displaced comminuted intertrochanteric fracture of the left femur -Awaiting orthopedic/operative intervention on 01/15/2022 continue IV Dilaudid and as needed Percocet as needed for pain control  -continue fall precaution and neurochecks Consider PT/OT eval after surgery  Chronic kidney disease, stage 3b ([place]) BUN/creatinine was 15/1.31 (baseline creatinine at 1.2-1.3). Continue gentle hydration -Creatinine currently stable around 1.3 Renally adjust medications, avoid nephrotoxic agents/dehydration/hypotension  Hyperglycemia CBG 147, this is possibly reactive -Check A1c Continue to monitor CBG   Fall- (present on admission) Continue fall precaution and neurochecks  PT/OT eval after surgery  -patient is currently on bedrest  GERD without esophagitis/H/o Prior Gi Surgery with "Dumping Syndrome"-  - (present on admission) -Stable, continue Protonix -May need nutritional supplements  Dementia--- at baseline patient has mild to moderate cognitive and memory deficits -Please be advised that the above patient has a primary diagnosis of dementia which supersedes any other psychiatric diagnosis.   -PTA she was on Cymbalta, trazodone,  risperidone and as needed lorazepam  RLS/Rheumatoid Arthritis--currently no longer Requip   Social/Ethics--Has been on Hospice for > 6 months, she is a DNR  Disposition/Need for in-Hospital Stay- patient unable to be discharged at this time due to --- anticipate discharge to SNF rehab after operative fixation of her Lt hip fracture  Status is: Inpatient   Disposition: The patient is from: Home              Anticipated d/c is to: SNF              Anticipated d/c date is: 2 days              Patient currently is not medically stable to d/c. Barriers: Not Clinically Stable-   Code Status :  -  Code Status: DNR   Family Communication:    (patient is alert, awake and coherent) -Daughter Mallory Steele--- [phone]  DVT Prophylaxis  :   - SCDs  SCDs Start: 01/14/22 2122   Lab Results  Component Value Date   PLT 163 01/15/2022    Inpatient Medications  Scheduled Meds:  magnesium oxide  400 mg Oral BID   pantoprazole  40 mg Oral Daily   Continuous Infusions:   ceFAZolin (ANCEF) IV     lactated ringers 75 mL/hr at 01/15/22 0007   tranexamic acid     PRN Meds:.HYDROmorphone (DILAUDID) injection   Anti-infectives (From admission, onward)    Start     Dose/Rate Route Frequency Ordered Stop   01/15/22 0600  ceFAZolin (ANCEF) IVPB 2g/100 mL premix        2 g 200 mL/hr over 30 Minutes Intravenous On call to O.R. 01/14/22 2226 01/16/22 0559         Subjective: Mallory Steele today has no fevers, no emesis,  No  chest pain,  - Resting comfortably, cooperative -Intermittent confusion  Objective: Vitals:   01/14/22 2325 01/15/22 0000 01/15/22 0032 01/15/22 0411  BP: (!) 156/66 (!) 146/69  132/60  Pulse: 85 80  92  Resp: 18 (!) 24  (!) 23  Temp: 97.6 F (36.4 C) 97.8 F (36.6 C)  98.8 F (37.1 C)  TempSrc: Oral     SpO2: 98% 100%  100%  Weight:   68.7 kg   Height:   5\' 4"  (1.626 m)     Intake/Output Summary (Last 24 hours) at 01/15/2022 1133 Last data filed at  01/15/2022 0900 Gross per 24 hour  Intake 670 ml  Output --  Net 670 ml   Filed Weights   01/15/22 0032  Weight: 68.7 kg    Physical Exam  Gen:- Awake Alert, pleasantly confused, cooperative HEENT:- [place].AT, No sclera icterus Neck-Supple Neck,No JVD,.  Lungs-  CTAB , fair symmetrical air movement CV- S1, S2 normal, regular  Abd-  +ve B.Sounds, Abd Soft, No tenderness,    Extremity/Skin:- No  edema, pedal pulses present  Psych-underlying/baseline memory and cognitive deficits Neuro-generalized weakness, no new focal deficits, no tremors MSK-left lower extremity is rotated and shortened, pedal pulses okay, -  Data Reviewed: I have personally reviewed following labs and imaging studies  CBC: Recent Labs  Lab 01/14/22 1738 01/15/22 0442  WBC 8.7 8.9  NEUTROABS 6.6  --   HGB 12.4 10.2*  HCT 38.9 31.9*  MCV 97.5 95.5  PLT 182 573   Basic Metabolic Panel: Recent Labs  Lab 01/14/22 1738 01/15/22 0442  NA 137 136  K 4.5 4.7  CL 106 103  CO2 21* 23  GLUCOSE 147* 146*  BUN 15 20  CREATININE 1.31* 1.28*  CALCIUM 8.5* 8.5*  MG  --  1.7  PHOS  --  3.5   GFR: Estimated Creatinine Clearance: 29.5 mL/min (A) (by C-G formula based on SCr of 1.28 mg/dL (H)). Liver Function Tests: Recent Labs  Lab 01/15/22 0442  AST 23  ALT 13  ALKPHOS 123  BILITOT 0.9  PROT 6.6  ALBUMIN 3.7   Cardiac Enzymes: No results for input(s): CKTOTAL, CKMB, CKMBINDEX, TROPONINI in the last 168 hours. BNP (last 3 results) No results for input(s): PROBNP in the last 8760 hours. HbA1C: No results for input(s): HGBA1C in the last 72 hours. Sepsis Labs: @LABRCNTIP (procalcitonin:4,lacticidven:4) ) Recent Results (from the past 240 hour(s))  Resp Panel by RT-PCR (Flu A&B, Covid) Nasopharyngeal Swab     Status: None   Collection Time: 01/14/22  7:26 PM   Specimen: Nasopharyngeal Swab; Nasopharyngeal(NP) swabs in vial transport medium  Result Value Ref Range Status   SARS Coronavirus 2 by RT  PCR NEGATIVE NEGATIVE Final    Comment: (NOTE) SARS-CoV-2 target nucleic acids are NOT DETECTED.  The SARS-CoV-2 RNA is generally detectable in upper respiratory specimens during the acute phase of infection. The lowest concentration of SARS-CoV-2 viral copies this assay can detect is 138 copies/mL. A negative result does not preclude SARS-Cov-2 infection and should not be used as the sole basis for treatment or other patient management decisions. A negative result may occur with  improper specimen collection/handling, submission of specimen other than nasopharyngeal swab, presence of viral mutation(s) within the areas targeted by this assay, and inadequate number of viral copies(<138 copies/mL). A negative result must be combined with clinical observations, patient history, and epidemiological information. The expected result is Negative.  Fact Sheet for Patients:  EntrepreneurPulse.com.au  Fact Sheet for Healthcare  Providers:  IncredibleEmployment.be  This test is no t yet approved or cleared by the [place] and  has been authorized for detection and/or diagnosis of SARS-CoV-2 by FDA under an Emergency Use Authorization (EUA). This EUA will remain  in effect (meaning this test can be used) for the duration of the COVID-19 declaration under Section 564(b)(1) of the Act, 21 U.S.C.section 360bbb-3(b)(1), unless the authorization is terminated  or revoked sooner.       Influenza A by PCR NEGATIVE NEGATIVE Final   Influenza B by PCR NEGATIVE NEGATIVE Final    Comment: (NOTE) The Xpert Xpress SARS-CoV-2/FLU/RSV plus assay is intended as an aid in the diagnosis of influenza from Nasopharyngeal swab specimens and should not be used as a sole basis for treatment. Nasal washings and aspirates are unacceptable for Xpert Xpress SARS-CoV-2/FLU/RSV testing.  Fact Sheet for Patients: EntrepreneurPulse.com.au  Fact Sheet for  Healthcare Providers: IncredibleEmployment.be  This test is not yet approved or cleared by the [place] FDA and has been authorized for detection and/or diagnosis of SARS-CoV-2 by FDA under an Emergency Use Authorization (EUA). This EUA will remain in effect (meaning this test can be used) for the duration of the COVID-19 declaration under Section 564(b)(1) of the Act, 21 U.S.C. section 360bbb-3(b)(1), unless the authorization is terminated or revoked.  Performed at [facility], [address] [place], [place] [address]   Surgical pcr screen     Status: None   Collection Time: 01/15/22 12:52 AM   Specimen: Nasal Mucosa; Nasal Swab  Result Value Ref Range Status   MRSA, PCR NEGATIVE NEGATIVE Final   Staphylococcus aureus NEGATIVE NEGATIVE Final    Comment: (NOTE) The Xpert SA Assay (FDA approved for NASAL specimens in patients 43 years of age and older), is one component of a comprehensive surveillance program. It is not intended to diagnose infection nor to guide or monitor treatment. Performed at [facility], [address], [place], [place] [address]       Radiology Studies: DG Pelvis 1-2 Views  Result Date: 01/14/2022 CLINICAL DATA:  Unwitnessed fall. EXAM: PELVIS - 1-2 VIEW COMPARISON:  None. FINDINGS: Redemonstration of the left intertrochanteric fracture with superior displacement of the distal femur. No other appreciable fracture or dislocation. Evaluation of sacrum is limited due to overlying bowel contents. Advanced degenerate disc disease of the lower lumbar spine. IMPRESSION: Left femoral intertrochanteric fracture. Advanced degenerate disc disease of the lumbar spine. Electronically Signed   By: Keane Police D.O.   On: 01/14/2022 17:41   CT Head Wo Contrast  Result Date: 01/14/2022 CLINICAL DATA:  Fall on back.  Unwitnessed fall. EXAM: CT HEAD WITHOUT CONTRAST CT CERVICAL SPINE WITHOUT CONTRAST TECHNIQUE: Multidetector CT imaging of the head and  cervical spine was performed following the standard protocol without intravenous contrast. Multiplanar CT image reconstructions of the cervical spine were also generated. RADIATION DOSE REDUCTION: This exam was performed according to the departmental dose-optimization program which includes automated exposure control, adjustment of the mA and/or kV according to patient size and/or use of iterative reconstruction technique. COMPARISON:  CT head dated December 27, 2021. FINDINGS: CT HEAD FINDINGS Brain: No evidence of acute infarction, hemorrhage, hydrocephalus, extra-axial collection or mass lesion/mass effect. Moderate microvascular ischemic changes of the white matter and mild cerebral volume loss, unchanged. Vascular: No hyperdense vessel or unexpected calcification. Skull: Normal. Negative for fracture or focal lesion. Sinuses/Orbits: No acute finding.  Right cataract surgery. Other: None. CT CERVICAL SPINE FINDINGS Alignment: Anterolisthesis of C3 and  C7.  Mild retrolisthesis of C4. Skull base and vertebrae: No acute fracture. No primary bone lesion or focal pathologic process. Soft tissues and spinal canal: No prevertebral fluid or swelling. No visible canal hematoma. Disc levels: Advanced multilevel degenerate disc disease with near complete loss of disc spaces at C4-C5, C5-C6 and C6-C7. Prominent osteophytes. Multilevel uncovertebral joint arthropathy. C2-C3:  No significant findings. C3-C4: Disc osteophyte complex with mild left neural foraminal narrowing. C4-C5: Near complete loss of the disc space with prominent anterior and posterior osteophytes. Focal central canal narrowing. Mild left neural foraminal narrowing. C5-C6: Uncovertebral joint hypertrophy with mild bilateral neural foraminal narrowing. C6-C7: Disc osteophyte complex with mild right neural foraminal narrowing. C7-T1:  No significant finding. Upper chest: Negative. Other: None IMPRESSION: 1.  No acute intracranial abnormality. 2. Mild cerebral  volume loss and moderate chronic microvascular ischemic changes of the white matter, unchanged. 3.  No evidence of acute fracture. 4. Advanced multilevel degenerate disc disease most prominent at C4-C5, C5-C6 and C6-C7. Electronically Signed   By: Keane Police D.O.   On: 01/14/2022 17:55   CT Cervical Spine Wo Contrast  Result Date: 01/14/2022 CLINICAL DATA:  Fall on back.  Unwitnessed fall. EXAM: CT HEAD WITHOUT CONTRAST CT CERVICAL SPINE WITHOUT CONTRAST TECHNIQUE: Multidetector CT imaging of the head and cervical spine was performed following the standard protocol without intravenous contrast. Multiplanar CT image reconstructions of the cervical spine were also generated. RADIATION DOSE REDUCTION: This exam was performed according to the departmental dose-optimization program which includes automated exposure control, adjustment of the mA and/or kV according to patient size and/or use of iterative reconstruction technique. COMPARISON:  CT head dated December 27, 2021. FINDINGS: CT HEAD FINDINGS Brain: No evidence of acute infarction, hemorrhage, hydrocephalus, extra-axial collection or mass lesion/mass effect. Moderate microvascular ischemic changes of the white matter and mild cerebral volume loss, unchanged. Vascular: No hyperdense vessel or unexpected calcification. Skull: Normal. Negative for fracture or focal lesion. Sinuses/Orbits: No acute finding.  Right cataract surgery. Other: None. CT CERVICAL SPINE FINDINGS Alignment: Anterolisthesis of C3 and C7.  Mild retrolisthesis of C4. Skull base and vertebrae: No acute fracture. No primary bone lesion or focal pathologic process. Soft tissues and spinal canal: No prevertebral fluid or swelling. No visible canal hematoma. Disc levels: Advanced multilevel degenerate disc disease with near complete loss of disc spaces at C4-C5, C5-C6 and C6-C7. Prominent osteophytes. Multilevel uncovertebral joint arthropathy. C2-C3:  No significant findings. C3-C4: Disc  osteophyte complex with mild left neural foraminal narrowing. C4-C5: Near complete loss of the disc space with prominent anterior and posterior osteophytes. Focal central canal narrowing. Mild left neural foraminal narrowing. C5-C6: Uncovertebral joint hypertrophy with mild bilateral neural foraminal narrowing. C6-C7: Disc osteophyte complex with mild right neural foraminal narrowing. C7-T1:  No significant finding. Upper chest: Negative. Other: None IMPRESSION: 1.  No acute intracranial abnormality. 2. Mild cerebral volume loss and moderate chronic microvascular ischemic changes of the white matter, unchanged. 3.  No evidence of acute fracture. 4. Advanced multilevel degenerate disc disease most prominent at C4-C5, C5-C6 and C6-C7. Electronically Signed   By: Keane Police D.O.   On: 01/14/2022 17:55   CT Thoracic Spine Wo Contrast  Result Date: 01/14/2022 CLINICAL DATA:  [place].  Back pain.  Left hip fracture. EXAM: CT THORACIC AND LUMBAR SPINE WITHOUT CONTRAST TECHNIQUE: Multidetector CT imaging of the thoracic and lumbar spine was performed without contrast. Multiplanar CT image reconstructions were also generated. RADIATION DOSE REDUCTION: This exam was performed according to  the departmental dose-optimization program which includes automated exposure control, adjustment of the mA and/or kV according to patient size and/or use of iterative reconstruction technique. COMPARISON:  None. FINDINGS: CT THORACIC SPINE FINDINGS Alignment: Normal Vertebrae: Advanced degenerative changes and osteoporosis no acute fracture. The facets are normally aligned. No facet or laminar fractures. The posterior ribs are intact. Paraspinal and other soft tissues: No significant paraspinal findings. No lung lesions or mediastinal hematoma. Disc levels: The spinal canal is fairly generous. No canal stenosis. CT LUMBAR SPINE FINDINGS Segmentation: There are five lumbar type vertebral bodies. The last full intervertebral disc space is  labeled L5-S1. Alignment: Severe degenerative lumbar spondylosis and scoliosis. Vertebrae: No acute fracture is identified. Extensive discogenic sclerosis. Paraspinal and other soft tissues: No significant paraspinal findings. Disc levels: Severe degenerative lumbar spondylosis with multilevel disc disease and facet disease. Mild multilevel lateral recess and foraminal stenosis. No significant canal stenosis. IMPRESSION: 1. Advanced degenerative changes and osteoporosis but no acute fracture of the thoracic or lumbar spine. 2. Severe degenerative lumbar spondylosis and scoliosis with multilevel disc disease and facet disease. 3. No significant canal stenosis. Electronically Signed   By: Marijo Sanes M.D.   On: 01/14/2022 17:52   CT Lumbar Spine Wo Contrast  Result Date: 01/14/2022 CLINICAL DATA:  [place].  Back pain.  Left hip fracture. EXAM: CT THORACIC AND LUMBAR SPINE WITHOUT CONTRAST TECHNIQUE: Multidetector CT imaging of the thoracic and lumbar spine was performed without contrast. Multiplanar CT image reconstructions were also generated. RADIATION DOSE REDUCTION: This exam was performed according to the departmental dose-optimization program which includes automated exposure control, adjustment of the mA and/or kV according to patient size and/or use of iterative reconstruction technique. COMPARISON:  None. FINDINGS: CT THORACIC SPINE FINDINGS Alignment: Normal Vertebrae: Advanced degenerative changes and osteoporosis no acute fracture. The facets are normally aligned. No facet or laminar fractures. The posterior ribs are intact. Paraspinal and other soft tissues: No significant paraspinal findings. No lung lesions or mediastinal hematoma. Disc levels: The spinal canal is fairly generous. No canal stenosis. CT LUMBAR SPINE FINDINGS Segmentation: There are five lumbar type vertebral bodies. The last full intervertebral disc space is labeled L5-S1. Alignment: Severe degenerative lumbar spondylosis and  scoliosis. Vertebrae: No acute fracture is identified. Extensive discogenic sclerosis. Paraspinal and other soft tissues: No significant paraspinal findings. Disc levels: Severe degenerative lumbar spondylosis with multilevel disc disease and facet disease. Mild multilevel lateral recess and foraminal stenosis. No significant canal stenosis. IMPRESSION: 1. Advanced degenerative changes and osteoporosis but no acute fracture of the thoracic or lumbar spine. 2. Severe degenerative lumbar spondylosis and scoliosis with multilevel disc disease and facet disease. 3. No significant canal stenosis. Electronically Signed   By: Marijo Sanes M.D.   On: 01/14/2022 17:52   DG Femur Min 2 Views Left  Result Date: 01/14/2022 CLINICAL DATA:  Fall, deformity EXAM: LEFT FEMUR 2 VIEWS COMPARISON:  None. FINDINGS: There is a displaced comminuted intertrochanteric fracture of the left femur. Distal femur is intact. Left knee arthroplasty. IMPRESSION: Displaced comminuted intertrochanteric fracture of the left femur. Electronically Signed   By: Keane Police D.O.   On: 01/14/2022 17:39     Scheduled Meds:  magnesium oxide  400 mg Oral BID   pantoprazole  40 mg Oral Daily   Continuous Infusions:   ceFAZolin (ANCEF) IV     lactated ringers 75 mL/hr at 01/15/22 0007   tranexamic acid       LOS: 1 day    Danijah Noh M.D  on 01/15/2022 at 11:33 AM  Go to [email or web address] - for contact info  [facility] - Office  [phone]  If 7PM-7AM, please contact night-coverage [email or web address] Password [facility] 01/15/2022, 11:33 AM

## 2022-01-15 NOTE — Progress Notes (Signed)
Transition of Care (TOC) -30 day Note       Patient Details  Name: Mallory Steele MRN: [id number] Date of Birth: July 12, 1932   Transition of Care [facility]) CM/SW Contact  Name: Shade Flood Phone Number: [phone] Date: 01/15/2022 Time: 1121     To Whom it May Concern:   Please be advised that the above patient has a primary diagnosis of dementia which supersedes any psychiatric diagnosis.

## 2022-01-15 NOTE — NC FL2 (Signed)
[place] MEDICAID FL2 LEVEL OF CARE SCREENING TOOL     IDENTIFICATION  Patient Name: Mallory Steele Birthdate: [birth year] Sex: female Admission Date (Current Location): 01/14/2022  [facility] and [place] Number:  [employer] and Address:  [place] [address], [place]      Provider Number: [id number]  Attending Physician Name and Address:  Roxan Hockey, MD  Relative Name and Phone Number:       Current Level of Care: Hospital Recommended Level of Care: [place] Prior Approval Number:    Date Approved/Denied:   PASRR Number:    Discharge Plan: SNF    Current Diagnoses: Patient Active Problem List   Diagnosis Date Noted   Closed left femoral fracture ([place]) 01/14/2022   Hyperglycemia 01/14/2022   Chronic kidney disease, stage 3b ([place]) 01/14/2022   RLS (restless legs syndrome) 01/14/2022   Fall 07/14/2021   GERD without esophagitis 09/10/2019   Psoriasis 06/01/2018   Cholecystitis, chronic 11/23/2017   Closed nondisplaced fracture of head of left radius 11/02/2016   Closed fracture of right proximal humerus 10/12/2016   Dementia ([place]) 10/15/2015   Insomnia 10/15/2015   Depression 10/15/2015   Anxiety 01/01/2014   Iron deficiency anemia 01/01/2014   Long term use of drug 01/01/2014    Orientation RESPIRATION BLADDER Height & Weight     Self  Normal Incontinent Weight: 151 lb 7.3 oz (68.7 kg) Height:  5\' 4"  (162.6 cm)  BEHAVIORAL SYMPTOMS/MOOD NEUROLOGICAL BOWEL NUTRITION STATUS      Continent Diet (see dc summary)  AMBULATORY STATUS COMMUNICATION OF NEEDS Skin   Extensive Assist Verbally Surgical wounds                       Personal Care Assistance Level of Assistance  Bathing, Feeding, Dressing Bathing Assistance: Limited assistance Feeding assistance: Limited assistance Dressing Assistance: Limited assistance     Functional Limitations Info  Sight, Hearing, Speech Sight Info:  Adequate Hearing Info: Adequate Speech Info: Adequate    SPECIAL CARE FACTORS FREQUENCY  PT (By licensed PT), OT (By licensed OT)     PT Frequency: 5x week OT Frequency: 3x week            Contractures Contractures Info: Not present    Additional Factors Info  Code Status, Allergies, Psychotropic Code Status Info: DNR Allergies Info: NKA Psychotropic Info: Cymbalta, Ativan, Risperdal         Current Medications (01/15/2022):  This is the current hospital active medication list Current Facility-Administered Medications  Medication Dose Route Frequency Provider Last Rate Last Admin   ceFAZolin (ANCEF) IVPB 2g/100 mL premix  2 g Intravenous On Call to OR Mordecai Rasmussen, MD       HYDROmorphone (DILAUDID) injection 0.5 mg  0.5 mg Intravenous Q2H PRN Adefeso, Oladapo, DO   0.5 mg at 01/15/22 0706   lactated ringers infusion   Intravenous Continuous Adefeso, Oladapo, DO 75 mL/hr at 01/15/22 0007 Restarted at 01/15/22 0007   magnesium oxide (MAG-OX) tablet 400 mg  400 mg Oral BID Roxan Hockey, MD   400 mg at 01/15/22 0855   pantoprazole (PROTONIX) EC tablet 40 mg  40 mg Oral Daily Adefeso, Oladapo, DO   40 mg at 01/14/22 2245   tranexamic acid (CYKLOKAPRON) IVPB 1,000 mg  1,000 mg Intravenous To OR Mordecai Rasmussen, MD         Discharge Medications: Please see discharge summary for a list of  discharge medications.  Relevant Imaging Results:  Relevant Lab Results:   Additional Information SSN: [id number],

## 2022-01-15 NOTE — TOC Initial Note (Addendum)
Transition of Care [facility]) - Initial/Assessment Note    Patient Details  Name: Mallory Steele MRN: [id number] Date of Birth: [birth year]  Transition of Care [facility]) CM/SW Contact:    Shade Flood, LCSW Phone Number: 01/15/2022, 11:12 AM  Clinical Narrative:                  Pt admitted from home with hip fracture. MD states pt will need SNF rehab at dc. Surgery will be today. Pt will likely have PT eval over the weekend. Spoke with pt's daughter to assess and review dc planning. Per daughter, she and pt live together. Pt has been on hospice care at home with [facility]. Daughter agrees that pt will need SNF rehab at dc as she cannot provide enough physical assistance to pt at home. Daughter does normally assist pt with her ADLs. Daughter aware that if pt goes to rehab she will have to revoke hospice services and then re-enroll upon SNF dc.  Reviewed CMS SNF provider options and will refer as requested. Will initiate insurance authorization once PT eval complete.   TOC will follow.  1539: PASRR review has elevated pt's screen to a Level II screen. Pt cannot dc until screen complete and PASRR number assigned. Also, pt's insurance is not managed by [employer] so accepting SNF will have to initiate authorization.   Expected Discharge Plan: Skilled Nursing Facility Barriers to Discharge: Continued Medical Work up   Patient Goals and CMS Choice Patient states their goals for this hospitalization and ongoing recovery are:: rehab CMS Medicare.gov Compare Post Acute Care list provided to:: Patient Represenative (must comment) Choice offered to / list presented to : Adult Children  Expected Discharge Plan and Services Expected Discharge Plan: [place] In-house Referral: Clinical Social Work   Post Acute Care Choice: [place] Living arrangements for the past 2 months: [place]                                      Prior Living  Arrangements/Services Living arrangements for the past 2 months: Single Family Home Lives with:: Adult Children Patient language and need for interpreter reviewed:: Yes Do you feel safe going back to the place where you live?: Yes      Need for Family Participation in Patient Care: Yes (Comment) Care giver support system in place?: Yes (comment) Current home services: DME, Hospice Criminal Activity/Legal Involvement Pertinent to Current Situation/Hospitalization: No - Comment as needed  Activities of Daily Living Home Assistive Devices/Equipment: [place] Rile (specify type) ADL Screening (condition at time of admission) Patient's cognitive ability adequate to safely complete daily activities?: No Is the patient deaf or have difficulty hearing?: No Does the patient have difficulty seeing, even when wearing glasses/contacts?: No Does the patient have difficulty concentrating, remembering, or making decisions?: Yes Patient able to express need for assistance with ADLs?: Yes Does the patient have difficulty dressing or bathing?: No Independently performs ADLs?: Yes (appropriate for developmental age) Does the patient have difficulty walking or climbing stairs?: No Weakness of Legs: Left Weakness of Arms/Hands: None  Permission Sought/Granted Permission sought to share information with : Facility Art therapist granted to share information with : Yes, Verbal Permission Granted     Permission granted to share info w AGENCY: [facility]        Emotional Assessment Appearance:: Appears stated age     Orientation: :  Oriented to Self Alcohol / Substance Use: Not Applicable Psych Involvement: No (comment)  Admission diagnosis:  Fall [W19.XXXA] Closed left femoral fracture ([place]) [S72.92XA] Closed displaced intertrochanteric fracture of left femur, initial encounter ([place]) [S72.142A] Patient Active Problem List   Diagnosis Date Noted   Closed left femoral fracture ([place])  01/14/2022   Hyperglycemia 01/14/2022   Chronic kidney disease, stage 3b ([place]) 01/14/2022   RLS (restless legs syndrome) 01/14/2022   Fall 07/14/2021   GERD without esophagitis 09/10/2019   Psoriasis 06/01/2018   Cholecystitis, chronic 11/23/2017   Closed nondisplaced fracture of head of left radius 11/02/2016   Closed fracture of right proximal humerus 10/12/2016   Dementia ([place]) 10/15/2015   Insomnia 10/15/2015   Depression 10/15/2015   Anxiety 01/01/2014   Iron deficiency anemia 01/01/2014   Long term use of drug 01/01/2014   PCP:  Dettinger, Fransisca Kaufmann, MD Pharmacy:   [id number] - [place], [address] [place] [address] Phone: [phone] Fax: [phone]     Social Determinants of Health (SDOH) Interventions    Readmission Risk Interventions No flowsheet data found.

## 2022-01-15 NOTE — Consult Note (Signed)
ORTHOPAEDIC CONSULTATION  REQUESTING PHYSICIAN: Roxan Hockey, MD  ASSESSMENT AND PLAN: 88 y.o. female with the following: Left Hip Intertrochanteric femur fracture  This patient requires inpatient admission to the hospitalist, to include preoperative clearance and perioperative medical management  - Weight Bearing Status/Activity: NWB Left lower extremity  - Additional recommended labs/tests: Preop Labs: CBC, BMP, PT/INR, Chest XR, and EKG  -VTE Prophylaxis: Please hold prior to OR; to resume POD#1 at the discretion of the primary team  - Pain control: Recommend PO pain medications PRN; judicious use of narcotics  - Follow-up plan: F/u 10-14 days postop  -Procedures: Plan for OR once patient has been medically optimized  Plan for Left hip Cephalomedullary nail   Plan to proceed to surgery on 01/15/2022 pending availability in the operating room.  Please keep the patient n.p.o. until the scheduled surgery.  Surgery has been discussed in great detail with the patient's daughter, who is also the healthcare power of attorney.  All questions were answered.    Chief Complaint: Left hip pain  HPI: Mallory Steele is a 88 y.o. female who presented to the ED for evaluation after sustaining a mechanical fall.  Patient is demented, and no family is available at the bedside.  All information has been gleaned from the chart, and in discussions with the daughter via telephone.  Patient ambulates freely within the home.  She is supposed to use a walker, but rarely does.  She sustained a fall at home.  She is complaining of left hip pain.  No other injuries noted at this time.  Pain is worse with movement.  She is comfortable when she is not moving.  Past Medical History:  Diagnosis Date   Anxiety    Dementia [facility])    Past Surgical History:  Procedure Laterality Date   APPENDECTOMY  1947   COLON SURGERY  [phone]   JOINT REPLACEMENT     knee replaced   Social History    Socioeconomic History   Marital status: Widowed    Spouse name: Not on file   Number of children: Not on file   Years of education: Not on file   Highest education level: Not on file  Occupational History   Not on file  Tobacco Use   Smoking status: Former    Packs/day: 0.25    Years: 3.00    Pack years: 0.75    Types: Cigarettes    Quit date: 09/22/1985    Years since quitting: 36.3   Smokeless tobacco: Never  Vaping Use   Vaping Use: Never used  Substance and Sexual Activity   Alcohol use: Yes    Alcohol/week: 1.0 standard drink    Types: 1 Glasses of wine per week   Drug use: No   Sexual activity: Never  Other Topics Concern   Not on file  Social History Narrative   Not on file   Social Determinants of Health   Financial Resource Strain: Not on file  Food Insecurity: Not on file  Transportation Needs: Not on file  Physical Activity: Inactive   Days of Exercise per Week: 0 days   Minutes of Exercise per Session: 0 min  Stress: Not on file  Social Connections: Not on file   Family History  Problem Relation Age of Onset   Arthritis Mother    Cancer Mother    Arthritis Father    Asthma Father    COPD Father    Arthritis Sister    Birth defects  Sister    Diabetes Sister    Hearing loss Sister    Mental retardation Sister    Arthritis Brother    Early death Brother    Heart disease Brother    Hypertension Brother    No Known Allergies Prior to Admission medications   Medication Sig Start Date End Date Taking? Authorizing Provider  clobetasol cream (TEMOVATE) 0.26 % Apply 1 application topically 2 (two) times daily. 06/01/18  Yes Hawks, Christy A, FNP  diphenhydramine-acetaminophen (TYLENOL PM) 25-500 MG TABS tablet Take 1 tablet by mouth at bedtime as needed.   Yes [provider]  DULoxetine (CYMBALTA) 60 MG capsule Take 2 capsules (120 mg total) by mouth daily. (NEEDS TO BE SEEN BEFORE NEXT REFILL) Patient taking differently: Take 60 mg by  mouth daily. 02/02/21  Yes Dettinger, Fransisca Kaufmann, MD  ferrous sulfate 325 (65 FE) MG tablet TAKE 1 TABLET BY MOUTH EVERY DAY WITH BREAKFAST Patient taking differently: Take 325 mg by mouth daily with breakfast. 04/06/21  Yes Dettinger, Fransisca Kaufmann, MD  HYDROcodone-acetaminophen (NORCO) 10-325 MG tablet Take 1 tablet by mouth every 4 (four) hours as needed for moderate pain. 08/28/21  Yes [provider]  Incontinence Supply Disposable (DEPEND UNDERWEAR LARGE) MISC 1 each by Does not apply route 4 (four) times daily as needed. 01/14/20  Yes Hawks, Christy A, FNP  LORazepam (ATIVAN) 1 MG tablet Take 1 mg by mouth See admin instructions. Take 1/2 to 2 tablets by mouth every 2 hours as needed  agitation. 07/22/21  Yes [provider]  meclizine (ANTIVERT) 25 MG tablet TAKE 1 TABLET (25 MG TOTAL) BY MOUTH 3 (THREE) TIMES DAILY AS NEEDED FOR DIZZINESS. Patient taking differently: Take 25 mg by mouth daily as needed for dizziness. 06/29/18  Yes Dettinger, Fransisca Kaufmann, MD  MUCINEX FAST-MAX DM MAX 20-400 MG/20ML LIQD Take 5 mLs by mouth every 4 (four) hours as needed (mucas releif). 01/13/22  Yes [provider]  omeprazole (PRILOSEC) 20 MG capsule Take 1 capsule (20 mg total) by mouth daily. (NEEDS TO BE SEEN BEFORE NEXT REFILL) 02/02/21  Yes Dettinger, Fransisca Kaufmann, MD  traZODone (DESYREL) 50 MG tablet TAKE 1 TO 2 TABLETS BY MOUTH AT BEDTIME AS NEEDED FOR SLEEP Patient taking differently: Take 50 mg by mouth at bedtime. 06/22/21  Yes Dettinger, Fransisca Kaufmann, MD  vitamin B-12 (CYANOCOBALAMIN) 250 MCG tablet TAKE 1 TABLET BY MOUTH EVERY DAY 04/17/19  Yes Dettinger, Fransisca Kaufmann, MD  donepezil (ARICEPT) 10 MG tablet TAKE 1 TABLET BY MOUTH EVERY DAY Patient not taking: Reported on 01/14/2022 04/06/21   Dettinger, Fransisca Kaufmann, MD  LORazepam (ATIVAN) 0.5 MG tablet Take 1 tablet (0.5 mg total) by mouth 2 (two) times daily as needed for anxiety. Patient not taking: Reported on 01/14/2022 05/07/21   Dettinger, Fransisca Kaufmann, MD   risperiDONE (RISPERDAL) 2 MG tablet Take 1 tablet (2 mg total) by mouth 2 (two) times daily. Patient not taking: Reported on 01/14/2022 03/04/21   Dettinger, Fransisca Kaufmann, MD  rOPINIRole (REQUIP) 0.25 MG tablet Take 1 tablet (0.25 mg total) by mouth 3 (three) times daily. Patient not taking: Reported on 01/14/2022 04/06/21   Dettinger, Fransisca Kaufmann, MD  zinc gluconate 50 MG tablet Take 1 tablet (50 mg total) by mouth daily. Patient not taking: Reported on 01/14/2022 05/23/20   Dettinger, Fransisca Kaufmann, MD   DG Pelvis 1-2 Views  Result Date: 01/14/2022 CLINICAL DATA:  Unwitnessed fall. EXAM: PELVIS - 1-2 VIEW COMPARISON:  None. FINDINGS: Redemonstration of  the left intertrochanteric fracture with superior displacement of the distal femur. No other appreciable fracture or dislocation. Evaluation of sacrum is limited due to overlying bowel contents. Advanced degenerate disc disease of the lower lumbar spine. IMPRESSION: Left femoral intertrochanteric fracture. Advanced degenerate disc disease of the lumbar spine. Electronically Signed   By: Keane Police D.O.   On: 01/14/2022 17:41   CT Head Wo Contrast  Result Date: 01/14/2022 CLINICAL DATA:  Fall on back.  Unwitnessed fall. EXAM: CT HEAD WITHOUT CONTRAST CT CERVICAL SPINE WITHOUT CONTRAST TECHNIQUE: Multidetector CT imaging of the head and cervical spine was performed following the standard protocol without intravenous contrast. Multiplanar CT image reconstructions of the cervical spine were also generated. RADIATION DOSE REDUCTION: This exam was performed according to the departmental dose-optimization program which includes automated exposure control, adjustment of the mA and/or kV according to patient size and/or use of iterative reconstruction technique. COMPARISON:  CT head dated December 27, 2021. FINDINGS: CT HEAD FINDINGS Brain: No evidence of acute infarction, hemorrhage, hydrocephalus, extra-axial collection or mass lesion/mass effect. Moderate microvascular  ischemic changes of the white matter and mild cerebral volume loss, unchanged. Vascular: No hyperdense vessel or unexpected calcification. Skull: Normal. Negative for fracture or focal lesion. Sinuses/Orbits: No acute finding.  Right cataract surgery. Other: None. CT CERVICAL SPINE FINDINGS Alignment: Anterolisthesis of C3 and C7.  Mild retrolisthesis of C4. Skull base and vertebrae: No acute fracture. No primary bone lesion or focal pathologic process. Soft tissues and spinal canal: No prevertebral fluid or swelling. No visible canal hematoma. Disc levels: Advanced multilevel degenerate disc disease with near complete loss of disc spaces at C4-C5, C5-C6 and C6-C7. Prominent osteophytes. Multilevel uncovertebral joint arthropathy. C2-C3:  No significant findings. C3-C4: Disc osteophyte complex with mild left neural foraminal narrowing. C4-C5: Near complete loss of the disc space with prominent anterior and posterior osteophytes. Focal central canal narrowing. Mild left neural foraminal narrowing. C5-C6: Uncovertebral joint hypertrophy with mild bilateral neural foraminal narrowing. C6-C7: Disc osteophyte complex with mild right neural foraminal narrowing. C7-T1:  No significant finding. Upper chest: Negative. Other: None IMPRESSION: 1.  No acute intracranial abnormality. 2. Mild cerebral volume loss and moderate chronic microvascular ischemic changes of the white matter, unchanged. 3.  No evidence of acute fracture. 4. Advanced multilevel degenerate disc disease most prominent at C4-C5, C5-C6 and C6-C7. Electronically Signed   By: Keane Police D.O.   On: 01/14/2022 17:55   CT Cervical Spine Wo Contrast  Result Date: 01/14/2022 CLINICAL DATA:  Fall on back.  Unwitnessed fall. EXAM: CT HEAD WITHOUT CONTRAST CT CERVICAL SPINE WITHOUT CONTRAST TECHNIQUE: Multidetector CT imaging of the head and cervical spine was performed following the standard protocol without intravenous contrast. Multiplanar CT image  reconstructions of the cervical spine were also generated. RADIATION DOSE REDUCTION: This exam was performed according to the departmental dose-optimization program which includes automated exposure control, adjustment of the mA and/or kV according to patient size and/or use of iterative reconstruction technique. COMPARISON:  CT head dated December 27, 2021. FINDINGS: CT HEAD FINDINGS Brain: No evidence of acute infarction, hemorrhage, hydrocephalus, extra-axial collection or mass lesion/mass effect. Moderate microvascular ischemic changes of the white matter and mild cerebral volume loss, unchanged. Vascular: No hyperdense vessel or unexpected calcification. Skull: Normal. Negative for fracture or focal lesion. Sinuses/Orbits: No acute finding.  Right cataract surgery. Other: None. CT CERVICAL SPINE FINDINGS Alignment: Anterolisthesis of C3 and C7.  Mild retrolisthesis of C4. Skull base and vertebrae: No acute fracture. No primary  bone lesion or focal pathologic process. Soft tissues and spinal canal: No prevertebral fluid or swelling. No visible canal hematoma. Disc levels: Advanced multilevel degenerate disc disease with near complete loss of disc spaces at C4-C5, C5-C6 and C6-C7. Prominent osteophytes. Multilevel uncovertebral joint arthropathy. C2-C3:  No significant findings. C3-C4: Disc osteophyte complex with mild left neural foraminal narrowing. C4-C5: Near complete loss of the disc space with prominent anterior and posterior osteophytes. Focal central canal narrowing. Mild left neural foraminal narrowing. C5-C6: Uncovertebral joint hypertrophy with mild bilateral neural foraminal narrowing. C6-C7: Disc osteophyte complex with mild right neural foraminal narrowing. C7-T1:  No significant finding. Upper chest: Negative. Other: None IMPRESSION: 1.  No acute intracranial abnormality. 2. Mild cerebral volume loss and moderate chronic microvascular ischemic changes of the white matter, unchanged. 3.  No evidence  of acute fracture. 4. Advanced multilevel degenerate disc disease most prominent at C4-C5, C5-C6 and C6-C7. Electronically Signed   By: Keane Police D.O.   On: 01/14/2022 17:55   CT Thoracic Spine Wo Contrast  Result Date: 01/14/2022 CLINICAL DATA:  [place].  Back pain.  Left hip fracture. EXAM: CT THORACIC AND LUMBAR SPINE WITHOUT CONTRAST TECHNIQUE: Multidetector CT imaging of the thoracic and lumbar spine was performed without contrast. Multiplanar CT image reconstructions were also generated. RADIATION DOSE REDUCTION: This exam was performed according to the departmental dose-optimization program which includes automated exposure control, adjustment of the mA and/or kV according to patient size and/or use of iterative reconstruction technique. COMPARISON:  None. FINDINGS: CT THORACIC SPINE FINDINGS Alignment: Normal Vertebrae: Advanced degenerative changes and osteoporosis no acute fracture. The facets are normally aligned. No facet or laminar fractures. The posterior ribs are intact. Paraspinal and other soft tissues: No significant paraspinal findings. No lung lesions or mediastinal hematoma. Disc levels: The spinal canal is fairly generous. No canal stenosis. CT LUMBAR SPINE FINDINGS Segmentation: There are five lumbar type vertebral bodies. The last full intervertebral disc space is labeled L5-S1. Alignment: Severe degenerative lumbar spondylosis and scoliosis. Vertebrae: No acute fracture is identified. Extensive discogenic sclerosis. Paraspinal and other soft tissues: No significant paraspinal findings. Disc levels: Severe degenerative lumbar spondylosis with multilevel disc disease and facet disease. Mild multilevel lateral recess and foraminal stenosis. No significant canal stenosis. IMPRESSION: 1. Advanced degenerative changes and osteoporosis but no acute fracture of the thoracic or lumbar spine. 2. Severe degenerative lumbar spondylosis and scoliosis with multilevel disc disease and facet disease. 3.  No significant canal stenosis. Electronically Signed   By: Marijo Sanes M.D.   On: 01/14/2022 17:52   CT Lumbar Spine Wo Contrast  Result Date: 01/14/2022 CLINICAL DATA:  [place].  Back pain.  Left hip fracture. EXAM: CT THORACIC AND LUMBAR SPINE WITHOUT CONTRAST TECHNIQUE: Multidetector CT imaging of the thoracic and lumbar spine was performed without contrast. Multiplanar CT image reconstructions were also generated. RADIATION DOSE REDUCTION: This exam was performed according to the departmental dose-optimization program which includes automated exposure control, adjustment of the mA and/or kV according to patient size and/or use of iterative reconstruction technique. COMPARISON:  None. FINDINGS: CT THORACIC SPINE FINDINGS Alignment: Normal Vertebrae: Advanced degenerative changes and osteoporosis no acute fracture. The facets are normally aligned. No facet or laminar fractures. The posterior ribs are intact. Paraspinal and other soft tissues: No significant paraspinal findings. No lung lesions or mediastinal hematoma. Disc levels: The spinal canal is fairly generous. No canal stenosis. CT LUMBAR SPINE FINDINGS Segmentation: There are five lumbar type vertebral bodies. The last full intervertebral disc space  is labeled L5-S1. Alignment: Severe degenerative lumbar spondylosis and scoliosis. Vertebrae: No acute fracture is identified. Extensive discogenic sclerosis. Paraspinal and other soft tissues: No significant paraspinal findings. Disc levels: Severe degenerative lumbar spondylosis with multilevel disc disease and facet disease. Mild multilevel lateral recess and foraminal stenosis. No significant canal stenosis. IMPRESSION: 1. Advanced degenerative changes and osteoporosis but no acute fracture of the thoracic or lumbar spine. 2. Severe degenerative lumbar spondylosis and scoliosis with multilevel disc disease and facet disease. 3. No significant canal stenosis. Electronically Signed   By: Marijo Sanes M.D.    On: 01/14/2022 17:52   DG Femur Min 2 Views Left  Result Date: 01/14/2022 CLINICAL DATA:  Fall, deformity EXAM: LEFT FEMUR 2 VIEWS COMPARISON:  None. FINDINGS: There is a displaced comminuted intertrochanteric fracture of the left femur. Distal femur is intact. Left knee arthroplasty. IMPRESSION: Displaced comminuted intertrochanteric fracture of the left femur. Electronically Signed   By: Keane Police D.O.   On: 01/14/2022 17:39    Family History Reviewed and non-contributory, no pertinent history of problems with bleeding or anesthesia    Review of Systems Limited due to the patient's mentation     OBJECTIVE  Vitals:Patient Vitals for the past 8 hrs:  BP Temp Pulse Resp SpO2  01/15/22 0411 132/60 98.8 F (37.1 C) 92 (!) 23 100 %   General: no acute distress Cardiovascular: Extremities are warm Respiratory: No cyanosis, no use of accessory musculature Skin: No lesions in the area of chief complaint  Neurologic: Responds to light touch distally Psychiatric: Does not respond to questions appropriately. Lymphatic: No swelling obvious and reported other than the area involved in the exam below Extremities  LLE: Extremity held in a fixed position.  ROM deferred due to known fracture.  Responds to light touch about the foot.  2+ DP pulse.  Toes are WWP.  Active motion intact in the TA/EHL/GS. RLE: Responds to light touch. 2+ DP pulse.  Toes are WWP.  Active motion intact in the TA/EHL/GS. Tolerates gentle ROM of the hip.  No pain with axial loading.     Test Results Imaging XR of the Left hip demonstrates an unstable Intertrochanteric femur fracture.  Labs cbc Recent Labs    01/14/22 1738 01/15/22 0442  WBC 8.7 8.9  HGB 12.4 10.2*  HCT 38.9 31.9*  PLT 182 163    Labs inflam No results for input(s): CRP in the last 72 hours.  Invalid input(s): ESR  Labs coag No results for input(s): INR, PTT in the last 72 hours.  Invalid input(s): PT  Recent Labs     01/14/22 1738 01/15/22 0442  NA 137 136  K 4.5 4.7  CL 106 103  CO2 21* 23  GLUCOSE 147* 146*  BUN 15 20  CREATININE 1.31* 1.28*  CALCIUM 8.5* 8.5*    fr

## 2022-01-16 DIAGNOSIS — K219 Gastro-esophageal reflux disease without esophagitis: Secondary | ICD-10-CM | POA: Diagnosis not present

## 2022-01-16 DIAGNOSIS — R739 Hyperglycemia, unspecified: Secondary | ICD-10-CM | POA: Diagnosis not present

## 2022-01-16 DIAGNOSIS — S72142A Displaced intertrochanteric fracture of left femur, initial encounter for closed fracture: Secondary | ICD-10-CM | POA: Diagnosis not present

## 2022-01-16 DIAGNOSIS — N1832 Chronic kidney disease, stage 3b: Secondary | ICD-10-CM | POA: Diagnosis not present

## 2022-01-16 LAB — GLUCOSE, CAPILLARY
Glucose-Capillary: 111 mg/dL — ABNORMAL HIGH (ref 70–99)
Glucose-Capillary: 133 mg/dL — ABNORMAL HIGH (ref 70–99)
Glucose-Capillary: 144 mg/dL — ABNORMAL HIGH (ref 70–99)
Glucose-Capillary: 144 mg/dL — ABNORMAL HIGH (ref 70–99)
Glucose-Capillary: 145 mg/dL — ABNORMAL HIGH (ref 70–99)

## 2022-01-16 LAB — HEMOGLOBIN A1C
Hgb A1c MFr Bld: 5.3 % (ref 4.8–5.6)
Mean Plasma Glucose: 105 mg/dL

## 2022-01-16 LAB — CBC
HCT: 25.3 % — ABNORMAL LOW (ref 36.0–46.0)
Hemoglobin: 8.2 g/dL — ABNORMAL LOW (ref 12.0–15.0)
MCH: 31.8 pg (ref 26.0–34.0)
MCHC: 32.4 g/dL (ref 30.0–36.0)
MCV: 98.1 fL (ref 80.0–100.0)
Platelets: 125 10*3/uL — ABNORMAL LOW (ref 150–400)
RBC: 2.58 MIL/uL — ABNORMAL LOW (ref 3.87–5.11)
RDW: 14.3 % (ref 11.5–15.5)
WBC: 10.3 10*3/uL (ref 4.0–10.5)
nRBC: 0 % (ref 0.0–0.2)

## 2022-01-16 NOTE — Progress Notes (Signed)
PROGRESS NOTE     Mallory Steele, is a 88 y.o. female, DOB - [birth year], ZOX:[id number]  Admit date - 01/14/2022   Admitting Physician Mallory Hoit, DO  Outpatient Primary MD for the patient is Dettinger, Fransisca Kaufmann, MD  LOS - 2  Chief Complaint  Patient presents with   Fall        Brief Narrative:   88 y.o. female with medical history significant of dementia, GERD, anxiety, RLS admitted on 01/14/22--with displaced comminuted intertrochanteric fracture of the left femur S/p ORIF on 01/15/22 -Awaiting transfer to SNF rehab pending insurance approval and PASSR Number     -Assessment and Plan:  1)Closed left femoral fracture (Mallory Steele)-  -Orthopedic Left femoral x-ray showed displaced comminuted intertrochanteric fracture of the left femoral. -Orthopedic  surgery (Dr. Amedeo Steele) consult appreciated  -displaced comminuted intertrochanteric fracture of the left femur S/p ORIF/operative intervention on 01/15/2022 continue IV Dilaudid and as needed Percocet as needed for pain control  -continue fall precaution and neurochecks -Physical therapy eval appreciated recommends SNF rehab --Awaiting transfer to SNF rehab pending insurance approval and PASSR Number  Chronic kidney disease, stage 3b (Mallory Steele) BUN/creatinine was 15/1.31 (baseline creatinine at 1.2-1.3). Continue gentle hydration -Creatinine currently stable around 1.3 Renally adjust medications, avoid nephrotoxic agents/dehydration/hypotension  Hyperglycemia -this is possibly reactive -A1c 5.3 Continue to monitor CBG   Fall- (present on admission) Continue fall precautions    GERD without esophagitis/H/o Prior Gi Surgery with "Dumping Syndrome"-  - (present on admission) -Stable, continue Protonix -May need nutritional supplements  Dementia--- at baseline patient has mild to moderate cognitive and memory deficits -Please be advised that the above patient has a primary diagnosis of dementia which supersedes any other  psychiatric diagnosis.   -PTA she was on Cymbalta, trazodone, risperidone and as needed lorazepam  RLS/Rheumatoid Arthritis--currently no longer on Requip   Social/Ethics--Has been on Hospice for > 6 months, she is a DNR  Acute anemia--- suspect related to acute blood loss in setting of hip fracture operative intervention -Monitor H&H and transfuse as clinically indicated -Iron supplements as ordered  Disposition/Need for in-Hospital Stay- patient unable to be discharged at this time due to --- anticipate discharge to SNF rehab after operative fixation of her Lt hip fracture- -Awaiting transfer to SNF rehab pending insurance approval and PASSR Number  Status is: Inpatient   Disposition: The patient is from: Home              Anticipated d/c is to: SNF              Anticipated d/c date is: 1 day              Patient currently is medically stable to d/c. Barriers: -Awaiting transfer to SNF rehab pending insurance approval and PASSR Number  Code Status :  -  Code Status: DNR   Family Communication:    (patient is alert, awake and coherent) -Daughter Mallory Steele--- [phone] Discussed with Niece Mallory Steele who recently moved here from [place]  DVT Prophylaxis  :   - SCDs  SCDs Start: 01/14/22 2122   Lab Results  Component Value Date   PLT 125 (L) 01/16/2022    Inpatient Medications  Scheduled Meds:  Chlorhexidine Gluconate Cloth  6 each Topical Daily   DULoxetine  30 mg Oral Daily   magnesium oxide  400 mg Oral BID   pantoprazole  40 mg Oral Daily   risperiDONE  1 mg Oral BID   Continuous Infusions:  lactated ringers 75 mL/hr  at 01/16/22 0619   PRN Meds:.HYDROmorphone (DILAUDID) injection, LORazepam, oxyCODONE-acetaminophen   Anti-infectives (From admission, onward)    Start     Dose/Rate Route Frequency Ordered Stop   01/15/22 1900  ceFAZolin (ANCEF) IVPB 2g/100 mL premix  Status:  Discontinued        2 g 200 mL/hr over 30 Minutes Intravenous Every 8 hours  01/15/22 1710 01/15/22 1827   01/15/22 1900  ceFAZolin (ANCEF) IVPB 2g/100 mL premix        2 g 200 mL/hr over 30 Minutes Intravenous Every 8 hours 01/15/22 1827 01/16/22 1548   01/15/22 1216  ceFAZolin (ANCEF) 2-4 GM/100ML-% IVPB       Note to Pharmacy: Mallory Steele S: cabinet override      01/15/22 1216 01/15/22 1437   01/15/22 0600  ceFAZolin (ANCEF) IVPB 2g/100 mL premix        2 g 200 mL/hr over 30 Minutes Intravenous On call to O.R. 01/14/22 2226 01/15/22 1400         Subjective: Mallory Steele today has no fevers, no emesis,  No chest pain,  - -No new complaints, -Oral intake is fair --Awaiting transfer to SNF rehab pending insurance approval and PASSR Number  Objective: Vitals:   01/15/22 2103 01/16/22 0002 01/16/22 0533 01/16/22 1434  BP: (!) 107/35 (!) 140/56 (!) 136/57 (!) 96/43  Pulse: 80 71 83 72  Resp: 18 16 20 20   Temp: 98.6 F (37 C) 98 F (36.7 C) 97.8 F (36.6 C) (!) 97.4 F (36.3 C)  TempSrc:   Oral   SpO2: 96% 99% 100% 90%  Weight:      Height:        Intake/Output Summary (Last 24 hours) at 01/16/2022 1631 Last data filed at 01/16/2022 1626 Gross per 24 hour  Intake 1715.63 ml  Output 2300 ml  Net -584.37 ml   Filed Weights   01/15/22 0032  Weight: 68.7 kg    Physical Exam  Gen:- Awake Alert, pleasantly confused, cooperative HEENT:- [place].AT, No sclera icterus Neck-Supple Neck,No JVD,.  Lungs-  CTAB , fair symmetrical air movement CV- S1, S2 normal, regular  Abd-  +ve B.Sounds, Abd Soft, No tenderness,    Extremity/Skin:- No  edema, pedal pulses present  Psych-underlying/baseline memory and cognitive deficits Neuro-generalized weakness, no new focal deficits, no tremors MSK-left hip wound clean dry and intact -  Data Reviewed: I have personally reviewed following labs and imaging studies  CBC: Recent Labs  Lab 01/14/22 1738 01/15/22 0442 01/16/22 0919  WBC 8.7 8.9 10.3  NEUTROABS 6.6  --   --   HGB 12.4 10.2* 8.2*  HCT 38.9  31.9* 25.3*  MCV 97.5 95.5 98.1  PLT 182 163 622*   Basic Metabolic Panel: Recent Labs  Lab 01/14/22 1738 01/15/22 0442  NA 137 136  K 4.5 4.7  CL 106 103  CO2 21* 23  GLUCOSE 147* 146*  BUN 15 20  CREATININE 1.31* 1.28*  CALCIUM 8.5* 8.5*  MG  --  1.7  PHOS  --  3.5   GFR: Estimated Creatinine Clearance: 29.5 mL/min (A) (by C-G formula based on SCr of 1.28 mg/dL (H)). Liver Function Tests: Recent Labs  Lab 01/15/22 0442  AST 23  ALT 13  ALKPHOS 123  BILITOT 0.9  PROT 6.6  ALBUMIN 3.7   Cardiac Enzymes: No results for input(s): CKTOTAL, CKMB, CKMBINDEX, TROPONINI in the last 168 hours. BNP (last 3 results) No results for input(s): PROBNP in the last 8760 hours.  HbA1C: Recent Labs    01/15/22 0442  HGBA1C 5.3   Sepsis Labs: @LABRCNTIP (procalcitonin:4,lacticidven:4) ) Recent Results (from the past 240 hour(s))  Resp Panel by RT-PCR (Flu A&B, Covid) Nasopharyngeal Swab     Status: None   Collection Time: 01/14/22  7:26 PM   Specimen: Nasopharyngeal Swab; Nasopharyngeal(NP) swabs in vial transport medium  Result Value Ref Range Status   SARS Coronavirus 2 by RT PCR NEGATIVE NEGATIVE Final    Comment: (NOTE) SARS-CoV-2 target nucleic acids are NOT DETECTED.  The SARS-CoV-2 RNA is generally detectable in upper respiratory specimens during the acute phase of infection. The lowest concentration of SARS-CoV-2 viral copies this assay can detect is 138 copies/mL. A negative result does not preclude SARS-Cov-2 infection and should not be used as the sole basis for treatment or other patient management decisions. A negative result may occur with  improper specimen collection/handling, submission of specimen other than nasopharyngeal swab, presence of viral mutation(s) within the areas targeted by this assay, and inadequate number of viral copies(<138 copies/mL). A negative result must be combined with clinical observations, patient history, and  epidemiological information. The expected result is Negative.  Fact Sheet for Patients:  EntrepreneurPulse.com.au  Fact Sheet for Healthcare Providers:  IncredibleEmployment.be  This test is no t yet approved or cleared by the [place] FDA and  has been authorized for detection and/or diagnosis of SARS-CoV-2 by FDA under an Emergency Use Authorization (EUA). This EUA will remain  in effect (meaning this test can be used) for the duration of the COVID-19 declaration under Section 564(b)(1) of the Act, 21 U.S.C.section 360bbb-3(b)(1), unless the authorization is terminated  or revoked sooner.       Influenza A by PCR NEGATIVE NEGATIVE Final   Influenza B by PCR NEGATIVE NEGATIVE Final    Comment: (NOTE) The Xpert Xpress SARS-CoV-2/FLU/RSV plus assay is intended as an aid in the diagnosis of influenza from Nasopharyngeal swab specimens and should not be used as a sole basis for treatment. Nasal washings and aspirates are unacceptable for Xpert Xpress SARS-CoV-2/FLU/RSV testing.  Fact Sheet for Patients: EntrepreneurPulse.com.au  Fact Sheet for Healthcare Providers: IncredibleEmployment.be  This test is not yet approved or cleared by the [place] FDA and has been authorized for detection and/or diagnosis of SARS-CoV-2 by FDA under an Emergency Use Authorization (EUA). This EUA will remain in effect (meaning this test can be used) for the duration of the COVID-19 declaration under Section 564(b)(1) of the Act, 21 U.S.C. section 360bbb-3(b)(1), unless the authorization is terminated or revoked.  Performed at [facility], [address] [place], [place] [address]   Surgical pcr screen     Status: None   Collection Time: 01/15/22 12:52 AM   Specimen: Nasal Mucosa; Nasal Swab  Result Value Ref Range Status   MRSA, PCR NEGATIVE NEGATIVE Final   Staphylococcus aureus NEGATIVE NEGATIVE Final     Comment: (NOTE) The Xpert SA Assay (FDA approved for NASAL specimens in patients 46 years of age and older), is one component of a comprehensive surveillance program. It is not intended to diagnose infection nor to guide or monitor treatment. Performed at [facility], [address] [place], [place] [address]       Radiology Studies: DG Pelvis 1-2 Views  Result Date: 01/15/2022 CLINICAL DATA:  Postop EXAM: PELVIS - 1-2 VIEW COMPARISON:  01/14/2022 FINDINGS: Interval intramedullary rod and distal screw fixation of comminuted intertrochanteric fracture. Gas in the soft tissues consistent with recent surgery. IMPRESSION: Interval surgical fixation  of comminuted intertrochanteric fracture on the left with expected surgical changes Electronically Signed   By: Donavan Foil M.D.   On: 01/15/2022 16:28   DG Pelvis 1-2 Views  Result Date: 01/14/2022 CLINICAL DATA:  Unwitnessed fall. EXAM: PELVIS - 1-2 VIEW COMPARISON:  None. FINDINGS: Redemonstration of the left intertrochanteric fracture with superior displacement of the distal femur. No other appreciable fracture or dislocation. Evaluation of sacrum is limited due to overlying bowel contents. Advanced degenerate disc disease of the lower lumbar spine. IMPRESSION: Left femoral intertrochanteric fracture. Advanced degenerate disc disease of the lumbar spine. Electronically Signed   By: Keane Police D.O.   On: 01/14/2022 17:41   CT Head Wo Contrast  Result Date: 01/14/2022 CLINICAL DATA:  Fall on back.  Unwitnessed fall. EXAM: CT HEAD WITHOUT CONTRAST CT CERVICAL SPINE WITHOUT CONTRAST TECHNIQUE: Multidetector CT imaging of the head and cervical spine was performed following the standard protocol without intravenous contrast. Multiplanar CT image reconstructions of the cervical spine were also generated. RADIATION DOSE REDUCTION: This exam was performed according to the departmental dose-optimization program which includes automated exposure control,  adjustment of the mA and/or kV according to patient size and/or use of iterative reconstruction technique. COMPARISON:  CT head dated December 27, 2021. FINDINGS: CT HEAD FINDINGS Brain: No evidence of acute infarction, hemorrhage, hydrocephalus, extra-axial collection or mass lesion/mass effect. Moderate microvascular ischemic changes of the white matter and mild cerebral volume loss, unchanged. Vascular: No hyperdense vessel or unexpected calcification. Skull: Normal. Negative for fracture or focal lesion. Sinuses/Orbits: No acute finding.  Right cataract surgery. Other: None. CT CERVICAL SPINE FINDINGS Alignment: Anterolisthesis of C3 and C7.  Mild retrolisthesis of C4. Skull base and vertebrae: No acute fracture. No primary bone lesion or focal pathologic process. Soft tissues and spinal canal: No prevertebral fluid or swelling. No visible canal hematoma. Disc levels: Advanced multilevel degenerate disc disease with near complete loss of disc spaces at C4-C5, C5-C6 and C6-C7. Prominent osteophytes. Multilevel uncovertebral joint arthropathy. C2-C3:  No significant findings. C3-C4: Disc osteophyte complex with mild left neural foraminal narrowing. C4-C5: Near complete loss of the disc space with prominent anterior and posterior osteophytes. Focal central canal narrowing. Mild left neural foraminal narrowing. C5-C6: Uncovertebral joint hypertrophy with mild bilateral neural foraminal narrowing. C6-C7: Disc osteophyte complex with mild right neural foraminal narrowing. C7-T1:  No significant finding. Upper chest: Negative. Other: None IMPRESSION: 1.  No acute intracranial abnormality. 2. Mild cerebral volume loss and moderate chronic microvascular ischemic changes of the white matter, unchanged. 3.  No evidence of acute fracture. 4. Advanced multilevel degenerate disc disease most prominent at C4-C5, C5-C6 and C6-C7. Electronically Signed   By: Keane Police D.O.   On: 01/14/2022 17:55   CT Cervical Spine Wo  Contrast  Result Date: 01/14/2022 CLINICAL DATA:  Fall on back.  Unwitnessed fall. EXAM: CT HEAD WITHOUT CONTRAST CT CERVICAL SPINE WITHOUT CONTRAST TECHNIQUE: Multidetector CT imaging of the head and cervical spine was performed following the standard protocol without intravenous contrast. Multiplanar CT image reconstructions of the cervical spine were also generated. RADIATION DOSE REDUCTION: This exam was performed according to the departmental dose-optimization program which includes automated exposure control, adjustment of the mA and/or kV according to patient size and/or use of iterative reconstruction technique. COMPARISON:  CT head dated December 27, 2021. FINDINGS: CT HEAD FINDINGS Brain: No evidence of acute infarction, hemorrhage, hydrocephalus, extra-axial collection or mass lesion/mass effect. Moderate microvascular ischemic changes of the white matter and mild cerebral volume  loss, unchanged. Vascular: No hyperdense vessel or unexpected calcification. Skull: Normal. Negative for fracture or focal lesion. Sinuses/Orbits: No acute finding.  Right cataract surgery. Other: None. CT CERVICAL SPINE FINDINGS Alignment: Anterolisthesis of C3 and C7.  Mild retrolisthesis of C4. Skull base and vertebrae: No acute fracture. No primary bone lesion or focal pathologic process. Soft tissues and spinal canal: No prevertebral fluid or swelling. No visible canal hematoma. Disc levels: Advanced multilevel degenerate disc disease with near complete loss of disc spaces at C4-C5, C5-C6 and C6-C7. Prominent osteophytes. Multilevel uncovertebral joint arthropathy. C2-C3:  No significant findings. C3-C4: Disc osteophyte complex with mild left neural foraminal narrowing. C4-C5: Near complete loss of the disc space with prominent anterior and posterior osteophytes. Focal central canal narrowing. Mild left neural foraminal narrowing. C5-C6: Uncovertebral joint hypertrophy with mild bilateral neural foraminal narrowing. C6-C7:  Disc osteophyte complex with mild right neural foraminal narrowing. C7-T1:  No significant finding. Upper chest: Negative. Other: None IMPRESSION: 1.  No acute intracranial abnormality. 2. Mild cerebral volume loss and moderate chronic microvascular ischemic changes of the white matter, unchanged. 3.  No evidence of acute fracture. 4. Advanced multilevel degenerate disc disease most prominent at C4-C5, C5-C6 and C6-C7. Electronically Signed   By: Keane Police D.O.   On: 01/14/2022 17:55   CT Thoracic Spine Wo Contrast  Result Date: 01/14/2022 CLINICAL DATA:  [place].  Back pain.  Left hip fracture. EXAM: CT THORACIC AND LUMBAR SPINE WITHOUT CONTRAST TECHNIQUE: Multidetector CT imaging of the thoracic and lumbar spine was performed without contrast. Multiplanar CT image reconstructions were also generated. RADIATION DOSE REDUCTION: This exam was performed according to the departmental dose-optimization program which includes automated exposure control, adjustment of the mA and/or kV according to patient size and/or use of iterative reconstruction technique. COMPARISON:  None. FINDINGS: CT THORACIC SPINE FINDINGS Alignment: Normal Vertebrae: Advanced degenerative changes and osteoporosis no acute fracture. The facets are normally aligned. No facet or laminar fractures. The posterior ribs are intact. Paraspinal and other soft tissues: No significant paraspinal findings. No lung lesions or mediastinal hematoma. Disc levels: The spinal canal is fairly generous. No canal stenosis. CT LUMBAR SPINE FINDINGS Segmentation: There are five lumbar type vertebral bodies. The last full intervertebral disc space is labeled L5-S1. Alignment: Severe degenerative lumbar spondylosis and scoliosis. Vertebrae: No acute fracture is identified. Extensive discogenic sclerosis. Paraspinal and other soft tissues: No significant paraspinal findings. Disc levels: Severe degenerative lumbar spondylosis with multilevel disc disease and facet  disease. Mild multilevel lateral recess and foraminal stenosis. No significant canal stenosis. IMPRESSION: 1. Advanced degenerative changes and osteoporosis but no acute fracture of the thoracic or lumbar spine. 2. Severe degenerative lumbar spondylosis and scoliosis with multilevel disc disease and facet disease. 3. No significant canal stenosis. Electronically Signed   By: Marijo Sanes M.D.   On: 01/14/2022 17:52   CT Lumbar Spine Wo Contrast  Result Date: 01/14/2022 CLINICAL DATA:  [place].  Back pain.  Left hip fracture. EXAM: CT THORACIC AND LUMBAR SPINE WITHOUT CONTRAST TECHNIQUE: Multidetector CT imaging of the thoracic and lumbar spine was performed without contrast. Multiplanar CT image reconstructions were also generated. RADIATION DOSE REDUCTION: This exam was performed according to the departmental dose-optimization program which includes automated exposure control, adjustment of the mA and/or kV according to patient size and/or use of iterative reconstruction technique. COMPARISON:  None. FINDINGS: CT THORACIC SPINE FINDINGS Alignment: Normal Vertebrae: Advanced degenerative changes and osteoporosis no acute fracture. The facets are normally aligned. No facet or  laminar fractures. The posterior ribs are intact. Paraspinal and other soft tissues: No significant paraspinal findings. No lung lesions or mediastinal hematoma. Disc levels: The spinal canal is fairly generous. No canal stenosis. CT LUMBAR SPINE FINDINGS Segmentation: There are five lumbar type vertebral bodies. The last full intervertebral disc space is labeled L5-S1. Alignment: Severe degenerative lumbar spondylosis and scoliosis. Vertebrae: No acute fracture is identified. Extensive discogenic sclerosis. Paraspinal and other soft tissues: No significant paraspinal findings. Disc levels: Severe degenerative lumbar spondylosis with multilevel disc disease and facet disease. Mild multilevel lateral recess and foraminal stenosis. No  significant canal stenosis. IMPRESSION: 1. Advanced degenerative changes and osteoporosis but no acute fracture of the thoracic or lumbar spine. 2. Severe degenerative lumbar spondylosis and scoliosis with multilevel disc disease and facet disease. 3. No significant canal stenosis. Electronically Signed   By: Marijo Sanes M.D.   On: 01/14/2022 17:52   DG C-Arm 1-60 Min-No Report  Result Date: 01/15/2022 Fluoroscopy was utilized by the requesting physician.  No radiographic interpretation.   DG HIP UNILAT WITH PELVIS 2-3 VIEWS LEFT  Result Date: 01/15/2022 CLINICAL DATA:  Left femoral fracture EXAM: DG HIP (WITH OR WITHOUT PELVIS) 2-3V LEFT COMPARISON:  None. FINDINGS: Multiple intraoperative fluoroscopic spot images are provided. Left intertrochanteric fracture transfixed with a intramedullary nail and interlocking screw in anatomic alignment. FLUOROSCOPY TIME:  Fluoroscopy Time:  1 minute 51 seconds Radiation Exposure Index (if provided by the fluoroscopic device): 31.79 mGy Number of Acquired Spot Images: 0 IMPRESSION: 1. Left intertrochanteric fracture transfixed with a intramedullary nail and interlocking screw in anatomic alignment. Electronically Signed   By: Kathreen Devoid M.D.   On: 01/15/2022 15:53   DG FEMUR MIN 2 VIEWS LEFT  Result Date: 01/15/2022 CLINICAL DATA:  Postop intertrochanteric fracture left femur. EXAM: LEFT FEMUR 2 VIEWS COMPARISON:  Left femur radiographs 01/14/2022 FINDINGS: Interval intramedullary nail fixation with oblique femoral head-neck interlocking screw traversing the previously seen comminuted fracture of the proximal left intertrochanteric region. Improved alignment. No perihardware lucency is seen to indicate hardware failure or loosening. Expected postoperative changes including lateral proximal thigh soft tissue swelling and subcutaneous air. Moderate superior left femoroacetabular joint space narrowing. Total left knee arthroplasty hardware is again noted.  IMPRESSION: Interval ORIF of previously seen left proximal femoral intertrochanteric comminuted fracture with improved now near anatomic alignment. Electronically Signed   By: Yvonne Kendall M.D.   On: 01/15/2022 16:30   DG Femur Min 2 Views Left  Result Date: 01/14/2022 CLINICAL DATA:  Fall, deformity EXAM: LEFT FEMUR 2 VIEWS COMPARISON:  None. FINDINGS: There is a displaced comminuted intertrochanteric fracture of the left femur. Distal femur is intact. Left knee arthroplasty. IMPRESSION: Displaced comminuted intertrochanteric fracture of the left femur. Electronically Signed   By: Keane Police D.O.   On: 01/14/2022 17:39     Scheduled Meds:  Chlorhexidine Gluconate Cloth  6 each Topical Daily   DULoxetine  30 mg Oral Daily   magnesium oxide  400 mg Oral BID   pantoprazole  40 mg Oral Daily   risperiDONE  1 mg Oral BID   Continuous Infusions:  lactated ringers 75 mL/hr at 01/16/22 0619     LOS: 2 days   Roxan Hockey M.D on 01/16/2022 at 4:31 PM  Go to [email or web address] - for contact info  [facility] - Office  [phone]  If 7PM-7AM, please contact night-coverage [email or web address] Password [facility] 01/16/2022, 4:31 PM

## 2022-01-16 NOTE — Evaluation (Signed)
Physical Therapy Evaluation Patient Details Name: Mallory Steele MRN: [id number] DOB: 1932-06-17 Today's Date: 01/16/2022  History of Present Illness  Mallory Steele is a 88 y.o. female with medical history significant of dementia, GERD, anxiety, RLS who presents to the emergency department via EMS due to a fall sustained at home.  Patient states that she has had about 3 falls within the last couple of days, with last fall being today.  Patient states that she landed on her left side with subsequent pain on same side.  EMS was activated and patient was sent to the ED for further evaluation and management.  At baseline, patient states that she ambulates with a walker and she lives with daughter.  She denies chest pain, shortness of breath, headache, fever, chills, palpitations, nausea, vomiting, abdominal pain.   Clinical Impression  Patient presents supine in bed. She is awake, alert and cooperative. She has noted history of dementia, but she is able to answer most questions asked at present. Her niece is present at bedside and substituted answers patient unsure of. Patient noting elevated pain levels. She requires Mod A with bed mobility to assist LLE to edge of bed for supine to sit. She relies heavily on use of arms for bed mobility and seated balance. Patient is unable to maintain seated balance independently and slumps toward RT posterior. Patient not able to sit upright on her own so sit to stand not attempted. Instructed patient on seated ankle pumps for strengthening. Patient with poor tolerance to sitting as she becomes lethargic. Could be BP related, or medication side effects. Patient returned to bed with Mod A and repositioning to supine with HOB elevated. Instructed patient on supine ankle pumps and RLE heel slides for LE strengthening. Patient with phone and call bell in reach, Niece present at bedside, bed alarm set. Patient will benefit from continued physical therapy in hospital and  recommended venue below to increase strength, balance, endurance for safe ADLs and gait.     Recommendations for follow up therapy are one component of a multi-disciplinary discharge planning process, led by the attending physician.  Recommendations may be updated based on patient status, additional functional criteria and insurance authorization.  Follow Up Recommendations Skilled nursing-short term rehab (<3 hours/day)    Assistance Recommended at Discharge Frequent or constant Supervision/Assistance  Patient can return home with the following  A lot of help with walking and/or transfers;A lot of help with bathing/dressing/bathroom;Help with stairs or ramp for entrance;Assistance with cooking/housework    Equipment Recommendations Rolling walker (2 wheels) (confirm that patient has RW and not rollator, patient and family member present unclear)  Recommendations for Other Services  OT consult    Functional Status Assessment Patient has had a recent decline in their functional status and demonstrates the ability to make significant improvements in function in a reasonable and predictable amount of time.     Precautions / Restrictions Precautions Precautions: Fall Restrictions Weight Bearing Restrictions: Yes LLE Weight Bearing: Weight bearing as tolerated      Mobility  Bed Mobility Overal bed mobility: Needs Assistance Bed Mobility: Supine to Sit, Sit to Supine     Supine to sit: Mod assist Sit to supine: Mod assist   General bed mobility comments: Mod A for LLE    Transfers Overall transfer level: Needs assistance   Transfers: Sit to/from Stand Sit to Stand: Mod assist           General transfer comment: Mod A to maintain upright  seated position at EOB, unable to stand    Ambulation/Gait                  Stairs            Wheelchair Mobility    Modified Rankin (Stroke Patients Only)       Balance Overall balance assessment: Needs  assistance Sitting-balance support: Bilateral upper extremity supported, Feet supported Sitting balance-Leahy Scale: Poor Sitting balance - Comments: Seated at EOB Postural control: Posterior lean, Right lateral lean                                   Pertinent Vitals/Pain Pain Assessment Pain Assessment: 0-10 Pain Score: 10-Worst pain ever Pain Location: Lt hip Pain Descriptors / Indicators: Aching, Sore Pain Intervention(s): Limited activity within patient's tolerance, Monitored during session    Home Living Family/patient expects to be discharged to:: Private residence Living Arrangements: Children Available Help at Discharge: Family Type of Home: House Home Access: Stairs to enter Entrance Stairs-Rails: Can reach both Entrance Stairs-Number of Steps: 4-5   Home Layout: One level Home Equipment: Advice worker (2 wheels);Cane - single point      Prior Function Prior Level of Function : Needs assist  Cognitive Assist : ADLs (cognitive)     Physical Assist : Mobility (physical) Mobility (physical): Transfers;Bed mobility;Stairs     ADLs Comments: Patient reports she has help at home with ADLs and functional mobility     Hand Dominance        Extremity/Trunk Assessment   Upper Extremity Assessment Upper Extremity Assessment: Defer to OT evaluation;Generalized weakness    Lower Extremity Assessment Lower Extremity Assessment: Generalized weakness    Cervical / Trunk Assessment Cervical / Trunk Assessment: Kyphotic  Communication   Communication: Expressive difficulties  Cognition Arousal/Alertness: Awake/alert Behavior During Therapy: WFL for tasks assessed/performed Overall Cognitive Status: Impaired/Different from baseline                                 General Comments: Noted history of Dementia        General Comments      Exercises General Exercises - Lower Extremity Ankle Circles/Pumps: Supine,  Seated, Both, 10 reps Long Arc Quad: Strengthening, Both, 5 reps, Seated Heel Slides: Right, Strengthening, 5 reps, Supine   Assessment/Plan    PT Assessment Patient needs continued PT services  PT Problem List Decreased strength;Decreased mobility;Decreased safety awareness;Decreased range of motion;Decreased activity tolerance;Decreased balance;Pain       PT Treatment Interventions DME instruction;Therapeutic exercise;Manual techniques;Balance training;Gait training;Stair training;Neuromuscular re-education;Modalities;Functional mobility training;Therapeutic activities;Patient/family education    PT Goals (Current goals can be found in the Care Plan section)  Acute Rehab PT Goals Patient Stated Goal: Return home PT Goal Formulation: With patient/family Time For Goal Achievement: 01/30/22 Potential to Achieve Goals: Good    Frequency Min 3X/week     Co-evaluation               AM-PAC PT "6 Clicks" Mobility  Outcome Measure Help needed turning from your back to your side while in a flat bed without using bedrails?: A Lot Help needed moving from lying on your back to sitting on the side of a flat bed without using bedrails?: A Lot Help needed moving to and from a bed to a chair (including a wheelchair)?: A Lot Help needed standing  up from a chair using your arms (e.g., wheelchair or bedside chair)?: A Lot Help needed to walk in hospital room?: Total Help needed climbing 3-5 steps with a railing? : Total 6 Click Score: 10    End of Session Equipment Utilized During Treatment: Gait belt Activity Tolerance: Patient limited by fatigue;Patient limited by pain Patient left: with call bell/phone within reach;with family/visitor present;with bed alarm set;in bed Nurse Communication: Mobility status PT Visit Diagnosis: Unsteadiness on feet (R26.81);Other abnormalities of gait and mobility (R26.89);Muscle weakness (generalized) (M62.81);Difficulty in walking, not elsewhere  classified (R26.2)    Time: 1330-1403 PT Time Calculation (min) (ACUTE ONLY): 33 min   Charges:   PT Evaluation $PT Eval Low Complexity: 1 Low PT Treatments $Therapeutic Exercise: 8-22 mins   2:24 PM, 01/16/22 Josue Hector PT DPT  Physical Therapist with [facility]  [phone]

## 2022-01-16 NOTE — Progress Notes (Signed)
° °  ORTHOPAEDIC PROGRESS NOTE  s/p Procedure(s): INTRAMEDULLARY (IM) NAIL INTERTROCHANTERIC; L hip  DOS: 01/15/22  SUBJECTIVE: No issues over night.  Patient resting.  She does not answer questions.  No family in the room.   OBJECTIVE: PE:  Sleeping.  No acute distress.  Awakes easily  Lateral hip dressings are clean, dry and intact.  No drainage.  2+ DP pulse Responds to light touch on the foot Spontaneous motion  Vitals:   01/16/22 0002 01/16/22 0533  BP: (!) 140/56 (!) 136/57  Pulse: 71 83  Resp: 16 20  Temp: 98 F (36.7 C) 97.8 F (36.6 C)  SpO2: 99% 100%     ASSESSMENT: Mallory Steele is a 88 y.o. female doing well postoperatively.  Stable, no issues.   PLAN: Weightbearing: WBAT LLE Insicional and dressing care: Reinforce dressings as needed Orthopedic device(s): None VTE prophylaxis: Aspirin 81mg  BID  6 weeks; recommended.  No contraindications Pain control: PRN PO medications; judicious use of narcotics Follow - up plan: 2 weeks for incision check.  If she goes to a facility, there are no sutures that need to be removed.  We can schedule a follow up in 6 weeks.  Sutures ends should be trimmed and steri strips replaced around 2 weeks postop   Contact information:     Darlen Gledhill A. Amedeo Kinsman, MD [place] [address] [place],  [place]  [address] Phone: [phone] Fax: [phone]

## 2022-01-16 NOTE — Plan of Care (Signed)
°  Problem: Acute Rehab PT Goals(only PT should resolve) Goal: Pt Will Go Supine/Side To Sit Flowsheets (Taken 01/16/2022 1425) Pt will go Supine/Side to Sit: with minimal assist Goal: Patient Will Transfer Sit To/From Stand Flowsheets (Taken 01/16/2022 1425) Patient will transfer sit to/from stand: with minimal assist Goal: Pt Will Transfer Bed To Chair/Chair To Bed Flowsheets (Taken 01/16/2022 1425) Pt will Transfer Bed to Chair/Chair to Bed: with min assist Goal: Pt Will Ambulate Flowsheets (Taken 01/16/2022 1425) Pt will Ambulate:  75 feet  with modified independence  with min guard assist  with rolling walker Goal: Pt Will Go Up/Down Stairs Flowsheets (Taken 01/16/2022 1425) Pt will Go Up / Down Stairs:  3-5 stairs  with modified independence  with minimal assist  with rail(s)   2:26 PM, 01/16/22 Josue Hector PT DPT  Physical Therapist with [facility]  [phone]

## 2022-01-16 NOTE — Progress Notes (Signed)
Requested diet from on call. No new orders at this time,pt is alert and able to make needs known. Will continue to monitor, bed alarm on.

## 2022-01-17 DIAGNOSIS — K219 Gastro-esophageal reflux disease without esophagitis: Secondary | ICD-10-CM | POA: Diagnosis not present

## 2022-01-17 DIAGNOSIS — W19XXXA Unspecified fall, initial encounter: Secondary | ICD-10-CM | POA: Diagnosis not present

## 2022-01-17 DIAGNOSIS — S72142A Displaced intertrochanteric fracture of left femur, initial encounter for closed fracture: Secondary | ICD-10-CM | POA: Diagnosis not present

## 2022-01-17 DIAGNOSIS — N1832 Chronic kidney disease, stage 3b: Secondary | ICD-10-CM | POA: Diagnosis not present

## 2022-01-17 LAB — BASIC METABOLIC PANEL
Anion gap: 6 (ref 5–15)
BUN: 15 mg/dL (ref 8–23)
CO2: 28 mmol/L (ref 22–32)
Calcium: 7.7 mg/dL — ABNORMAL LOW (ref 8.9–10.3)
Chloride: 101 mmol/L (ref 98–111)
Creatinine, Ser: 1.21 mg/dL — ABNORMAL HIGH (ref 0.44–1.00)
GFR, Estimated: 43 mL/min — ABNORMAL LOW (ref >60)
Glucose, Bld: 124 mg/dL — ABNORMAL HIGH (ref 70–99)
Potassium: 3.7 mmol/L (ref 3.5–5.1)
Sodium: 135 mmol/L (ref 135–145)

## 2022-01-17 LAB — CBC
HCT: 22.2 % — ABNORMAL LOW (ref 36.0–46.0)
Hemoglobin: 7.2 g/dL — ABNORMAL LOW (ref 12.0–15.0)
MCH: 32 pg (ref 26.0–34.0)
MCHC: 32.4 g/dL (ref 30.0–36.0)
MCV: 98.7 fL (ref 80.0–100.0)
Platelets: 103 10*3/uL — ABNORMAL LOW (ref 150–400)
RBC: 2.25 MIL/uL — ABNORMAL LOW (ref 3.87–5.11)
RDW: 14.4 % (ref 11.5–15.5)
WBC: 7.8 10*3/uL (ref 4.0–10.5)
nRBC: 0 % (ref 0.0–0.2)

## 2022-01-17 LAB — PREPARE RBC (CROSSMATCH)

## 2022-01-17 LAB — GLUCOSE, CAPILLARY
Glucose-Capillary: 115 mg/dL — ABNORMAL HIGH (ref 70–99)
Glucose-Capillary: 121 mg/dL — ABNORMAL HIGH (ref 70–99)
Glucose-Capillary: 124 mg/dL — ABNORMAL HIGH (ref 70–99)

## 2022-01-17 MED ORDER — FUROSEMIDE 10 MG/ML IJ SOLN
20.0000 mg | Freq: Once | INTRAMUSCULAR | Status: AC
  Administered 2022-01-17: 20 mg via INTRAVENOUS
  Filled 2022-01-17: qty 2

## 2022-01-17 MED ORDER — SODIUM CHLORIDE 0.9% IV SOLUTION: Freq: Once | INTRAVENOUS | Status: AC

## 2022-01-17 NOTE — Progress Notes (Addendum)
PROGRESS NOTE     Mallory Steele, is a 88 y.o. female, DOB - 08-10-1932, GGY:[id number]  Admit date - 01/14/2022   Admitting Physician Bernadette Hoit, DO  Outpatient Primary MD for the patient is Dettinger, Fransisca Kaufmann, MD  LOS - 3  Chief Complaint  Patient presents with   Fall        Brief Narrative:   88 y.o. female with medical history significant of dementia, GERD, anxiety, RLS admitted on 01/14/22--with displaced comminuted intertrochanteric fracture of the left femur S/p ORIF on 01/15/22 -Awaiting transfer to SNF rehab pending insurance approval and PASSR Number    -Assessment and Plan:  1)Closed left femoral fracture ([place])-  -Orthopedic Left femoral x-ray showed displaced comminuted intertrochanteric fracture of the left femoral. -Orthopedic  surgery (Dr. Amedeo Kinsman) consult appreciated  -displaced comminuted intertrochanteric fracture of the left femur S/p ORIF/operative intervention on 01/15/2022 -Weightbearing: WBAT LLE Insicional and dressing care: Reinforce dressings as needed VTE prophylaxis: Aspirin 81mg  BID  6 weeks; recommended.   Follow - up ortho plan: 2 weeks for incision check.  If she goes to a facility, there are no sutures that need to be removed.  We can schedule a follow up in 6 weeks.  Sutures ends should be trimmed and steri strips replaced around 2 weeks postop  -Physical therapy eval appreciated recommends SNF rehab --Awaiting transfer to SNF rehab pending insurance approval and PASSR Number  Chronic kidney disease, stage 3b (HCC) BUN/creatinine was 15/1.31 (baseline creatinine at 1.2-1.3). Continue gentle hydration -Creatinine currently stable around 1.3 Renally adjust medications, avoid nephrotoxic agents/dehydration/hypotension  Hyperglycemia -this is possibly reactive -A1c 5.3 Continue to monitor CBG   Fall- (present on admission) Continue fall precautions   GERD without esophagitis/H/o Prior Gi Surgery with "Dumping Syndrome"-  - (present  on admission) -Stable, continue Protonix -May need nutritional supplements  Dementia--- at baseline patient has mild to moderate cognitive and memory deficits -Please be advised that the above patient has a primary diagnosis of dementia which supersedes any other psychiatric diagnosis.   -PTA she was on Cymbalta, trazodone, risperidone and as needed lorazepam  RLS/Rheumatoid Arthritis--currently no longer on Requip   Social/Ethics--Has been on Hospice for > 6 months, she is a DNR  Acute anemia--- suspect related to acute blood loss in setting of hip fracture operative intervention -Hgb down to 7.2, patient with fatigue, weakness, dizziness with attempts to sit up--we will transfuse 1 unit of PRBC for symptomatic anemia -Monitor H&H and transfuse as clinically indicated -Iron supplements as ordered  Disposition/Need for in-Hospital Stay- patient unable to be discharged at this time due to --- anticipate discharge to SNF rehab after operative fixation of her Lt hip fracture- -Awaiting transfer to SNF rehab pending insurance approval and PASSR Number  Status is: Inpatient   Disposition: The patient is from: Home              Anticipated d/c is to: SNF              Anticipated d/c date is: 1 day              Patient currently is medically stable to d/c. Barriers: -Awaiting transfer to SNF rehab pending insurance approval and PASSR Number  Code Status :  -  Code Status: DNR   Family Communication:    (patient is alert, awake and coherent) -Daughter Mallory Steele--- [phone] Discussed with Niece Mallory Steele who recently moved here from [place]  DVT Prophylaxis  :   - SCDs  SCDs  Start: 01/14/22 2122   Lab Results  Component Value Date   PLT 103 (L) 01/17/2022    Inpatient Medications  Scheduled Meds:  Chlorhexidine Gluconate Cloth  6 each Topical Daily   DULoxetine  30 mg Oral Daily   pantoprazole  40 mg Oral Daily   risperiDONE  1 mg Oral BID   Continuous Infusions:   lactated ringers 75 mL/hr at 01/16/22 2000   PRN Meds:.HYDROmorphone (DILAUDID) injection, LORazepam, oxyCODONE-acetaminophen   Anti-infectives (From admission, onward)    Start     Dose/Rate Route Frequency Ordered Stop   01/15/22 1900  ceFAZolin (ANCEF) IVPB 2g/100 mL premix  Status:  Discontinued        2 g 200 mL/hr over 30 Minutes Intravenous Every 8 hours 01/15/22 1710 01/15/22 1827   01/15/22 1900  ceFAZolin (ANCEF) IVPB 2g/100 mL premix        2 g 200 mL/hr over 30 Minutes Intravenous Every 8 hours 01/15/22 1827 01/16/22 1548   01/15/22 1216  ceFAZolin (ANCEF) 2-4 GM/100ML-% IVPB       Note to Pharmacy: Abbie Sons S: cabinet override      01/15/22 1216 01/15/22 1437   01/15/22 0600  ceFAZolin (ANCEF) IVPB 2g/100 mL premix        2 g 200 mL/hr over 30 Minutes Intravenous On call to O.R. 01/14/22 2226 01/15/22 1400        Subjective: Mallory Steele today has no fevers, no emesis,  No chest pain,  - --Awaiting transfer to SNF rehab pending insurance approval and PASSR Number - Hgb down to 7.2, patient with fatigue, weakness, dizziness with attempts to sit up--we will transfuse 1 unit of PRBC for symptomatic anemia  Objective: Vitals:   01/17/22 1217 01/17/22 1227 01/17/22 1430 01/17/22 1528  BP: (!) 119/48 (!) 119/48 (!) 114/52 (!) 124/92  Pulse: 86 86 78 74  Resp: 20 20 18 20   Temp: 98.4 F (36.9 C) 98.4 F (36.9 C) 98 F (36.7 C) 99.4 F (37.4 C)  TempSrc:  Axillary Axillary Axillary  SpO2:  98% 98% 99%  Weight:      Height:        Intake/Output Summary (Last 24 hours) at 01/17/2022 1825 Last data filed at 01/17/2022 1812 Gross per 24 hour  Intake 1793.81 ml  Output 900 ml  Net 893.81 ml   Filed Weights   01/15/22 0032  Weight: 68.7 kg    Physical Exam  Gen:- Awake Alert, pleasantly confused, cooperative HEENT:- .AT, No sclera icterus Neck-Supple Neck,No JVD,.  Lungs-  CTAB , fair symmetrical air movement CV- S1, S2 normal, regular  Abd-   +ve B.Sounds, Abd Soft, No tenderness,    Extremity/Skin:- No  edema, pedal pulses present  Psych-underlying/baseline memory and cognitive deficits Neuro-generalized weakness, no new focal deficits, no tremors MSK-left hip wound clean dry and intact - Data Reviewed: I have personally reviewed following labs and imaging studies  CBC: Recent Labs  Lab 01/14/22 1738 01/15/22 0442 01/16/22 0919 01/17/22 0444  WBC 8.7 8.9 10.3 7.8  NEUTROABS 6.6  --   --   --   HGB 12.4 10.2* 8.2* 7.2*  HCT 38.9 31.9* 25.3* 22.2*  MCV 97.5 95.5 98.1 98.7  PLT 182 163 125* 502*   Basic Metabolic Panel: Recent Labs  Lab 01/14/22 1738 01/15/22 0442 01/17/22 0444  NA 137 136 135  K 4.5 4.7 3.7  CL 106 103 101  CO2 21* 23 28  GLUCOSE 147* 146* 124*  BUN 15  20 15  CREATININE 1.31* 1.28* 1.21*  CALCIUM 8.5* 8.5* 7.7*  MG  --  1.7  --   PHOS  --  3.5  --    GFR: Estimated Creatinine Clearance: 31.2 mL/min (A) (by C-G formula based on SCr of 1.21 mg/dL (H)). Liver Function Tests: Recent Labs  Lab 01/15/22 0442  AST 23  ALT 13  ALKPHOS 123  BILITOT 0.9  PROT 6.6  ALBUMIN 3.7   Cardiac Enzymes: No results for input(s): CKTOTAL, CKMB, CKMBINDEX, TROPONINI in the last 168 hours. BNP (last 3 results) No results for input(s): PROBNP in the last 8760 hours. HbA1C: Recent Labs    01/15/22 0442  HGBA1C 5.3   Sepsis Labs: @LABRCNTIP (procalcitonin:4,lacticidven:4) ) Recent Results (from the past 240 hour(s))  Resp Panel by RT-PCR (Flu A&B, Covid) Nasopharyngeal Swab     Status: None   Collection Time: 01/14/22  7:26 PM   Specimen: Nasopharyngeal Swab; Nasopharyngeal(NP) swabs in vial transport medium  Result Value Ref Range Status   SARS Coronavirus 2 by RT PCR NEGATIVE NEGATIVE Final    Comment: (NOTE) SARS-CoV-2 target nucleic acids are NOT DETECTED.  The SARS-CoV-2 RNA is generally detectable in upper respiratory specimens during the acute phase of infection. The  lowest concentration of SARS-CoV-2 viral copies this assay can detect is 138 copies/mL. A negative result does not preclude SARS-Cov-2 infection and should not be used as the sole basis for treatment or other patient management decisions. A negative result may occur with  improper specimen collection/handling, submission of specimen other than nasopharyngeal swab, presence of viral mutation(s) within the areas targeted by this assay, and inadequate number of viral copies(<138 copies/mL). A negative result must be combined with clinical observations, patient history, and epidemiological information. The expected result is Negative.  Fact Sheet for Patients:  EntrepreneurPulse.com.au  Fact Sheet for Healthcare Providers:  IncredibleEmployment.be  This test is no t yet approved or cleared by the [place] FDA and  has been authorized for detection and/or diagnosis of SARS-CoV-2 by FDA under an Emergency Use Authorization (EUA). This EUA will remain  in effect (meaning this test can be used) for the duration of the COVID-19 declaration under Section 564(b)(1) of the Act, 21 U.S.C.section 360bbb-3(b)(1), unless the authorization is terminated  or revoked sooner.       Influenza A by PCR NEGATIVE NEGATIVE Final   Influenza B by PCR NEGATIVE NEGATIVE Final    Comment: (NOTE) The Xpert Xpress SARS-CoV-2/FLU/RSV plus assay is intended as an aid in the diagnosis of influenza from Nasopharyngeal swab specimens and should not be used as a sole basis for treatment. Nasal washings and aspirates are unacceptable for Xpert Xpress SARS-CoV-2/FLU/RSV testing.  Fact Sheet for Patients: EntrepreneurPulse.com.au  Fact Sheet for Healthcare Providers: IncredibleEmployment.be  This test is not yet approved or cleared by the [place] FDA and has been authorized for detection and/or diagnosis of SARS-CoV-2 by FDA under  an Emergency Use Authorization (EUA). This EUA will remain in effect (meaning this test can be used) for the duration of the COVID-19 declaration under Section 564(b)(1) of the Act, 21 U.S.C. section 360bbb-3(b)(1), unless the authorization is terminated or revoked.  Performed at [facility], [address] [place], [place] [address]   Surgical pcr screen     Status: None   Collection Time: 01/15/22 12:52 AM   Specimen: Nasal Mucosa; Nasal Swab  Result Value Ref Range Status   MRSA, PCR NEGATIVE NEGATIVE Final   Staphylococcus aureus NEGATIVE NEGATIVE Final  Comment: (NOTE) The Xpert SA Assay (FDA approved for NASAL specimens in patients 44 years of age and older), is one component of a comprehensive surveillance program. It is not intended to diagnose infection nor to guide or monitor treatment. Performed at [facility], [address], [place], [place] [address]      Radiology Studies: No results found.   Scheduled Meds:  Chlorhexidine Gluconate Cloth  6 each Topical Daily   DULoxetine  30 mg Oral Daily   pantoprazole  40 mg Oral Daily   risperiDONE  1 mg Oral BID   Continuous Infusions:  lactated ringers 75 mL/hr at 01/16/22 2000     LOS: 3 days   Roxan Hockey M.D on 01/17/2022 at 6:25 PM  Go to [email or web address] - for contact info  [facility] - Office  [phone]  If 7PM-7AM, please contact night-coverage [email or web address] Password [facility] 01/17/2022, 6:25 PM

## 2022-01-17 NOTE — TOC Progression Note (Addendum)
Transition of Care [facility]) - Progression Note    Patient Details  Name: Mallory Steele MRN: [id number] Date of Birth: Jun 17, 1932  [facility] CM/SW Contact  Salome Arnt,  Phone Number: 01/17/2022, 8:37 AM  Clinical Narrative: LCSW provided bed offers to pt's daughter who chooses [facility]. Facility notified to start authorization as pt's [employer] is not managed by [employer]. TOC will continue to follow.     Update: Pt's daughter called LCSW and now requests [place]. [place] and [facility] notified and [facility] will start authorization. Still awaiting PASRR.    Expected Discharge Plan: [place] Barriers to Discharge: Continued Medical Work up  Expected Discharge Plan and Services Expected Discharge Plan: [place] In-house Referral: Clinical Social Work   Post Acute Care Choice: [place] Living arrangements for the past 2 months: Single Family Home                                       Social Determinants of Health (SDOH) Interventions    Readmission Risk Interventions No flowsheet data found.

## 2022-01-17 NOTE — Progress Notes (Signed)
° °  ORTHOPAEDIC PROGRESS NOTE  s/p Procedure(s): INTRAMEDULLARY (IM) NAIL INTERTROCHANTERIC; L hip  DOS: 01/15/22  SUBJECTIVE: Patient resting.  No issues since yesterday.  No family in the room.    OBJECTIVE: PE:  Sleeping.  No acute distress.  Awakes easily Incoherent  Lateral hip dressings are clean, dry and intact.  No drainage.  2+ DP pulse Responds to light touch on the foot Spontaneous motion  Vitals:   01/17/22 1430 01/17/22 1528  BP: (!) 114/52 (!) 124/92  Pulse: 78 74  Resp: 18 20  Temp: 98 F (36.7 C) 99.4 F (37.4 C)  SpO2: 98% 99%   CBC Latest Ref Rng & Units 01/17/2022 01/16/2022 01/15/2022  WBC 4.0 - 10.5 K/uL 7.8 10.3 8.9  Hemoglobin 12.0 - 15.0 g/dL 7.2(L) 8.2(L) 10.2(L)  Hematocrit 36.0 - 46.0 % 22.2(L) 25.3(L) 31.9(L)  Platelets 150 - 400 K/uL 103(L) 125(L) 163     ASSESSMENT: Mallory Steele is a 88 y.o. female doing well postoperatively.  Stable, no issues.   PLAN: Weightbearing: WBAT LLE Insicional and dressing care: Reinforce dressings as needed Orthopedic device(s): None VTE prophylaxis: Aspirin 81mg  BID  6 weeks; recommended.  No contraindications Pain control: PRN PO medications; judicious use of narcotics Follow - up plan: 2 weeks for incision check.  If she goes to a facility, there are no sutures that need to be removed.  We can schedule a follow up in 6 weeks.  Sutures ends should be trimmed and steri strips replaced around 2 weeks postop   Contact information:     [facility] A. Amedeo Kinsman, MD [place] [address] [place],  [place]  [address] Phone: [phone] Fax: [phone]

## 2022-01-18 DIAGNOSIS — S72142S Displaced intertrochanteric fracture of left femur, sequela: Secondary | ICD-10-CM | POA: Diagnosis not present

## 2022-01-18 DIAGNOSIS — S72142D Displaced intertrochanteric fracture of left femur, subsequent encounter for closed fracture with routine healing: Secondary | ICD-10-CM | POA: Diagnosis not present

## 2022-01-18 DIAGNOSIS — R739 Hyperglycemia, unspecified: Secondary | ICD-10-CM | POA: Diagnosis not present

## 2022-01-18 DIAGNOSIS — G2581 Restless legs syndrome: Secondary | ICD-10-CM | POA: Diagnosis not present

## 2022-01-18 DIAGNOSIS — K219 Gastro-esophageal reflux disease without esophagitis: Secondary | ICD-10-CM | POA: Diagnosis not present

## 2022-01-18 DIAGNOSIS — R279 Unspecified lack of coordination: Secondary | ICD-10-CM | POA: Diagnosis not present

## 2022-01-18 DIAGNOSIS — Z7401 Bed confinement status: Secondary | ICD-10-CM | POA: Diagnosis not present

## 2022-01-18 DIAGNOSIS — I959 Hypotension, unspecified: Secondary | ICD-10-CM | POA: Diagnosis not present

## 2022-01-18 DIAGNOSIS — I471 Supraventricular tachycardia: Secondary | ICD-10-CM | POA: Diagnosis not present

## 2022-01-18 DIAGNOSIS — N390 Urinary tract infection, site not specified: Secondary | ICD-10-CM | POA: Diagnosis not present

## 2022-01-18 DIAGNOSIS — K5900 Constipation, unspecified: Secondary | ICD-10-CM | POA: Diagnosis not present

## 2022-01-18 DIAGNOSIS — R339 Retention of urine, unspecified: Secondary | ICD-10-CM | POA: Diagnosis not present

## 2022-01-18 DIAGNOSIS — Z4789 Encounter for other orthopedic aftercare: Secondary | ICD-10-CM | POA: Diagnosis not present

## 2022-01-18 DIAGNOSIS — G4700 Insomnia, unspecified: Secondary | ICD-10-CM | POA: Diagnosis not present

## 2022-01-18 DIAGNOSIS — N1832 Chronic kidney disease, stage 3b: Secondary | ICD-10-CM | POA: Diagnosis not present

## 2022-01-18 DIAGNOSIS — F419 Anxiety disorder, unspecified: Secondary | ICD-10-CM | POA: Diagnosis not present

## 2022-01-18 DIAGNOSIS — J45909 Unspecified asthma, uncomplicated: Secondary | ICD-10-CM | POA: Diagnosis not present

## 2022-01-18 DIAGNOSIS — G40909 Epilepsy, unspecified, not intractable, without status epilepticus: Secondary | ICD-10-CM | POA: Diagnosis not present

## 2022-01-18 DIAGNOSIS — C8339 Diffuse large B-cell lymphoma, extranodal and solid organ sites: Secondary | ICD-10-CM | POA: Diagnosis not present

## 2022-01-18 DIAGNOSIS — S7292XD Unspecified fracture of left femur, subsequent encounter for closed fracture with routine healing: Secondary | ICD-10-CM | POA: Diagnosis not present

## 2022-01-18 DIAGNOSIS — W19XXXA Unspecified fall, initial encounter: Secondary | ICD-10-CM | POA: Diagnosis not present

## 2022-01-18 DIAGNOSIS — S72142A Displaced intertrochanteric fracture of left femur, initial encounter for closed fracture: Secondary | ICD-10-CM | POA: Diagnosis not present

## 2022-01-18 DIAGNOSIS — F03918 Unspecified dementia, unspecified severity, with other behavioral disturbance: Secondary | ICD-10-CM | POA: Diagnosis not present

## 2022-01-18 DIAGNOSIS — I10 Essential (primary) hypertension: Secondary | ICD-10-CM | POA: Diagnosis not present

## 2022-01-18 DIAGNOSIS — F0390 Unspecified dementia without behavioral disturbance: Secondary | ICD-10-CM | POA: Diagnosis not present

## 2022-01-18 LAB — TYPE AND SCREEN
ABO/RH(D): A NEG
Antibody Screen: NEGATIVE
Unit division: 0

## 2022-01-18 LAB — BPAM RBC
Blood Product Expiration Date: 202303212359
ISSUE DATE / TIME: 202302261157
Unit Type and Rh: 600

## 2022-01-18 LAB — GLUCOSE, CAPILLARY
Glucose-Capillary: 100 mg/dL — ABNORMAL HIGH (ref 70–99)
Glucose-Capillary: 115 mg/dL — ABNORMAL HIGH (ref 70–99)
Glucose-Capillary: 115 mg/dL — ABNORMAL HIGH (ref 70–99)
Glucose-Capillary: 98 mg/dL (ref 70–99)

## 2022-01-18 LAB — CBC
HCT: 26.3 % — ABNORMAL LOW (ref 36.0–46.0)
Hemoglobin: 8.4 g/dL — ABNORMAL LOW (ref 12.0–15.0)
MCH: 31.2 pg (ref 26.0–34.0)
MCHC: 31.9 g/dL (ref 30.0–36.0)
MCV: 97.8 fL (ref 80.0–100.0)
Platelets: 116 K/uL — ABNORMAL LOW (ref 150–400)
RBC: 2.69 MIL/uL — ABNORMAL LOW (ref 3.87–5.11)
RDW: 14 % (ref 11.5–15.5)
WBC: 7.8 K/uL (ref 4.0–10.5)
nRBC: 0 % (ref 0.0–0.2)

## 2022-01-18 MED ORDER — ASPIRIN EC 81 MG PO TBEC: 81.0000 mg | DELAYED_RELEASE_TABLET | Freq: Two times a day (BID) | ORAL | 2 refills | Status: AC

## 2022-01-18 MED ORDER — LORAZEPAM 0.5 MG PO TABS
0.5000 mg | ORAL_TABLET | Freq: Two times a day (BID) | ORAL | 0 refills | Status: DC | PRN
Start: 2022-01-18 — End: 2022-02-05

## 2022-01-18 MED ORDER — SENNOSIDES-DOCUSATE SODIUM 8.6-50 MG PO TABS: 2.0000 | ORAL_TABLET | Freq: Every day | ORAL | 11 refills | Status: DC

## 2022-01-18 MED ORDER — OXYCODONE-ACETAMINOPHEN 5-325 MG PO TABS: 1.0000 | ORAL_TABLET | ORAL | 0 refills | Status: DC | PRN

## 2022-01-18 MED ORDER — DULOXETINE HCL 30 MG PO CPEP: 30.0000 mg | ORAL_CAPSULE | Freq: Every day | ORAL | 3 refills | Status: DC

## 2022-01-18 MED ORDER — LORAZEPAM 0.5 MG PO TABS: 0.5000 mg | ORAL_TABLET | Freq: Two times a day (BID) | ORAL | 0 refills | Status: DC | PRN

## 2022-01-18 NOTE — Care Management Important Message (Signed)
Important Message  Patient Details  Name: Mallory Steele MRN: [id number] Date of Birth: 12/13/1931   Medicare Important Message Given:  Yes     Tommy Medal 01/18/2022, 11:32 AM

## 2022-01-18 NOTE — Progress Notes (Signed)
Report called to nurse Flo at [facility]

## 2022-01-18 NOTE — TOC Transition Note (Signed)
Transition of Care [facility]) - CM/SW Discharge Note   Patient Details  Name: Mallory Steele MRN: [id number] Date of Birth: [birth year]  Transition of [facility] CM/SW Contact:  Shade Flood, LCSW Phone Number: 01/18/2022, 1:08 PM   Clinical Narrative:     Pt stable for dc per MD. Marilynn Rail and PASRR number received earlier today. Bryson Ha at [facility] states pt can transfer today. DC clinical sent electronically. RN to call report. Pt will transport with EMS.  There are no other TOC needs for dc.  Final next level of care: Skilled Nursing Facility Barriers to Discharge: Barriers Resolved   Patient Goals and CMS Choice Patient states their goals for this hospitalization and ongoing recovery are:: rehab CMS Medicare.gov Compare Post Acute Care list provided to:: Patient Represenative (must comment) Choice offered to / list presented to : Adult Children  Discharge Placement PASRR number recieved: 01/18/22            Patient chooses bed at: [facility] Patient to be transferred to facility by: EMS Name of family member notified: Saloni Lablanc Patient and family notified of of transfer: 01/18/22  Discharge Plan and Services In-house Referral: Clinical Social Work   Post Acute Care Choice: [place]                               Social Determinants of Health (SDOH) Interventions     Readmission Risk Interventions No flowsheet data found.

## 2022-01-18 NOTE — Progress Notes (Signed)
OT Cancellation Note  Patient Details Name: Mallory Steele MRN: [id number] DOB: 05-08-1932   Cancelled Treatment:    Reason Eval/Treat Not Completed: Fatigue/lethargy limiting ability to participate. Attempted to see patient for OT evaluation this AM. Patient sleeping hard and not able to participate. Family member in room and expressed that patient had a rough day yesterday and she has been very confused in this environment. Patient may be discharging to SNF today. If she is still here tomorrow, OT will re-attempt evaluation.    Ailene Ravel, OTR/L,CBIS  [phone]  01/18/2022, 9:39 AM

## 2022-01-18 NOTE — Discharge Summary (Signed)
Mallory Steele, is a 88 y.o. female  DOB Feb 03, 1932  MRN [id number].  Admission date:  01/14/2022  Admitting Physician  Bernadette Hoit, DO  Discharge Date:  01/18/2022   Primary MD  Dettinger, Fransisca Kaufmann, MD  Recommendations for primary care physician for things to follow:   1)Weightbearing: WBAT LLE Insicional and dressing care: Reinforce dressings as needed VTE prophylaxis: Aspirin 81mg  BID  6 weeks; recommended.   Follow - up orthopedic Plan: 2 weeks for incision check.  If she goes to a facility, there are no sutures that need to be removed.  Schedule a follow up in 6 weeks.  Sutures ends should be trimmed and steri strips replaced around 2 weeks postop  2)Repeat CBC and Repeat BMP Blood Test on Friday March 3rd 2023  Admission Diagnosis  Fall [W19.XXXA] Closed left femoral fracture ([place]) [S72.92XA] Closed displaced intertrochanteric fracture of left femur, initial encounter [facility]) [S72.142A]   Discharge Diagnosis  Fall [W19.XXXA] Closed left femoral fracture ([facility]) [S72.92XA] Closed displaced intertrochanteric fracture of left femur, initial encounter ([place]) [S72.142A]    Principal Problem:   Closed left femoral fracture ([place]) Active Problems:   GERD without esophagitis   Fall   Hyperglycemia   Chronic kidney disease, stage 3b ([place])      Past Medical History:  Diagnosis Date   Anxiety    Dementia ([place])     Past Surgical History:  Procedure Laterality Date   APPENDECTOMY  1947   COLON SURGERY  [phone]   JOINT REPLACEMENT     knee replaced     HPI  from the history and physical done on the day of admission:   HPI: Mallory Steele is a 88 y.o. female with medical history significant of dementia, GERD, anxiety, RLS who presents to the emergency department via EMS due to a fall sustained at home.  Patient states that she has had about 3 falls within the last couple of days, with last  fall being today.  Patient states that she landed on her left side with subsequent pain on same side.  EMS was activated and patient was sent to the ED for further evaluation and management.  At baseline, patient states that she ambulates with a walker and she lives with daughter.  She denies chest pain, shortness of breath, headache, fever, chills, palpitations, nausea, vomiting, abdominal pain.  ED course: In the emergency department, patient presents with tachypnea, BP was 150/73, but other vital signs were within normal range.  Work-up in the ED showed normal CBC, BUN/creatinine was 15/1.31 (baseline creatinine at 1.2-1.3).  CBG 147. Several imaging studies including CT cervical spine and CT of head without contrast, CT thoracic spine and CT lumbar spine without contrast showed no acute fracture. Left femoral x-ray showed displaced comminuted intertrochanteric fracture of the left femoral. She was treated with IV fentanyl, IV hydration was provided.  Orthopedic surgeon (Dr. Amedeo Kinsman) was consulted and recommended admitting patient with plan to possibly take patient to the OR in the morning per ED PA.  Hospitalist was asked to admit patient for further evaluation and management.   Review of Systems: As mentioned in the history of present illness. All other systems reviewed and are negative.     Hospital Course:     Brief Narrative:    88 y.o. female with medical history significant of dementia, GERD, anxiety, RLS admitted on 01/14/22--with displaced comminuted intertrochanteric fracture of the left femur S/p ORIF on 01/15/22  Assessment and Plan:  1)Closed left femoral fracture ([place])-  -Orthopedic Left femoral x-ray showed displaced comminuted intertrochanteric fracture of the left femoral. -Orthopedic  surgery (Dr. Amedeo Kinsman) consult appreciated  -displaced comminuted intertrochanteric fracture of the left femur S/p ORIF/operative intervention on 01/15/2022 -Weightbearing: WBAT  LLE Insicional and dressing care: Reinforce dressings as needed VTE prophylaxis: Aspirin 81mg  BID  6 weeks; recommended.   Follow - up ortho plan: 2 weeks for incision check.  If she goes to a facility, there are no sutures that need to be removed.  We can schedule a follow up in 6 weeks.  Sutures ends should be trimmed and steri strips replaced around 2 weeks postop  -Physical therapy eval appreciated recommends SNF rehab - transfer to SNF rehab    Chronic kidney disease, stage 3b ([facility]) BUN/creatinine was 15/1.31 (baseline creatinine at 1.2-1.3). Continue gentle hydration -Creatinine currently stable around 1.2 Renally adjust medications, avoid nephrotoxic agents/dehydration/hypotension -Repeat BMP on Friday 01/22/2022   Hyperglycemia -this is possibly reactive -A1c 5.3 -Continue regular diet   Fall- (present on admission) Continue fall precautions    GERD without esophagitis/H/o Prior Gi Surgery with "Dumping Syndrome"-  - (present on admission) -Stable, continue PPI -May need nutritional supplements   Dementia--- at baseline patient has mild to moderate cognitive and memory deficits -Please be advised that the above patient has a primary diagnosis of dementia which supersedes any other psychiatric diagnosis.   -PTA she was on Cymbalta, trazodone,   and as needed lorazepam   RLS/Rheumatoid Arthritis--currently no longer on Requip    Social/Ethics--Has been on Hospice for > 6 months, she is a DNR -Advanced directive discussed with patient's daughter she confirms DNR status, RN [employer] and  LPN [employer] verified CODE STATUS with patient's daughter Mallory Steele at bedside   Acute anemia--- suspect related to acute blood loss in setting of hip fracture operative intervention -Hgb down up to 8.4 from 7.2, -after transfusion of 1 unit of PRBC on 01/17/2022 -repeat CBC on 01/22/22   Disposition --transfer to SNF rehab     Disposition: The patient is from: Home               Anticipated d/c is to: SNF                Code Status :  -  Code Status: DNR    Family Communication:    (patient is alert, awake and coherent) -Discussed with Daughter Mallory Steele--- [phone] Discussed with Niece Mallory Steele who recently moved here from [place]  Discharge Condition: stable  Follow UP   Contact information for after-discharge care     [facility] .   Service: Skilled Nursing Contact information: [address] [place] [phone]                      Consults obtained - ortho  Diet and Activity recommendation:  As advised  Discharge Instructions    Discharge Instructions  Call MD for:  difficulty breathing, headache or visual disturbances   Complete by: As directed    Call MD for:  persistant dizziness or light-headedness   Complete by: As directed    Call MD for:  persistant nausea and vomiting   Complete by: As directed    Call MD for:  severe uncontrolled pain   Complete by: As directed    Call MD for:  temperature >100.4   Complete by: As directed    Diet - low sodium heart healthy   Complete by: As directed    Discharge instructions   Complete by: As directed    1)Weightbearing: WBAT LLE Insicional and dressing care: Reinforce dressings as needed VTE prophylaxis: Aspirin 81mg  BID  6 weeks; recommended.   Follow - up orthopedic Plan: 2 weeks for incision check.  If she goes to a facility, there are no sutures that need to be removed.  Schedule a follow up in 6 weeks.  Sutures ends should be trimmed and steri strips replaced around 2 weeks postop  2)Repeat CBC and Repeat BMP Blood Test on Friday March 3rd 2023   Discharge wound care:   Complete by: As directed    Keep wound clean and dry   Increase activity slowly   Complete by: As directed          Discharge Medications     Allergies as of 01/18/2022   No Known Allergies       Medication List     STOP taking these medications    clobetasol cream 0.05 % Commonly known as: TEMOVATE   Depend Underwear Large Misc   diphenhydramine-acetaminophen 25-500 MG Tabs tablet Commonly known as: TYLENOL PM   donepezil 10 MG tablet Commonly known as: ARICEPT   HYDROcodone-acetaminophen 10-325 MG tablet Commonly known as: NORCO   meclizine 25 MG tablet Commonly known as: ANTIVERT   risperiDONE 2 MG tablet Commonly known as: RISPERDAL   rOPINIRole 0.25 MG tablet Commonly known as: REQUIP   zinc gluconate 50 MG tablet       TAKE these medications    aspirin EC 81 MG tablet Take 1 tablet (81 mg total) by mouth 2 (two) times daily with a meal.   DULoxetine 30 MG capsule Commonly known as: CYMBALTA Take 1 capsule (30 mg total) by mouth daily. Start taking on: January 19, 2022 What changed:  medication strength how much to take additional instructions   ferrous sulfate 325 (65 FE) MG tablet TAKE 1 TABLET BY MOUTH EVERY DAY WITH BREAKFAST What changed: See the new instructions.   LORazepam 0.5 MG tablet Commonly known as: Ativan Take 1 tablet (0.5 mg total) by mouth 2 (two) times daily as needed for anxiety or sleep. What changed:  reasons to take this Another medication with the same name was removed. Continue taking this medication, and follow the directions you see here.   Mucinex Fast-Max DM Max 5-100 MG/5ML Liqd Generic drug: Dextromethorphan-guaiFENesin Take 5 mLs by mouth every 4 (four) hours as needed (mucas releif).   omeprazole 20 MG capsule Commonly known as: PRILOSEC Take 1 capsule (20 mg total) by mouth daily. (NEEDS TO BE SEEN BEFORE NEXT REFILL)   oxyCODONE-acetaminophen 5-325 MG tablet Commonly known as: Percocet Take 1 tablet by mouth every 4 (four) hours as needed for severe pain.   senna-docusate 8.6-50 MG tablet Commonly known as: Senokot-S Take 2 tablets by mouth at bedtime.   traZODone 50 MG tablet Commonly known  as: DESYREL TAKE 1  TO 2 TABLETS BY MOUTH AT BEDTIME AS NEEDED FOR SLEEP What changed:  how much to take when to take this additional instructions   vitamin B-12 250 MCG tablet Commonly known as: CYANOCOBALAMIN TAKE 1 TABLET BY MOUTH EVERY DAY               Discharge Care Instructions  (From admission, onward)           Start     Ordered   01/18/22 0000  Discharge wound care:       Comments: Keep wound clean and dry   01/18/22 1253            Major procedures and Radiology Reports - PLEASE review detailed and final reports for all details, in brief -   DG Pelvis 1-2 Views  Result Date: 01/15/2022 CLINICAL DATA:  Postop EXAM: PELVIS - 1-2 VIEW COMPARISON:  01/14/2022 FINDINGS: Interval intramedullary rod and distal screw fixation of comminuted intertrochanteric fracture. Gas in the soft tissues consistent with recent surgery. IMPRESSION: Interval surgical fixation of comminuted intertrochanteric fracture on the left with expected surgical changes Electronically Signed   By: Donavan Foil M.D.   On: 01/15/2022 16:28   DG Pelvis 1-2 Views  Result Date: 01/14/2022 CLINICAL DATA:  Unwitnessed fall. EXAM: PELVIS - 1-2 VIEW COMPARISON:  None. FINDINGS: Redemonstration of the left intertrochanteric fracture with superior displacement of the distal femur. No other appreciable fracture or dislocation. Evaluation of sacrum is limited due to overlying bowel contents. Advanced degenerate disc disease of the lower lumbar spine. IMPRESSION: Left femoral intertrochanteric fracture. Advanced degenerate disc disease of the lumbar spine. Electronically Signed   By: Keane Police D.O.   On: 01/14/2022 17:41   CT Head Wo Contrast  Result Date: 01/14/2022 CLINICAL DATA:  Fall on back.  Unwitnessed fall. EXAM: CT HEAD WITHOUT CONTRAST CT CERVICAL SPINE WITHOUT CONTRAST TECHNIQUE: Multidetector CT imaging of the head and cervical spine was performed following the standard protocol without  intravenous contrast. Multiplanar CT image reconstructions of the cervical spine were also generated. RADIATION DOSE REDUCTION: This exam was performed according to the departmental dose-optimization program which includes automated exposure control, adjustment of the mA and/or kV according to patient size and/or use of iterative reconstruction technique. COMPARISON:  CT head dated December 27, 2021. FINDINGS: CT HEAD FINDINGS Brain: No evidence of acute infarction, hemorrhage, hydrocephalus, extra-axial collection or mass lesion/mass effect. Moderate microvascular ischemic changes of the white matter and mild cerebral volume loss, unchanged. Vascular: No hyperdense vessel or unexpected calcification. Skull: Normal. Negative for fracture or focal lesion. Sinuses/Orbits: No acute finding.  Right cataract surgery. Other: None. CT CERVICAL SPINE FINDINGS Alignment: Anterolisthesis of C3 and C7.  Mild retrolisthesis of C4. Skull base and vertebrae: No acute fracture. No primary bone lesion or focal pathologic process. Soft tissues and spinal canal: No prevertebral fluid or swelling. No visible canal hematoma. Disc levels: Advanced multilevel degenerate disc disease with near complete loss of disc spaces at C4-C5, C5-C6 and C6-C7. Prominent osteophytes. Multilevel uncovertebral joint arthropathy. C2-C3:  No significant findings. C3-C4: Disc osteophyte complex with mild left neural foraminal narrowing. C4-C5: Near complete loss of the disc space with prominent anterior and posterior osteophytes. Focal central canal narrowing. Mild left neural foraminal narrowing. C5-C6: Uncovertebral joint hypertrophy with mild bilateral neural foraminal narrowing. C6-C7: Disc osteophyte complex with mild right neural foraminal narrowing. C7-T1:  No significant finding. Upper chest: Negative. Other: None IMPRESSION: 1.  No acute intracranial abnormality. 2. Mild cerebral volume  loss and moderate chronic microvascular ischemic changes of  the white matter, unchanged. 3.  No evidence of acute fracture. 4. Advanced multilevel degenerate disc disease most prominent at C4-C5, C5-C6 and C6-C7. Electronically Signed   By: Keane Police D.O.   On: 01/14/2022 17:55   CT Cervical Spine Wo Contrast  Result Date: 01/14/2022 CLINICAL DATA:  Fall on back.  Unwitnessed fall. EXAM: CT HEAD WITHOUT CONTRAST CT CERVICAL SPINE WITHOUT CONTRAST TECHNIQUE: Multidetector CT imaging of the head and cervical spine was performed following the standard protocol without intravenous contrast. Multiplanar CT image reconstructions of the cervical spine were also generated. RADIATION DOSE REDUCTION: This exam was performed according to the departmental dose-optimization program which includes automated exposure control, adjustment of the mA and/or kV according to patient size and/or use of iterative reconstruction technique. COMPARISON:  CT head dated December 27, 2021. FINDINGS: CT HEAD FINDINGS Brain: No evidence of acute infarction, hemorrhage, hydrocephalus, extra-axial collection or mass lesion/mass effect. Moderate microvascular ischemic changes of the white matter and mild cerebral volume loss, unchanged. Vascular: No hyperdense vessel or unexpected calcification. Skull: Normal. Negative for fracture or focal lesion. Sinuses/Orbits: No acute finding.  Right cataract surgery. Other: None. CT CERVICAL SPINE FINDINGS Alignment: Anterolisthesis of C3 and C7.  Mild retrolisthesis of C4. Skull base and vertebrae: No acute fracture. No primary bone lesion or focal pathologic process. Soft tissues and spinal canal: No prevertebral fluid or swelling. No visible canal hematoma. Disc levels: Advanced multilevel degenerate disc disease with near complete loss of disc spaces at C4-C5, C5-C6 and C6-C7. Prominent osteophytes. Multilevel uncovertebral joint arthropathy. C2-C3:  No significant findings. C3-C4: Disc osteophyte complex with mild left neural foraminal narrowing. C4-C5: Near  complete loss of the disc space with prominent anterior and posterior osteophytes. Focal central canal narrowing. Mild left neural foraminal narrowing. C5-C6: Uncovertebral joint hypertrophy with mild bilateral neural foraminal narrowing. C6-C7: Disc osteophyte complex with mild right neural foraminal narrowing. C7-T1:  No significant finding. Upper chest: Negative. Other: None IMPRESSION: 1.  No acute intracranial abnormality. 2. Mild cerebral volume loss and moderate chronic microvascular ischemic changes of the white matter, unchanged. 3.  No evidence of acute fracture. 4. Advanced multilevel degenerate disc disease most prominent at C4-C5, C5-C6 and C6-C7. Electronically Signed   By: Keane Police D.O.   On: 01/14/2022 17:55   CT Thoracic Spine Wo Contrast  Result Date: 01/14/2022 CLINICAL DATA:  [place].  Back pain.  Left hip fracture. EXAM: CT THORACIC AND LUMBAR SPINE WITHOUT CONTRAST TECHNIQUE: Multidetector CT imaging of the thoracic and lumbar spine was performed without contrast. Multiplanar CT image reconstructions were also generated. RADIATION DOSE REDUCTION: This exam was performed according to the departmental dose-optimization program which includes automated exposure control, adjustment of the mA and/or kV according to patient size and/or use of iterative reconstruction technique. COMPARISON:  None. FINDINGS: CT THORACIC SPINE FINDINGS Alignment: Normal Vertebrae: Advanced degenerative changes and osteoporosis no acute fracture. The facets are normally aligned. No facet or laminar fractures. The posterior ribs are intact. Paraspinal and other soft tissues: No significant paraspinal findings. No lung lesions or mediastinal hematoma. Disc levels: The spinal canal is fairly generous. No canal stenosis. CT LUMBAR SPINE FINDINGS Segmentation: There are five lumbar type vertebral bodies. The last full intervertebral disc space is labeled L5-S1. Alignment: Severe degenerative lumbar spondylosis and  scoliosis. Vertebrae: No acute fracture is identified. Extensive discogenic sclerosis. Paraspinal and other soft tissues: No significant paraspinal findings. Disc levels: Severe degenerative lumbar spondylosis with  multilevel disc disease and facet disease. Mild multilevel lateral recess and foraminal stenosis. No significant canal stenosis. IMPRESSION: 1. Advanced degenerative changes and osteoporosis but no acute fracture of the thoracic or lumbar spine. 2. Severe degenerative lumbar spondylosis and scoliosis with multilevel disc disease and facet disease. 3. No significant canal stenosis. Electronically Signed   By: Marijo Sanes M.D.   On: 01/14/2022 17:52   CT Lumbar Spine Wo Contrast  Result Date: 01/14/2022 CLINICAL DATA:  [place].  Back pain.  Left hip fracture. EXAM: CT THORACIC AND LUMBAR SPINE WITHOUT CONTRAST TECHNIQUE: Multidetector CT imaging of the thoracic and lumbar spine was performed without contrast. Multiplanar CT image reconstructions were also generated. RADIATION DOSE REDUCTION: This exam was performed according to the departmental dose-optimization program which includes automated exposure control, adjustment of the mA and/or kV according to patient size and/or use of iterative reconstruction technique. COMPARISON:  None. FINDINGS: CT THORACIC SPINE FINDINGS Alignment: Normal Vertebrae: Advanced degenerative changes and osteoporosis no acute fracture. The facets are normally aligned. No facet or laminar fractures. The posterior ribs are intact. Paraspinal and other soft tissues: No significant paraspinal findings. No lung lesions or mediastinal hematoma. Disc levels: The spinal canal is fairly generous. No canal stenosis. CT LUMBAR SPINE FINDINGS Segmentation: There are five lumbar type vertebral bodies. The last full intervertebral disc space is labeled L5-S1. Alignment: Severe degenerative lumbar spondylosis and scoliosis. Vertebrae: No acute fracture is identified. Extensive discogenic  sclerosis. Paraspinal and other soft tissues: No significant paraspinal findings. Disc levels: Severe degenerative lumbar spondylosis with multilevel disc disease and facet disease. Mild multilevel lateral recess and foraminal stenosis. No significant canal stenosis. IMPRESSION: 1. Advanced degenerative changes and osteoporosis but no acute fracture of the thoracic or lumbar spine. 2. Severe degenerative lumbar spondylosis and scoliosis with multilevel disc disease and facet disease. 3. No significant canal stenosis. Electronically Signed   By: Marijo Sanes M.D.   On: 01/14/2022 17:52   DG C-Arm 1-60 Min-No Report  Result Date: 01/15/2022 Fluoroscopy was utilized by the requesting physician.  No radiographic interpretation.   DG HIP UNILAT WITH PELVIS 2-3 VIEWS LEFT  Result Date: 01/15/2022 CLINICAL DATA:  Left femoral fracture EXAM: DG HIP (WITH OR WITHOUT PELVIS) 2-3V LEFT COMPARISON:  None. FINDINGS: Multiple intraoperative fluoroscopic spot images are provided. Left intertrochanteric fracture transfixed with a intramedullary nail and interlocking screw in anatomic alignment. FLUOROSCOPY TIME:  Fluoroscopy Time:  1 minute 51 seconds Radiation Exposure Index (if provided by the fluoroscopic device): 31.79 mGy Number of Acquired Spot Images: 0 IMPRESSION: 1. Left intertrochanteric fracture transfixed with a intramedullary nail and interlocking screw in anatomic alignment. Electronically Signed   By: Kathreen Devoid M.D.   On: 01/15/2022 15:53   DG FEMUR MIN 2 VIEWS LEFT  Result Date: 01/15/2022 CLINICAL DATA:  Postop intertrochanteric fracture left femur. EXAM: LEFT FEMUR 2 VIEWS COMPARISON:  Left femur radiographs 01/14/2022 FINDINGS: Interval intramedullary nail fixation with oblique femoral head-neck interlocking screw traversing the previously seen comminuted fracture of the proximal left intertrochanteric region. Improved alignment. No perihardware lucency is seen to indicate hardware failure or  loosening. Expected postoperative changes including lateral proximal thigh soft tissue swelling and subcutaneous air. Moderate superior left femoroacetabular joint space narrowing. Total left knee arthroplasty hardware is again noted. IMPRESSION: Interval ORIF of previously seen left proximal femoral intertrochanteric comminuted fracture with improved now near anatomic alignment. Electronically Signed   By: Yvonne Kendall M.D.   On: 01/15/2022 16:30   DG Femur Min 2  Views Left  Result Date: 01/14/2022 CLINICAL DATA:  Fall, deformity EXAM: LEFT FEMUR 2 VIEWS COMPARISON:  None. FINDINGS: There is a displaced comminuted intertrochanteric fracture of the left femur. Distal femur is intact. Left knee arthroplasty. IMPRESSION: Displaced comminuted intertrochanteric fracture of the left femur. Electronically Signed   By: Keane Police D.O.   On: 01/14/2022 17:39    Micro Results  Recent Results (from the past 240 hour(s))  Resp Panel by RT-PCR (Flu A&B, Covid) Nasopharyngeal Swab     Status: None   Collection Time: 01/14/22  7:26 PM   Specimen: Nasopharyngeal Swab; Nasopharyngeal(NP) swabs in vial transport medium  Result Value Ref Range Status   SARS Coronavirus 2 by RT PCR NEGATIVE NEGATIVE Final    Comment: (NOTE) SARS-CoV-2 target nucleic acids are NOT DETECTED.  The SARS-CoV-2 RNA is generally detectable in upper respiratory specimens during the acute phase of infection. The lowest concentration of SARS-CoV-2 viral copies this assay can detect is 138 copies/mL. A negative result does not preclude SARS-Cov-2 infection and should not be used as the sole basis for treatment or other patient management decisions. A negative result may occur with  improper specimen collection/handling, submission of specimen other than nasopharyngeal swab, presence of viral mutation(s) within the areas targeted by this assay, and inadequate number of viral copies(<138 copies/mL). A negative result must be combined  with clinical observations, patient history, and epidemiological information. The expected result is Negative.  Fact Sheet for Patients:  EntrepreneurPulse.com.au  Fact Sheet for Healthcare Providers:  IncredibleEmployment.be  This test is no t yet approved or cleared by the [place] FDA and  has been authorized for detection and/or diagnosis of SARS-CoV-2 by FDA under an Emergency Use Authorization (EUA). This EUA will remain  in effect (meaning this test can be used) for the duration of the COVID-19 declaration under Section 564(b)(1) of the Act, 21 U.S.C.section 360bbb-3(b)(1), unless the authorization is terminated  or revoked sooner.       Influenza A by PCR NEGATIVE NEGATIVE Final   Influenza B by PCR NEGATIVE NEGATIVE Final    Comment: (NOTE) The Xpert Xpress SARS-CoV-2/FLU/RSV plus assay is intended as an aid in the diagnosis of influenza from Nasopharyngeal swab specimens and should not be used as a sole basis for treatment. Nasal washings and aspirates are unacceptable for Xpert Xpress SARS-CoV-2/FLU/RSV testing.  Fact Sheet for Patients: EntrepreneurPulse.com.au  Fact Sheet for Healthcare Providers: IncredibleEmployment.be  This test is not yet approved or cleared by the [place] FDA and has been authorized for detection and/or diagnosis of SARS-CoV-2 by FDA under an Emergency Use Authorization (EUA). This EUA will remain in effect (meaning this test can be used) for the duration of the COVID-19 declaration under Section 564(b)(1) of the Act, 21 U.S.C. section 360bbb-3(b)(1), unless the authorization is terminated or revoked.  Performed at [facility], [address] [place], [place] [address]   Surgical pcr screen     Status: None   Collection Time: 01/15/22 12:52 AM   Specimen: Nasal Mucosa; Nasal Swab  Result Value Ref Range Status   MRSA, PCR NEGATIVE NEGATIVE Final    Staphylococcus aureus NEGATIVE NEGATIVE Final    Comment: (NOTE) The Xpert SA Assay (FDA approved for NASAL specimens in patients 49 years of age and older), is one component of a comprehensive surveillance program. It is not intended to diagnose infection nor to guide or monitor treatment. Performed at [facility], [address] [place], [place] [address]  Today   Subjective    Elaynah Winiarski today has no new complaints -Resting, somewhat sleepy, daughter Mallory Steele is at bedside, questions answered          Patient has been seen and examined prior to discharge   Objective   Blood pressure (!) 106/38, pulse 69, temperature 99.9 F (37.7 C), resp. rate 18, height 5\' 4"  (1.626 m), weight 68.7 kg, SpO2 99 %.   Intake/Output Summary (Last 24 hours) at 01/18/2022 1254 Last data filed at 01/18/2022 0700 Gross per 24 hour  Intake 1254.68 ml  Output 1700 ml  Net -445.32 ml    Exam Gen:-Somewhat sleepy today, pleasantly confused, cooperative HEENT:- [place].AT, No sclera icterus Neck-Supple Neck,No JVD,.  Lungs-  CTAB , fair symmetrical air movement CV- S1, S2 normal, regular  Abd-  +ve B.Sounds, Abd Soft, No tenderness,    Extremity/Skin:- No  edema, pedal pulses present  Psych-underlying/baseline memory and cognitive deficits Neuro-generalized weakness, no new focal deficits, no tremors MSK-left hip wound clean dry and intact   Data Review   CBC w Diff:  Lab Results  Component Value Date   WBC 7.8 01/18/2022   HGB 8.4 (L) 01/18/2022   HGB 12.7 07/11/2020   HCT 26.3 (L) 01/18/2022   HCT 38.4 07/11/2020   PLT 116 (L) 01/18/2022   PLT 148 (L) 07/11/2020   LYMPHOPCT 17 01/14/2022   MONOPCT 7 01/14/2022   EOSPCT 0 01/14/2022   BASOPCT 0 01/14/2022    CMP:  Lab Results  Component Value Date   NA 135 01/17/2022   NA 141 07/11/2020   K 3.7 01/17/2022   CL 101 01/17/2022   CO2 28 01/17/2022   BUN 15 01/17/2022   BUN 15 07/11/2020   CREATININE 1.21 (H) 01/17/2022    PROT 6.6 01/15/2022   PROT 6.5 07/11/2020   ALBUMIN 3.7 01/15/2022   ALBUMIN 4.2 07/11/2020   BILITOT 0.9 01/15/2022   BILITOT 0.2 07/11/2020   ALKPHOS 123 01/15/2022   AST 23 01/15/2022   ALT 13 01/15/2022  .  Total Discharge time is about 33 minutes  Roxan Hockey M.D on 01/18/2022 at 12:54 PM  Go to [email or web address] -  for contact info  [facility] - Office  [phone]

## 2022-01-18 NOTE — Discharge Instructions (Signed)
1)Weightbearing: WBAT LLE Insicional and dressing care: Reinforce dressings as needed VTE prophylaxis: Aspirin 81mg  BID  6 weeks; recommended.   Follow - up orthopedic Plan: 2 weeks for incision check.  If she goes to a facility, there are no sutures that need to be removed.  Schedule a follow up in 6 weeks.  Sutures ends should be trimmed and steri strips replaced around 2 weeks postop  2)Repeat CBC and Repeat BMP Blood Test on Friday March 3rd 2023

## 2022-01-19 ENCOUNTER — Encounter (HOSPITAL_COMMUNITY): Payer: Self-pay | Admitting: Orthopedic Surgery

## 2022-01-20 DIAGNOSIS — R339 Retention of urine, unspecified: Secondary | ICD-10-CM | POA: Diagnosis not present

## 2022-01-20 DIAGNOSIS — K219 Gastro-esophageal reflux disease without esophagitis: Secondary | ICD-10-CM | POA: Diagnosis not present

## 2022-01-20 DIAGNOSIS — F419 Anxiety disorder, unspecified: Secondary | ICD-10-CM | POA: Diagnosis not present

## 2022-01-20 DIAGNOSIS — K5900 Constipation, unspecified: Secondary | ICD-10-CM | POA: Diagnosis not present

## 2022-01-25 DIAGNOSIS — I10 Essential (primary) hypertension: Secondary | ICD-10-CM | POA: Diagnosis not present

## 2022-01-29 ENCOUNTER — Ambulatory Visit (INDEPENDENT_AMBULATORY_CARE_PROVIDER_SITE_OTHER): Payer: Medicare HMO | Admitting: Orthopedic Surgery

## 2022-01-29 ENCOUNTER — Encounter: Payer: Self-pay | Admitting: Orthopedic Surgery

## 2022-01-29 ENCOUNTER — Ambulatory Visit: Payer: Medicare HMO

## 2022-01-29 ENCOUNTER — Other Ambulatory Visit: Payer: Self-pay

## 2022-01-29 DIAGNOSIS — S42294D Other nondisplaced fracture of upper end of right humerus, subsequent encounter for fracture with routine healing: Secondary | ICD-10-CM

## 2022-01-29 DIAGNOSIS — S72142D Displaced intertrochanteric fracture of left femur, subsequent encounter for closed fracture with routine healing: Secondary | ICD-10-CM

## 2022-01-29 NOTE — Progress Notes (Signed)
Orthopaedic Postop Note ? ?Assessment: ?Mallory Steele is a 88 y.o. female s/p cephalomedullary nail for a left intertrochanteric femur fracture ? ?DOS: 01/15/2022 ? ?Plan: ?Sutures were trimmed, Steri-Strips were placed. ?Radiographs were reviewed.  There has been slight interval subsidence of the fracture, with a little bit of migration of the hardware.  The patient and family have been updated.  We will continue to monitor closely.  Recommend ambulation with the use of a walker at all times.  Anticipate gradual improvement in her symptoms overall.  As the fracture heals, expect pain to get better also.  All questions were answered.  The patient's daughter is planning to bring her home from the nursing facility over the weekend.  Follow-up in approximately 4 weeks. ? ? ? ?Follow-up: ?Return in about 4 weeks (around 02/26/2022). ?XR at next visit: AP pelvis, left femur ? ?Subjective: ? ?Chief Complaint  ?Patient presents with  ? Routine Post Op  ?  DOS 01/15/22  ?Comminuted left intertrochanteric femur fracture  ? ? ?History of Present Illness: ?Mallory Steele is a 88 y.o. female who presents following the above stated procedure.  Surgery was approximately 2 weeks ago.  She has done well in follow-up.  She continues to have pain in the left hip.  She is working with physical therapy.  She was discharged to a nursing facility.  Family plans to bring her home over the weekend.  No issues with her surgical incisions.  No fevers or chills. ? ?Review of Systems: ?No fevers or chills ?No numbness or tingling ?No Chest Pain ?No shortness of breath ? ? ?Objective: ?There were no vitals taken for this visit. ? ?Physical Exam: ? ?Alert and oriented.  No acute distress. ? ?Seated in a wheelchair. ? ?She is able to maintain a straight leg raise.  She has some pain with gentle range of motion of her left hip.  Mild discomfort with axial loading.  Tenderness along the incisions laterally.  Toes are warm and well-perfused.  Sensation  is intact over the dorsum of her foot. ? ?IMAGING: ?I personally ordered and reviewed the following images: ? ?AP pelvis and left femur x-rays were obtained in clinic today.  There is been no evidence of hardware failure or loosening.  There has been some mild subsidence of the fracture, with a little bit of proximal migration within the femoral head.  Lag screw remains completely contained.  No backing out of the lag screw.  No acute injuries are noted. ? ?Impression: Left cephalomedullary nail in stable position, with mild varus subsidence through the fracture site. ? ? ?Mordecai Rasmussen, MD ?01/29/2022 ?4:13 PM ? ? ?

## 2022-02-03 ENCOUNTER — Ambulatory Visit: Payer: Medicare Other | Admitting: Family Medicine

## 2022-02-04 DIAGNOSIS — I10 Essential (primary) hypertension: Secondary | ICD-10-CM | POA: Diagnosis not present

## 2022-02-04 DIAGNOSIS — C8339 Diffuse large B-cell lymphoma, extranodal and solid organ sites: Secondary | ICD-10-CM | POA: Diagnosis not present

## 2022-02-04 DIAGNOSIS — N390 Urinary tract infection, site not specified: Secondary | ICD-10-CM | POA: Diagnosis not present

## 2022-02-04 DIAGNOSIS — G40909 Epilepsy, unspecified, not intractable, without status epilepticus: Secondary | ICD-10-CM | POA: Diagnosis not present

## 2022-02-05 ENCOUNTER — Encounter: Payer: Self-pay | Admitting: Family Medicine

## 2022-02-05 ENCOUNTER — Ambulatory Visit (INDEPENDENT_AMBULATORY_CARE_PROVIDER_SITE_OTHER): Payer: Medicare HMO | Admitting: Family Medicine

## 2022-02-05 VITALS — BP 118/63 | HR 65 | Ht 64.0 in | Wt 147.0 lb

## 2022-02-05 DIAGNOSIS — K219 Gastro-esophageal reflux disease without esophagitis: Secondary | ICD-10-CM

## 2022-02-05 DIAGNOSIS — S72142D Displaced intertrochanteric fracture of left femur, subsequent encounter for closed fracture with routine healing: Secondary | ICD-10-CM

## 2022-02-05 DIAGNOSIS — S72142S Displaced intertrochanteric fracture of left femur, sequela: Secondary | ICD-10-CM

## 2022-02-05 DIAGNOSIS — F419 Anxiety disorder, unspecified: Secondary | ICD-10-CM

## 2022-02-05 DIAGNOSIS — F03918 Unspecified dementia, unspecified severity, with other behavioral disturbance: Secondary | ICD-10-CM | POA: Diagnosis not present

## 2022-02-05 MED ORDER — TRAZODONE HCL 50 MG PO TABS: 50.0000 mg | ORAL_TABLET | Freq: Every evening | ORAL | 3 refills | Status: AC | PRN

## 2022-02-05 MED ORDER — OMEPRAZOLE 20 MG PO CPDR: 20.0000 mg | DELAYED_RELEASE_CAPSULE | Freq: Every day | ORAL | 3 refills | Status: AC

## 2022-02-05 MED ORDER — OXYCODONE-ACETAMINOPHEN 5-325 MG PO TABS: 1.0000 | ORAL_TABLET | Freq: Two times a day (BID) | ORAL | 0 refills | Status: AC | PRN

## 2022-02-05 MED ORDER — DULOXETINE HCL 30 MG PO CPEP: 30.0000 mg | ORAL_CAPSULE | Freq: Every day | ORAL | 3 refills | Status: AC

## 2022-02-05 NOTE — Addendum Note (Signed)
Addended by: Alphonzo Dublin on: 02/05/2022 11:36 AM ? ? Modules accepted: Orders ? ?

## 2022-02-05 NOTE — Progress Notes (Signed)
? ?BP 118/63   Pulse 65   Ht '5\' 4"'$  (1.626 m)   Wt 147 lb (66.7 kg)   SpO2 100%   BMI 25.23 kg/m?   ? ?Subjective:  ? ?Patient ID: Mallory Steele, female    DOB: May 06, 1932, 88 y.o.   MRN: [id number] ? ?HPI: ?Mallory Steele is a 88 y.o. female presenting on 02/05/2022 for [place] Follow Up ? ? ?HPI ?Patient is coming out of [facility] for closed fracture of left radius and left femur.  They did do surgery in operating repair and then she was in a rehab facility just over a month doing rehab.  She is here with her caretaker/daughter who says that she is still having problems ambulating and has a lot of hip pain that is coming out.  She does get out of the rehab today because it was not going to be covered by insurance any further and was going to start $6000 a day.  Home physical therapy has been ordered rehab facility.  They said that she has been having a lot of pain they have actually backed off of lorazepam but were instructed that they can only do take it with the interaction between the 2.  She is having some acid reflux and a little bit of complaints of a globus sensation in the back of her throat but has been off of her antacid medicine as well.  She did lose weight in the nursing home because she was not liking their food.  Her daughter is going to be with her 24/7 and caretaking.  After leaving she does take some oxycodone or Percocet to help with pain and also takes Cymbalta to help with her mood and pain and takes trazodone at night.  They mostly stopped all of her other medicines. ? ?Relevant past medical, surgical, family and social history reviewed and updated as indicated. Interim medical history since our last visit reviewed. ?Allergies and medications reviewed and updated. ? ?Review of Systems  ?Constitutional:  Negative for chills and fever.  ?Eyes:  Negative for visual disturbance.  ?Respiratory:  Negative for chest tightness and shortness of breath.   ?Cardiovascular:   Negative for chest pain and leg swelling.  ?Musculoskeletal:  Positive for arthralgias and gait problem (Wheelchair-bound today in the office). Negative for back pain.  ?Skin:  Negative for rash.  ?Neurological:  Negative for light-headedness and headaches.  ?Psychiatric/Behavioral:  Negative for agitation and behavioral problems.   ?All other systems reviewed and are negative. ? ?Per HPI unless specifically indicated above ? ? ?Allergies as of 02/05/2022   ?No Known Allergies ?  ? ?  ?Medication List  ?  ? ?  ? Accurate as of February 05, 2022 11:28 AM. If you have any questions, ask your nurse or doctor.  ?  ?  ? ?  ? ?STOP taking these medications   ? ?ferrous sulfate 325 (65 FE) MG tablet ?Stopped by: Worthy Rancher, MD ?  ?LORazepam 0.5 MG tablet ?Commonly known as: Ativan ?Stopped by: Worthy Rancher, MD ?  ?senna-docusate 8.6-50 MG tablet ?Commonly known as: Senokot-S ?Stopped by: Worthy Rancher, MD ?  ? ?  ? ?TAKE these medications   ? ?aspirin EC 81 MG tablet ?Take 1 tablet (81 mg total) by mouth 2 (two) times daily with a meal. ?  ?DULoxetine 30 MG capsule ?Commonly known as: CYMBALTA ?Take 1 capsule (30 mg total) by mouth daily. ?  ?Mucinex Fast-Max  DM Max 5-100 MG/5ML Liqd ?Generic drug: Dextromethorphan-guaiFENesin ?Take 5 mLs by mouth every 4 (four) hours as needed (mucas releif). ?  ?omeprazole 20 MG capsule ?Commonly known as: PRILOSEC ?Take 1 capsule (20 mg total) by mouth daily. ?What changed: additional instructions ?Changed by: Worthy Rancher, MD ?  ?oxyCODONE-acetaminophen 5-325 MG tablet ?Commonly known as: Percocet ?Take 1 tablet by mouth 2 (two) times daily as needed for severe pain. ?What changed: when to take this ?Changed by: Worthy Rancher, MD ?  ?oxyCODONE-acetaminophen 5-325 MG tablet ?Commonly known as: Percocet ?Take 1 tablet by mouth 2 (two) times daily as needed for severe pain. ?Start taking on: March 07, 2022 ?What changed: You were already taking a medication with  the same name, and this prescription was added. Make sure you understand how and when to take each. ?Changed by: Worthy Rancher, MD ?  ?traZODone 50 MG tablet ?Commonly known as: DESYREL ?Take 1 tablet (50 mg total) by mouth at bedtime as needed. for sleep ?What changed: how much to take ?Changed by: Worthy Rancher, MD ?  ?vitamin B-12 250 MCG tablet ?Commonly known as: CYANOCOBALAMIN ?TAKE 1 TABLET BY MOUTH EVERY DAY ?  ? ?  ? ?  ?  ? ? ?  ?Durable Medical Equipment  ?(From admission, onward)  ?  ? ? ?  ? ?  Start     Ordered  ? 02/05/22 0000  For home use only DME Other see comment       ?Comments: Diagnosis left femoral fracture ?Bedside commode  ?Question:  Length of Need  Answer:  12 Months  ? 02/05/22 1122  ? ?  ?  ? ?  ? ? ? ?Objective:  ? ?BP 118/63   Pulse 65   Ht '5\' 4"'$  (1.626 m)   Wt 147 lb (66.7 kg)   SpO2 100%   BMI 25.23 kg/m?   ?Wt Readings from Last 3 Encounters:  ?02/05/22 147 lb (66.7 kg)  ?01/15/22 151 lb 7.3 oz (68.7 kg)  ?07/14/21 148 lb (67.1 kg)  ?  ?Physical Exam ?Vitals and nursing note reviewed.  ?Constitutional:   ?   General: She is not in acute distress. ?   Appearance: She is well-developed. She is not diaphoretic.  ?Eyes:  ?   Conjunctiva/sclera: Conjunctivae normal.  ?Cardiovascular:  ?   Rate and Rhythm: Normal rate and regular rhythm.  ?   Heart sounds: Normal heart sounds. No murmur heard. ?Pulmonary:  ?   Effort: Pulmonary effort is normal. No respiratory distress.  ?   Breath sounds: Normal breath sounds. No wheezing.  ?Musculoskeletal:     ?   General: Normal range of motion.  ?   Right hip: Tenderness (Left lateral hip pain) present.  ?Skin: ?   General: Skin is warm and dry.  ?   Findings: No rash.  ?Neurological:  ?   Mental Status: She is alert and oriented to person, place, and time.  ?   Coordination: Coordination normal.  ?Psychiatric:     ?   Behavior: Behavior normal.  ? ? ?Patient seems to be doing okay but mostly wheelchair-bound at this point, did order  some pain medicine and instructed her that she can no longer take lorazepam because of this interaction.  Order to DME for bedside commode. ? ?Assessment & Plan:  ? ?Problem List Items Addressed This Visit   ? ?  ? Digestive  ? GERD without esophagitis  ? Relevant Medications  ?  omeprazole (PRILOSEC) 20 MG capsule  ?  ? Nervous and Auditory  ? Dementia with behavioral disturbance  ? Relevant Medications  ? DULoxetine (CYMBALTA) 30 MG capsule  ? traZODone (DESYREL) 50 MG tablet  ?  ? Musculoskeletal and Integument  ? Closed left femoral fracture ([place]) - Primary  ? Relevant Medications  ? oxyCODONE-acetaminophen (PERCOCET) 5-325 MG tablet  ? oxyCODONE-acetaminophen (PERCOCET) 5-325 MG tablet (Start on 03/07/2022)  ? Other Relevant Orders  ? For home use only DME Other see comment  ?  ? Other  ? Anxiety  ? Relevant Medications  ? DULoxetine (CYMBALTA) 30 MG capsule  ? traZODone (DESYREL) 50 MG tablet  ?  ?  We will refill the oxycodone and instructed that they can only take the trazodone and follow-up in a couple months if they need for refills. ?Follow up plan: ?Return in about 2 months (around 04/07/2022), or if symptoms worsen or fail to improve, for Pain and left femoral fracture.. ? ?Counseling provided for all of the vaccine components ?Orders Placed This Encounter  ?Procedures  ? For home use only DME Other see comment  ? ? ?Caryl Pina, MD ?[place] ?02/05/2022, 11:28 AM ? ? ?  ?

## 2022-02-06 LAB — CBC WITH DIFFERENTIAL/PLATELET
Basophils Absolute: 0 10*3/uL (ref 0.0–0.2)
Basos: 0 %
EOS (ABSOLUTE): 0 10*3/uL (ref 0.0–0.4)
Eos: 0 %
Hematocrit: 37.1 % (ref 34.0–46.6)
Hemoglobin: 11.9 g/dL (ref 11.1–15.9)
Immature Grans (Abs): 0 10*3/uL (ref 0.0–0.1)
Immature Granulocytes: 1 %
Lymphocytes Absolute: 2.1 10*3/uL (ref 0.7–3.1)
Lymphs: 35 %
MCH: 30.3 pg (ref 26.6–33.0)
MCHC: 32.1 g/dL (ref 31.5–35.7)
MCV: 94 fL (ref 79–97)
Monocytes Absolute: 0.6 10*3/uL (ref 0.1–0.9)
Monocytes: 10 %
Neutrophils Absolute: 3.2 10*3/uL (ref 1.4–7.0)
Neutrophils: 54 %
Platelets: 323 10*3/uL (ref 150–450)
RBC: 3.93 x10E6/uL (ref 3.77–5.28)
RDW: 14 % (ref 11.7–15.4)
WBC: 6 10*3/uL (ref 3.4–10.8)

## 2022-02-06 LAB — CMP14+EGFR
ALT: 8 IU/L (ref 0–32)
AST: 18 IU/L (ref 0–40)
Albumin/Globulin Ratio: 1.5 (ref 1.2–2.2)
Albumin: 4 g/dL (ref 3.6–4.6)
Alkaline Phosphatase: 261 IU/L — ABNORMAL HIGH (ref 44–121)
BUN/Creatinine Ratio: 9 — ABNORMAL LOW (ref 12–28)
BUN: 10 mg/dL (ref 8–27)
Bilirubin Total: 0.8 mg/dL (ref 0.0–1.2)
CO2: 18 mmol/L — ABNORMAL LOW (ref 20–29)
Calcium: 8.9 mg/dL (ref 8.7–10.3)
Chloride: 108 mmol/L — ABNORMAL HIGH (ref 96–106)
Creatinine, Ser: 1.09 mg/dL — ABNORMAL HIGH (ref 0.57–1.00)
Globulin, Total: 2.6 g/dL (ref 1.5–4.5)
Glucose: 88 mg/dL (ref 70–99)
Potassium: 4.5 mmol/L (ref 3.5–5.2)
Sodium: 139 mmol/L (ref 134–144)
Total Protein: 6.6 g/dL (ref 6.0–8.5)
eGFR: 49 mL/min/{1.73_m2} — ABNORMAL LOW (ref >59)

## 2022-02-11 ENCOUNTER — Other Ambulatory Visit: Payer: Self-pay

## 2022-02-11 ENCOUNTER — Ambulatory Visit (INDEPENDENT_AMBULATORY_CARE_PROVIDER_SITE_OTHER): Payer: Medicare HMO

## 2022-02-11 DIAGNOSIS — M47812 Spondylosis without myelopathy or radiculopathy, cervical region: Secondary | ICD-10-CM

## 2022-02-11 DIAGNOSIS — S72142D Displaced intertrochanteric fracture of left femur, subsequent encounter for closed fracture with routine healing: Secondary | ICD-10-CM

## 2022-02-11 DIAGNOSIS — R32 Unspecified urinary incontinence: Secondary | ICD-10-CM

## 2022-02-11 DIAGNOSIS — N183 Chronic kidney disease, stage 3 unspecified: Secondary | ICD-10-CM

## 2022-02-11 DIAGNOSIS — F0394 Unspecified dementia, unspecified severity, with anxiety: Secondary | ICD-10-CM

## 2022-02-11 DIAGNOSIS — K219 Gastro-esophageal reflux disease without esophagitis: Secondary | ICD-10-CM

## 2022-02-11 DIAGNOSIS — F0393 Unspecified dementia, unspecified severity, with mood disturbance: Secondary | ICD-10-CM | POA: Diagnosis not present

## 2022-02-11 DIAGNOSIS — I2510 Atherosclerotic heart disease of native coronary artery without angina pectoris: Secondary | ICD-10-CM | POA: Diagnosis not present

## 2022-02-11 DIAGNOSIS — D631 Anemia in chronic kidney disease: Secondary | ICD-10-CM

## 2022-02-11 DIAGNOSIS — F32A Depression, unspecified: Secondary | ICD-10-CM | POA: Diagnosis not present

## 2022-02-11 DIAGNOSIS — R7303 Prediabetes: Secondary | ICD-10-CM

## 2022-02-11 DIAGNOSIS — I700 Atherosclerosis of aorta: Secondary | ICD-10-CM

## 2022-02-24 ENCOUNTER — Encounter: Payer: Medicare HMO | Admitting: Orthopedic Surgery

## 2022-03-02 IMAGING — US US EXTREM LOW VENOUS*R*
1 series · 14 of 24 positions shown · non-contrast
Comparison: None.

CLINICAL DATA: Right calf pain

EXAM:
RIGHT LOWER EXTREMITY VENOUS DOPPLER ULTRASOUND
TECHNIQUE: Gray-scale sonography with compression, as well as color and duplex
ultrasound, were performed to evaluate the deep venous system(s)
from the level of the common femoral vein through the popliteal and
proximal calf veins.

[Series 1: us extrem low venous*right* · 0.07mm/px · 14 of 49 slices shown]
[im 1/49]
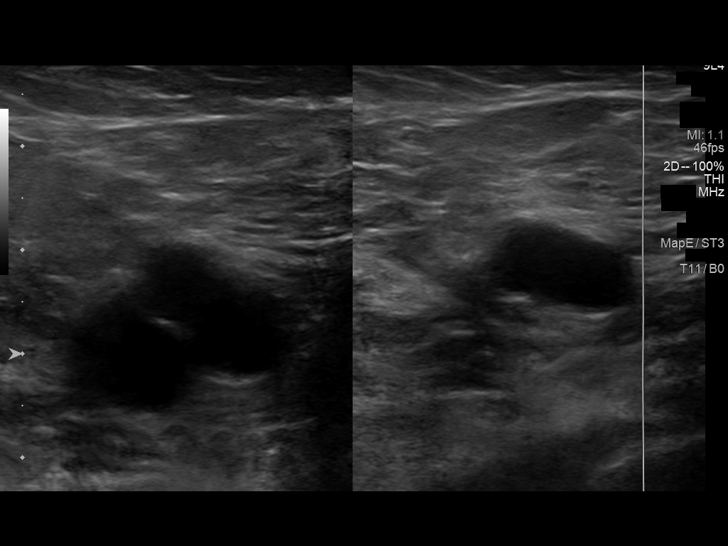
[im 5/49]
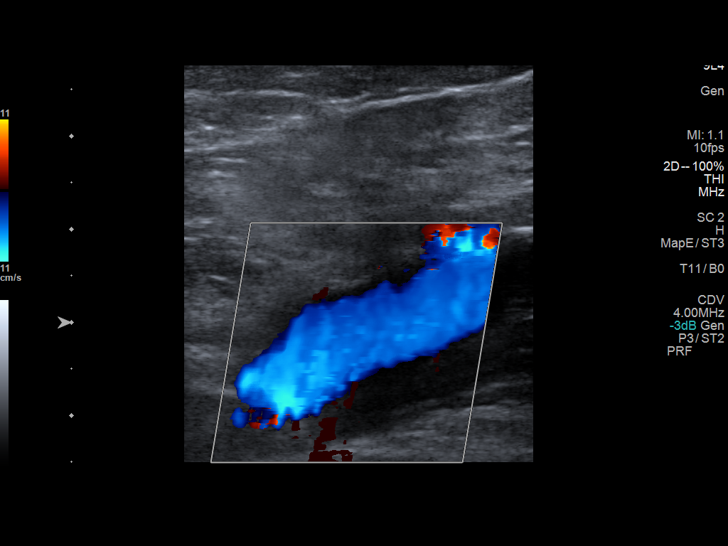
[im 9/49]
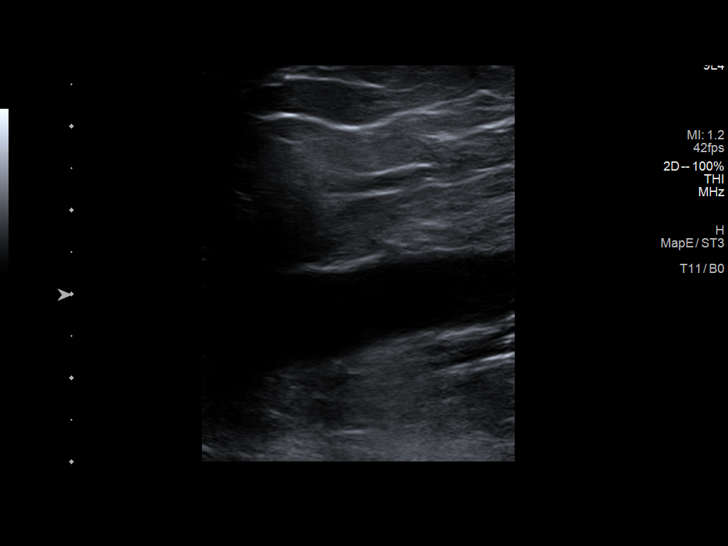
[im 13/49]
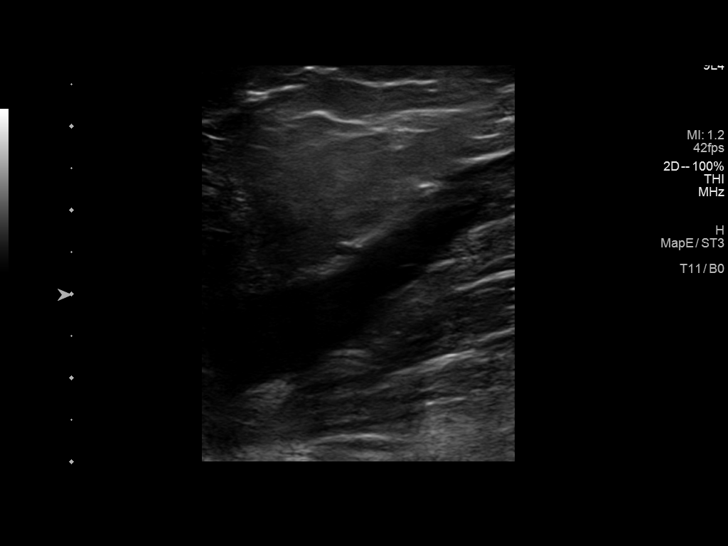
[im 15/49]
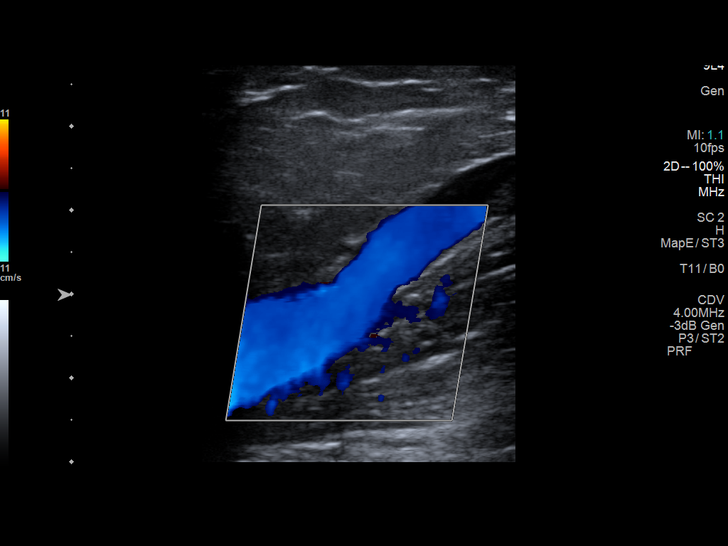
[im 19/49]
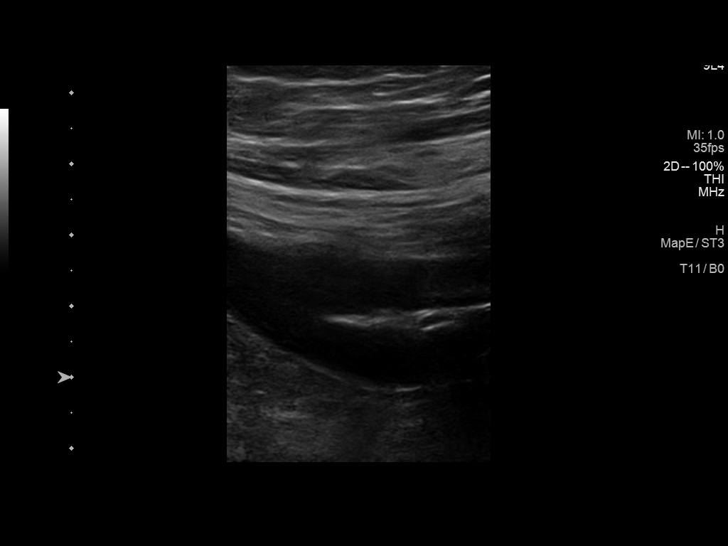
[im 23/49]
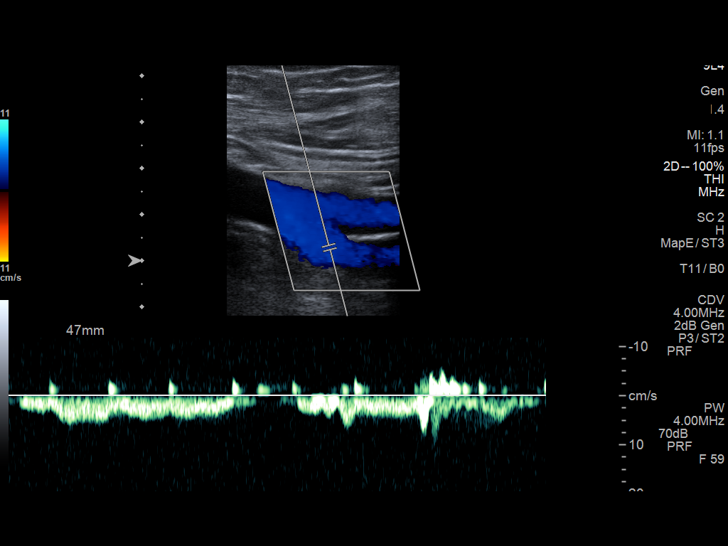
[im 26/49]
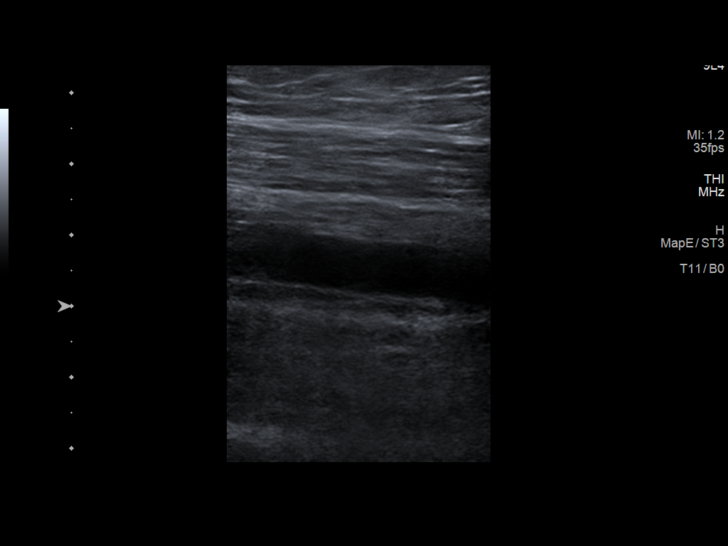
[im 30/49]
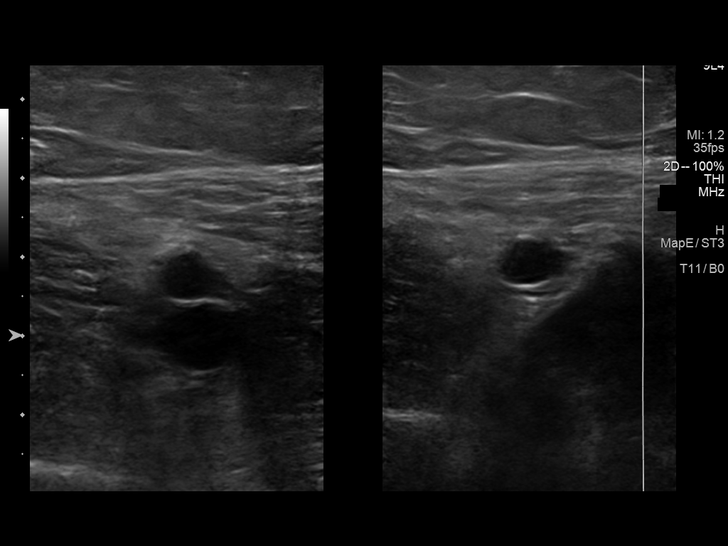
[im 34/49]
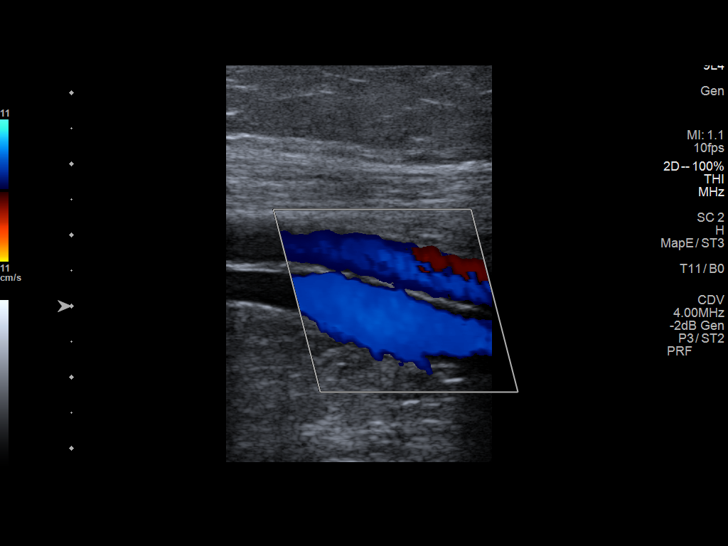
[im 38/49]
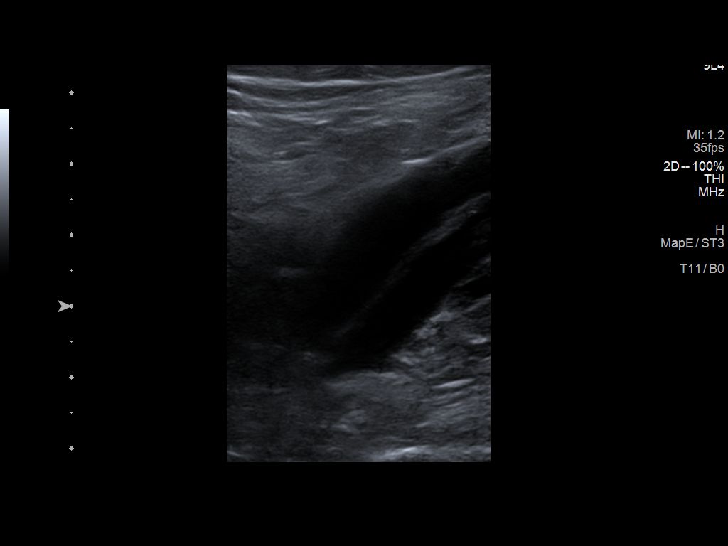
[im 40/49]
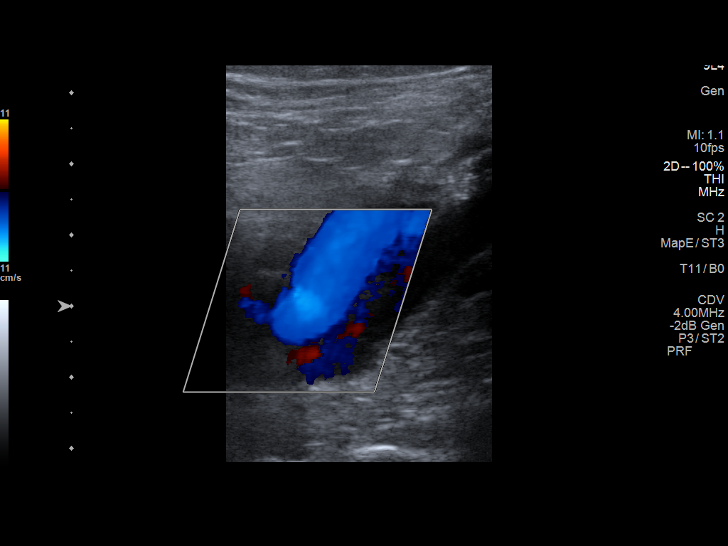
[im 44/49]
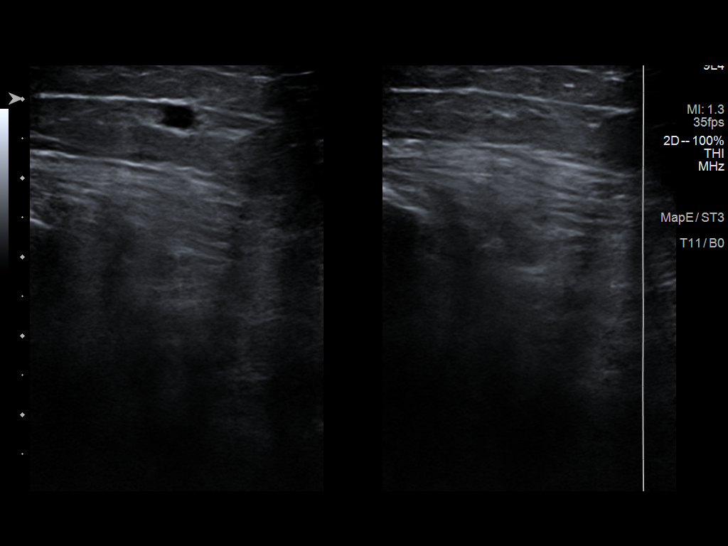
[im 49/49]
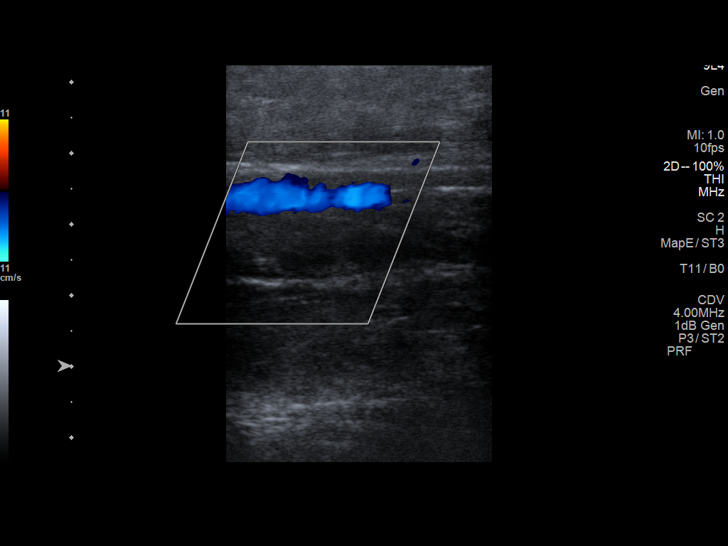

[14 of 24 positions shown; findings below may reference images not displayed]

FINDINGS: VENOUS

Normal compressibility of the common femoral, superficial femoral,
and popliteal veins, as well as the visualized calf veins.
Visualized portions of profunda femoral vein and great saphenous
vein unremarkable. No filling defects to suggest DVT on grayscale or
color Doppler imaging. Doppler waveforms show normal direction of
venous flow, normal respiratory phasicity and response to
augmentation.

Limited views of the contralateral common femoral vein are
unremarkable.

OTHER

None.

Limitations: none
IMPRESSION: No femoropopliteal DVT nor evidence of DVT within the visualized
calf veins.

If clinical symptoms are inconsistent or if there are persistent or
worsening symptoms, further imaging (possibly involving the iliac
veins) may be warranted.

## 2022-03-05 ENCOUNTER — Emergency Department (HOSPITAL_COMMUNITY)
Admission: EM | Admit: 2022-03-05 | Discharge: 2022-03-05 | Disposition: A | Discharge status: Home / Self Care | Payer: Medicare Other | Attending: Emergency Medicine | Admitting: Emergency Medicine

## 2022-03-05 ENCOUNTER — Emergency Department (HOSPITAL_COMMUNITY): Payer: Medicare Other

## 2022-03-05 ENCOUNTER — Encounter (HOSPITAL_COMMUNITY): Payer: Self-pay | Admitting: Emergency Medicine

## 2022-03-05 ENCOUNTER — Other Ambulatory Visit: Payer: Self-pay

## 2022-03-05 DIAGNOSIS — G4489 Other headache syndrome: Secondary | ICD-10-CM | POA: Diagnosis not present

## 2022-03-05 DIAGNOSIS — G8911 Acute pain due to trauma: Secondary | ICD-10-CM | POA: Diagnosis not present

## 2022-03-05 DIAGNOSIS — M79641 Pain in right hand: Secondary | ICD-10-CM | POA: Diagnosis not present

## 2022-03-05 DIAGNOSIS — Y92009 Unspecified place in unspecified non-institutional (private) residence as the place of occurrence of the external cause: Secondary | ICD-10-CM | POA: Diagnosis not present

## 2022-03-05 DIAGNOSIS — R52 Pain, unspecified: Secondary | ICD-10-CM | POA: Diagnosis not present

## 2022-03-05 DIAGNOSIS — W19XXXA Unspecified fall, initial encounter: Secondary | ICD-10-CM | POA: Insufficient documentation

## 2022-03-05 DIAGNOSIS — F0390 Unspecified dementia without behavioral disturbance: Secondary | ICD-10-CM | POA: Insufficient documentation

## 2022-03-05 DIAGNOSIS — R609 Edema, unspecified: Secondary | ICD-10-CM | POA: Diagnosis not present

## 2022-03-05 NOTE — ED Triage Notes (Signed)
Pt came by EMS from home. Pt is complaining of thumb pain from fall last night. Pt is a hospice patient. Pt is pleasantly confused and hard of hearing. Pt family concerned about thumb being broken (right).  ?

## 2022-03-05 NOTE — Discharge Instructions (Signed)
Imaging revealed no signs of fracture over the hand which is great news.  Please follow-up with your primary care provider or return to the emergency department for any worsening symptoms you might have. ?

## 2022-03-05 NOTE — ED Provider Notes (Signed)
?  Face-to-face evaluation ? ? ?History: Patient here by EMS for evaluation of right hand/thumb injury.  She was walking in hallway with family members and stumbled forward.  No other injuries. ? ?Physical exam: Elderly alert calm cooperative.  She is friendly and communicative.  No dysarthria or aphasia.  Right thumb and first MCP region are tender with slight swelling.  No deformity. ? ?MDM: Evaluation for  ?Chief Complaint  ?Patient presents with  ? Fall  ?  ? ?Fall likely mechanical with minor injury to hand and thumb.  No fractures noted.  No indication for further treatment or evaluation. ? ?Medical screening examination/treatment/procedure(s) were conducted as a shared visit with non-physician practitioner(s) and myself.  I personally evaluated the patient during the encounter ? ?  ?Daleen Bo, MD ?03/06/22 1102 ? ?

## 2022-03-05 NOTE — ED Provider Notes (Signed)
?[place] ?Provider Note ? ? ?CSN: [id number] ?Arrival date & time: 03/05/22  1659 ? ? LEVEL 5 CAVEAT: DEMENTIA ? ?History ?Chief Complaint  ?Patient presents with  ? Fall  ? ? ?Mallory Steele is a 88 y.o. female with history of dementia under hospice care who presents to the emergency department with right hand pain after a fall last night.  Family at bedside states that they return to ER to go to the bathroom and she slammed her head against a Personal assistant.  They came to the ED to make sure is not broken.  She denies any other injuries. ? ? ?Fall ? ? ?  ? ?Home Medications ?Prior to Admission medications   ?Medication Sig Start Date End Date Taking? Authorizing Provider  ?DULoxetine (CYMBALTA) 30 MG capsule Take 1 capsule (30 mg total) by mouth daily. 02/05/22   Dettinger, Fransisca Kaufmann, MD  ?Rexene Edison DM MAX 20-400 MG/20ML LIQD Take 5 mLs by mouth every 4 (four) hours as needed (mucas releif). 01/13/22   [provider]  ?omeprazole (PRILOSEC) 20 MG capsule Take 1 capsule (20 mg total) by mouth daily. 02/05/22   Dettinger, Fransisca Kaufmann, MD  ?oxyCODONE-acetaminophen (PERCOCET) 5-325 MG tablet Take 1 tablet by mouth 2 (two) times daily as needed for severe pain. 02/05/22 02/05/23  Dettinger, Fransisca Kaufmann, MD  ?oxyCODONE-acetaminophen (PERCOCET) 5-325 MG tablet Take 1 tablet by mouth 2 (two) times daily as needed for severe pain. 03/07/22 03/07/23  Dettinger, Fransisca Kaufmann, MD  ?traZODone (DESYREL) 50 MG tablet Take 1 tablet (50 mg total) by mouth at bedtime as needed. for sleep 02/05/22   Dettinger, Fransisca Kaufmann, MD  ?vitamin B-12 (CYANOCOBALAMIN) 250 MCG tablet TAKE 1 TABLET BY MOUTH EVERY DAY 04/17/19   Dettinger, Fransisca Kaufmann, MD  ?   ? ?Allergies    ?Patient has no known allergies.   ? ?Review of Systems   ?Review of Systems  ?Unable to perform ROS: Dementia  ? ?Physical Exam ?Updated Vital Signs ?BP 136/64 (BP Location: Right Arm)   Pulse (!) 101   Temp 100.1 ?F (37.8 ?C) (Oral)   Resp 16   Ht '5\' 4"'$   (1.626 m)   Wt 66.7 kg   SpO2 99%   BMI 25.24 kg/m?  ?Physical Exam ?Vitals and nursing note reviewed.  ?Constitutional:   ?   Appearance: Normal appearance.  ?HENT:  ?   Head: Normocephalic and atraumatic.  ?Eyes:  ?   General:     ?   Right eye: No discharge.     ?   Left eye: No discharge.  ?   Conjunctiva/sclera: Conjunctivae normal.  ?Pulmonary:  ?   Effort: Pulmonary effort is normal.  ?Musculoskeletal:  ?   Comments: Strong radial pulse felt on the right.  Good cap refill distally.  Tenderness over the right first finger.  ?Skin: ?   General: Skin is warm and dry.  ?   Findings: No rash.  ?Neurological:  ?   General: No focal deficit present.  ?   Mental Status: She is alert.  ?Psychiatric:     ?   Mood and Affect: Mood normal.     ?   Behavior: Behavior normal.  ? ? ?ED Results / Procedures / Treatments   ?Labs ?(all labs ordered are listed, but only abnormal results are displayed) ?Labs Reviewed - No data to display ? ?EKG ?None ? ?Radiology ?DG Hand Complete Right ? ?Result Date: 03/05/2022 ?CLINICAL DATA:  Right  hand pain after fall. Attempted to break fall with outstretched hand. EXAM: RIGHT HAND - COMPLETE 3+ VIEW COMPARISON:  None. FINDINGS: There is diffuse decreased bone mineralization. Joint space narrowing is severe at the triscaphe joint, thumb carpometacarpal joint, thumb metacarpophalangeal joint, index finger PIP and DIP joints, third and fourth finger DIP greater than PIP joints, and fifth finger PIP greater than DIP joints. There is also peripheral osteophytosis degenerative changes at these joints. Moderate distal radioulnar joint space narrowing and peripheral osteophytosis. There is mild lateral apex angulation of the index finger PIP joint secondary to degenerative change. There is mild volar subluxation of the thumb proximal phalanx with respect to the metacarpal head. No definite acute fracture is seen.  No dislocation. IMPRESSION: Severe osteoarthritis, as described above. No acute  fracture is seen. Electronically Signed   By: Yvonne Kendall M.D.   On: 03/05/2022 18:01   ? ?Procedures ?Procedures  ? ? ?Medications Ordered in ED ?Medications - No data to display ? ?ED Course/ Medical Decision Making/ A&P ?Clinical Course as of 03/05/22 1813  ?Fri Mar 05, 2022  ?1811 I discussed this case with my attending physician who cosigned this note including patient's presenting symptoms, physical exam, and planned diagnostics and interventions. Attending physician stated agreement with plan or made changes to plan which were implemented.  ? ?Attending physician assessed patient at bedside. ? ? [CF]  ?  ?Clinical Course User Index ?[CF] Myna Bright M, PA-C  ? ?                        ?Medical Decision Making ?Amount and/or Complexity of Data Reviewed ?Radiology: ordered. ? ? ?This patient presents to the ED for concern of right hand pain after a fall, this involves an extensive number of treatment options, and is a complaint that carries with it a high risk of complications and morbidity.  The differential diagnosis includes musculoskeletal strain, muscular contusion, fracture. ? ? ?Co morbidities that complicate the patient evaluation ? ?Past Medical History:  ?Diagnosis Date  ? Anxiety   ? Dementia ([place])   ? ? ?Additional history obtained: ? ?Additional history obtained from nursing note and family at bedside ? ?Lab Tests: ? ?I Ordered, and personally interpreted labs.  The pertinent results include: None ? ? ?Imaging Studies ordered: ? ?I ordered imaging studies including x-ray of the right hand ?I independently visualized and interpreted imaging which showed severe osteoarthritis without evidence of fracture. ?I agree with the radiologist interpretation ? ? ?Cardiac Monitoring: ? ?The patient was maintained on a cardiac monitor.  I personally viewed and interpreted the cardiac monitored which showed an underlying rhythm of: Normal sinus rhythm ? ? ?Medicines ordered and prescription drug  management: ? ?None ? ?Test Considered: ? ?N/A ? ? ?Critical Interventions: ? ?N/A ? ? ?Problem List / ED Course: ? ?Patient here with right hand pain after a fall.  No evidence of fracture.  Likely contusion.  Patient safe to go home.  She is safe for discharge. ? ? ?Reevaluation: ? ?After the interventions noted above, I reevaluated the patient and found that they have :improved ? ? ?Social Determinants of Health: ? ?Social Determinants of Health with Concerns  ? ?Tobacco Use: Medium Risk  ? Smoking Tobacco Use: Former  ? Smokeless Tobacco Use: Never  ? Passive Exposure: Not on file  ?Financial Resource Strain: Not on file  ?Food Insecurity: Not on file  ?Transportation Needs: Not on file  ?Physical Activity:  Inactive  ? Days of Exercise per Week: 0 days  ? Minutes of Exercise per Session: 0 min  ?Stress: Not on file  ?Social Connections: Not on file  ?Intimate Partner Violence: Not on file  ?Alcohol Screen: Not on file  ?Housing: Not on file  ? ? ?Dispostion: ? ?After consideration of the diagnostic results and the patients response to treatment, I feel that the patent would benefit from outpatient follow-up.  Family notified of all results at the bedside.  All question concern and addressed.  She is safe for discharge ? ?Final Clinical Impression(s) / ED Diagnoses ?Final diagnoses:  ?Right hand pain  ? ? ?Rx / DC Orders ?ED Discharge Orders   ? ? None  ? ?  ? ? ?  ?Hendricks Limes, PA-C ?03/05/22 1813 ? ?  ?Daleen Bo, MD ?03/06/22 1102 ? ?

## 2022-03-09 DIAGNOSIS — R52 Pain, unspecified: Secondary | ICD-10-CM | POA: Diagnosis not present

## 2022-03-09 DIAGNOSIS — I4891 Unspecified atrial fibrillation: Secondary | ICD-10-CM | POA: Diagnosis not present

## 2022-03-09 DIAGNOSIS — M79606 Pain in leg, unspecified: Secondary | ICD-10-CM | POA: Diagnosis not present

## 2022-03-09 DIAGNOSIS — R000 Tachycardia, unspecified: Secondary | ICD-10-CM | POA: Diagnosis not present

## 2022-03-10 ENCOUNTER — Encounter: Payer: Medicare HMO | Admitting: Orthopedic Surgery

## 2022-03-22 ENCOUNTER — Telehealth: Payer: Self-pay | Admitting: Family Medicine

## 2022-03-22 NOTE — Telephone Encounter (Signed)
Dettinger sent #60 on 3/17 and again on 4/16. Pt is supposed to be taking 1 tab bid. Pt should not be taking 4 per day. ? ?This medication is controlled ( oxycodone 5-'325mg'$ ) and cannot be filled outside of an office visit. Pt should have enough medication until 5/17.   Pt has an appt scheduled for 5/17 for a follow up. ?

## 2022-03-23 NOTE — Telephone Encounter (Signed)
Spoke with daughter, [name] this morning. She states that the pharmacy only gave #20 at her last refill. I did not confirm the number amount from Rx in March. However, [name] will call the pharmacy to see why the total #60 was not given on 4/15. ? ?Informed [name] that the pt should be only taking 2 per day. That 4 per day is too many and can increase risk for falls ( Pt recently had ED visit for fall).  [name] believes that the '5mg'$  of Oxycodone is not enough to control pts pain. Advised that this will need to be discussed during her follow up in 5/17. Pt did have PT but that is finished. Daughter states that the pt does not want to do much of anything now. Advised that Dettinger will discuss a plan to manage the pain when pt comes in for next appt. Daughter understood but seemed more concerned about getting more medication and increasing the strength. States that she will call pharmacy about getting the entire Rx. Strongly advised daughter to decrease to 2 tabs per day. ?

## 2022-04-07 ENCOUNTER — Ambulatory Visit: Payer: Medicare HMO | Admitting: Family Medicine

## 2022-04-08 ENCOUNTER — Encounter: Payer: Self-pay | Admitting: Family Medicine

## 2022-07-23 DEATH — deceased
